# Patient Record
Sex: Female | Born: 1970 | Race: White | Hispanic: No | Smoking: Never smoker
Health system: Southern US, Community
[De-identification: ages and names within clinical notes are randomized; demographics above are authoritative.]

## PROBLEM LIST (undated history)

## (undated) DIAGNOSIS — R51 Headache: Secondary | ICD-10-CM

## (undated) DIAGNOSIS — Z1371 Encounter for nonprocreative screening for genetic disease carrier status: Secondary | ICD-10-CM

## (undated) DIAGNOSIS — F988 Other specified behavioral and emotional disorders with onset usually occurring in childhood and adolescence: Secondary | ICD-10-CM

## (undated) DIAGNOSIS — K5792 Diverticulitis of intestine, part unspecified, without perforation or abscess without bleeding: Secondary | ICD-10-CM

## (undated) DIAGNOSIS — Z803 Family history of malignant neoplasm of breast: Secondary | ICD-10-CM

## (undated) DIAGNOSIS — K219 Gastro-esophageal reflux disease without esophagitis: Secondary | ICD-10-CM

## (undated) DIAGNOSIS — F419 Anxiety disorder, unspecified: Secondary | ICD-10-CM

## (undated) DIAGNOSIS — G4730 Sleep apnea, unspecified: Secondary | ICD-10-CM

## (undated) DIAGNOSIS — K635 Polyp of colon: Secondary | ICD-10-CM

## (undated) DIAGNOSIS — F329 Major depressive disorder, single episode, unspecified: Secondary | ICD-10-CM

## (undated) DIAGNOSIS — F32A Depression, unspecified: Secondary | ICD-10-CM

## (undated) DIAGNOSIS — D649 Anemia, unspecified: Secondary | ICD-10-CM

## (undated) DIAGNOSIS — R519 Headache, unspecified: Secondary | ICD-10-CM

## (undated) DIAGNOSIS — G43909 Migraine, unspecified, not intractable, without status migrainosus: Secondary | ICD-10-CM

## (undated) DIAGNOSIS — K429 Umbilical hernia without obstruction or gangrene: Secondary | ICD-10-CM

## (undated) DIAGNOSIS — K5790 Diverticulosis of intestine, part unspecified, without perforation or abscess without bleeding: Secondary | ICD-10-CM

## (undated) DIAGNOSIS — Z9889 Other specified postprocedural states: Secondary | ICD-10-CM

## (undated) DIAGNOSIS — Z87898 Personal history of other specified conditions: Secondary | ICD-10-CM

## (undated) DIAGNOSIS — K589 Irritable bowel syndrome without diarrhea: Secondary | ICD-10-CM

## (undated) DIAGNOSIS — K56609 Unspecified intestinal obstruction, unspecified as to partial versus complete obstruction: Secondary | ICD-10-CM

## (undated) DIAGNOSIS — G8929 Other chronic pain: Secondary | ICD-10-CM

## (undated) DIAGNOSIS — M4316 Spondylolisthesis, lumbar region: Secondary | ICD-10-CM

## (undated) DIAGNOSIS — M5030 Other cervical disc degeneration, unspecified cervical region: Secondary | ICD-10-CM

## (undated) DIAGNOSIS — I10 Essential (primary) hypertension: Secondary | ICD-10-CM

## (undated) DIAGNOSIS — J189 Pneumonia, unspecified organism: Secondary | ICD-10-CM

## (undated) DIAGNOSIS — R112 Nausea with vomiting, unspecified: Secondary | ICD-10-CM

## (undated) DIAGNOSIS — M1990 Unspecified osteoarthritis, unspecified site: Secondary | ICD-10-CM

## (undated) HISTORY — DX: Family history of malignant neoplasm of breast: Z80.3

## (undated) HISTORY — DX: Headache: R51

## (undated) HISTORY — DX: Diverticulitis of intestine, part unspecified, without perforation or abscess without bleeding: K57.92

## (undated) HISTORY — PX: CARPAL TUNNEL RELEASE: SHX101

## (undated) HISTORY — DX: Encounter for nonprocreative screening for genetic disease carrier status: Z13.71

## (undated) HISTORY — PX: WRIST SURGERY: SHX841

## (undated) HISTORY — DX: Headache, unspecified: R51.9

## (undated) HISTORY — DX: Polyp of colon: K63.5

## (undated) HISTORY — PX: HAND SURGERY: SHX662

## (undated) HISTORY — PX: OTHER SURGICAL HISTORY: SHX169

## (undated) HISTORY — DX: Unspecified intestinal obstruction, unspecified as to partial versus complete obstruction: K56.609

## (undated) HISTORY — DX: Other chronic pain: G89.29

## (undated) HISTORY — PX: COLONOSCOPY: SHX174

---

## 2000-11-08 HISTORY — PX: ABDOMINAL HYSTERECTOMY: SHX81

## 2001-06-07 ENCOUNTER — Ambulatory Visit (HOSPITAL_BASED_OUTPATIENT_CLINIC_OR_DEPARTMENT_OTHER): Admission: RE | Admit: 2001-06-07 | Discharge: 2001-06-07 | Payer: Self-pay | Admitting: Orthopedic Surgery

## 2001-06-12 ENCOUNTER — Emergency Department (HOSPITAL_COMMUNITY): Admission: EM | Admit: 2001-06-12 | Discharge: 2001-06-12 | Payer: Self-pay | Admitting: Emergency Medicine

## 2005-02-08 ENCOUNTER — Emergency Department: Payer: Self-pay | Admitting: Emergency Medicine

## 2005-03-17 ENCOUNTER — Emergency Department: Payer: Self-pay | Admitting: Emergency Medicine

## 2005-05-17 ENCOUNTER — Emergency Department: Payer: Self-pay | Admitting: Emergency Medicine

## 2006-04-07 ENCOUNTER — Emergency Department: Payer: Self-pay | Admitting: Emergency Medicine

## 2006-06-03 ENCOUNTER — Emergency Department: Payer: Self-pay | Admitting: Emergency Medicine

## 2006-08-27 ENCOUNTER — Emergency Department: Payer: Self-pay | Admitting: Emergency Medicine

## 2009-02-13 HISTORY — PX: ESOPHAGOGASTRODUODENOSCOPY: SHX1529

## 2011-01-21 ENCOUNTER — Emergency Department: Payer: Self-pay | Admitting: Emergency Medicine

## 2011-09-16 ENCOUNTER — Emergency Department: Payer: Self-pay | Admitting: Emergency Medicine

## 2012-04-05 ENCOUNTER — Emergency Department (HOSPITAL_BASED_OUTPATIENT_CLINIC_OR_DEPARTMENT_OTHER): Admission: EM | Admit: 2012-04-05 | Discharge: 2012-04-05 | Discharge status: Against Medical Advice | Payer: Self-pay

## 2012-04-05 ENCOUNTER — Emergency Department (INDEPENDENT_AMBULATORY_CARE_PROVIDER_SITE_OTHER): Payer: Self-pay

## 2012-04-05 ENCOUNTER — Encounter (HOSPITAL_BASED_OUTPATIENT_CLINIC_OR_DEPARTMENT_OTHER): Payer: Self-pay

## 2012-04-05 ENCOUNTER — Emergency Department (HOSPITAL_BASED_OUTPATIENT_CLINIC_OR_DEPARTMENT_OTHER)
Admission: EM | Admit: 2012-04-05 | Discharge: 2012-04-05 | Disposition: A | Discharge status: Home / Self Care | Payer: Self-pay | Attending: Emergency Medicine | Admitting: Emergency Medicine

## 2012-04-05 DIAGNOSIS — M542 Cervicalgia: Secondary | ICD-10-CM | POA: Insufficient documentation

## 2012-04-05 DIAGNOSIS — J45909 Unspecified asthma, uncomplicated: Secondary | ICD-10-CM | POA: Insufficient documentation

## 2012-04-05 DIAGNOSIS — I10 Essential (primary) hypertension: Secondary | ICD-10-CM | POA: Insufficient documentation

## 2012-04-05 DIAGNOSIS — M5030 Other cervical disc degeneration, unspecified cervical region: Secondary | ICD-10-CM

## 2012-04-05 DIAGNOSIS — S161XXA Strain of muscle, fascia and tendon at neck level, initial encounter: Secondary | ICD-10-CM

## 2012-04-05 DIAGNOSIS — M436 Torticollis: Secondary | ICD-10-CM

## 2012-04-05 DIAGNOSIS — X58XXXA Exposure to other specified factors, initial encounter: Secondary | ICD-10-CM | POA: Insufficient documentation

## 2012-04-05 HISTORY — DX: Gastro-esophageal reflux disease without esophagitis: K21.9

## 2012-04-05 HISTORY — DX: Depression, unspecified: F32.A

## 2012-04-05 HISTORY — DX: Essential (primary) hypertension: I10

## 2012-04-05 HISTORY — DX: Anxiety disorder, unspecified: F41.9

## 2012-04-05 HISTORY — DX: Major depressive disorder, single episode, unspecified: F32.9

## 2012-04-05 HISTORY — DX: Diverticulosis of intestine, part unspecified, without perforation or abscess without bleeding: K57.90

## 2012-04-05 HISTORY — DX: Irritable bowel syndrome, unspecified: K58.9

## 2012-04-05 MED ORDER — HYDROCODONE-ACETAMINOPHEN 5-500 MG PO TABS: 1.0000 | ORAL_TABLET | Freq: Four times a day (QID) | ORAL | Status: AC | PRN

## 2012-04-05 MED ORDER — CYCLOBENZAPRINE HCL 5 MG PO TABS: 5.0000 mg | ORAL_TABLET | Freq: Two times a day (BID) | ORAL | Status: AC | PRN

## 2012-04-05 NOTE — Discharge Instructions (Signed)

## 2012-04-05 NOTE — ED Provider Notes (Signed)
History     CSN: [id number]  Arrival date & time 04/05/12  1440   First MD Initiated Contact with Patient 04/05/12 1508      Chief Complaint  Patient presents with  . Neck Pain    (Consider location/radiation/quality/duration/timing/severity/associated sxs/prior treatment) HPI Comments: Pt states that she had a neck injury several years ago and she had had a flare up recently  Patient is a 41 y.o. female presenting with neck pain. The history is provided by the patient. No language interpreter was used.  Neck Pain  This is a recurrent problem. The current episode started more than 1 week ago. The problem occurs constantly. The problem has not changed since onset.The pain is associated with nothing. There has been no fever. The pain is present in the generalized neck. The quality of the pain is described as aching. The pain is moderate. The symptoms are aggravated by bending and twisting. The pain is the same all the time. Pertinent negatives include no numbness, no tingling and no weakness.    Past Medical History  Diagnosis Date  . Hypertension   . Asthma   . Depression   . Anxiety   . GERD (gastroesophageal reflux disease)   . IBS (irritable bowel syndrome)   . Diverticulosis     Past Surgical History  Procedure Date  . Wrist surgery   . Abdominal hysterectomy   . Cesarean section     No family history on file.  History  Substance Use Topics  . Smoking status: Never Smoker   . Smokeless tobacco: Not on file  . Alcohol Use: No    OB History    Grav Para Term Preterm Abortions TAB SAB Ect Mult Living                  Review of Systems  Constitutional: Negative.   HENT: Positive for neck pain.   Eyes: Negative.   Respiratory: Negative.   Cardiovascular: Negative.   Neurological: Negative for tingling, weakness and numbness.    Allergies  Morphine and related  Home Medications   Current Outpatient Rx  Name Route Sig Dispense Refill  . ALBUTEROL  SULFATE IN Inhalation Inhale into the lungs.    . BUPROPION HCL ER (XL) 300 MG PO TB24 Oral Take 300 mg by mouth daily.    Marland Kitchen LOSARTAN POTASSIUM-HCTZ 50-12.5 MG PO TABS Oral Take 1 tablet by mouth daily.    . METHYLPHENIDATE HCL ER 54 MG PO TBCR Oral Take 54 mg by mouth every morning.    Marland Kitchen ONDANSETRON 4 MG PO TBDP Oral Take 4 mg by mouth every 8 (eight) hours as needed.    Marland Kitchen ONDANSETRON 8 MG PO TBDP Oral Take 8 mg by mouth every 8 (eight) hours as needed.      BP 134/85  Pulse 87  Temp(Src) 98.2 F (36.8 C) (Oral)  Resp 16  Ht 4\' 11"  (1.499 m)  Wt 165 lb (74.844 kg)  BMI 33.33 kg/m2  SpO2 100%  Physical Exam  Nursing note and vitals reviewed. Constitutional: She is oriented to person, place, and time. She appears well-developed and well-nourished.  HENT:  Head: Normocephalic and atraumatic.  Eyes: Conjunctivae and EOM are normal. Pupils are equal, round, and reactive to light.  Neck: Normal range of motion. Neck supple.  Cardiovascular: Normal rate and regular rhythm.   Pulmonary/Chest: Effort normal and breath sounds normal.  Musculoskeletal: Normal range of motion.       Cervical back: She exhibits  tenderness and bony tenderness. She exhibits normal range of motion.  Neurological: She is oriented to person, place, and time.  Skin: Skin is warm and dry.    ED Course  Procedures (including critical care time)  Labs Reviewed - No data to display Dg Cervical Spine Complete  04/05/2012  *RADIOLOGY REPORT*  Clinical Data: Posterior neck pain and stiffness, no acute injury  CERVICAL SPINE - COMPLETE 4+ VIEW  Comparison: None.  Findings: The cervical vertebrae are slightly straightened in alignment.  There is degenerative disc disease at C6-7 where there is loss of disc space and spurring with sclerosis.  Very slight loss of disc space is noted at C5-6.  There is minimal anterolisthesis of C4 on C5 of questionable significance. There is some foraminal narrowing the C6-7 level.  The  odontoid process is intact.  The lung apices are clear.  IMPRESSION: Slightly straightened alignment with degenerative disc disease particularly at C6-7 with some foraminal narrowing at this level.  Original Report Authenticated By: Juline Patch, M.D.     1. Cervical strain       MDM  Pt not having any neuro deficits;pt is okay to follow up for continued symptoms:will treat symptomatically        Teressa Lower, NP 04/05/12 1554

## 2012-04-05 NOTE — ED Notes (Signed)
C/o neckpain x "couple weeks"-worse since 12pm-hx of "neck sprain in 2008"-no recent injury

## 2012-04-05 NOTE — ED Provider Notes (Signed)
Medical screening examination/treatment/procedure(s) were performed by non-physician practitioner and as supervising physician I was immediately available for consultation/collaboration.   Casmer Yepiz, MD 04/05/12 1751 

## 2013-01-27 DIAGNOSIS — Z1371 Encounter for nonprocreative screening for genetic disease carrier status: Secondary | ICD-10-CM

## 2013-01-27 HISTORY — DX: Encounter for nonprocreative screening for genetic disease carrier status: Z13.71

## 2015-11-10 ENCOUNTER — Other Ambulatory Visit (HOSPITAL_COMMUNITY): Payer: Self-pay | Admitting: Neurological Surgery

## 2015-12-03 NOTE — Pre-Procedure Instructions (Signed)
Christina Potts  12/03/2015     Your procedure is scheduled on : Wednesday December 10, 2015 at 1:00 PM.  [facility] at 11:00 AM.  Call this number if you have problems the morning of surgery: [phone]    Remember:  Do not eat food or drink liquids after midnight.  Take these medicines the morning of surgery with A SIP OF WATER : Bupropion (Wellbutrin), Diazepam (Valium) if needed, Oxycodone if needed   Stop taking any vitamins, herbal medications, Ibuprofen, Advil, Motrin, Aleve, etc   Do not wear jewelry, make-up or nail polish.  Do not wear lotions, powders, or perfumes.    Do not shave 48 hours prior to surgery.    Do not bring valuables to the hospital.  [facility] is not responsible for any belongings or valuables.  Contacts, dentures or bridgework may not be worn into surgery.  Leave your suitcase in the car.  After surgery it may be brought to your room.  For patients admitted to the hospital, discharge time will be determined by your treatment team.  Patients discharged the day of surgery will not be allowed to drive home.   Name and phone number of your driver:    Special instructions:  Shower using CHG soap the night before and the morning of your surgery  Please read over the following fact sheets that you were given. Pain Booklet, Coughing and Deep Breathing, MRSA Information and Surgical Site Infection Prevention

## 2015-12-04 ENCOUNTER — Encounter (HOSPITAL_BASED_OUTPATIENT_CLINIC_OR_DEPARTMENT_OTHER): Payer: Self-pay

## 2015-12-04 ENCOUNTER — Encounter (HOSPITAL_COMMUNITY)
Admission: RE | Admit: 2015-12-04 | Discharge: 2015-12-04 | Disposition: A | Discharge status: Home / Self Care | Payer: Managed Care, Other (non HMO) | Source: Ambulatory Visit | Attending: Neurological Surgery | Admitting: Neurological Surgery

## 2015-12-04 ENCOUNTER — Ambulatory Visit (HOSPITAL_COMMUNITY)
Admission: RE | Admit: 2015-12-04 | Discharge: 2015-12-04 | Disposition: A | Discharge status: Home / Self Care | Payer: Managed Care, Other (non HMO) | Source: Ambulatory Visit | Attending: Neurological Surgery | Admitting: Neurological Surgery

## 2015-12-04 ENCOUNTER — Encounter (HOSPITAL_COMMUNITY): Payer: Self-pay

## 2015-12-04 DIAGNOSIS — Z01818 Encounter for other preprocedural examination: Secondary | ICD-10-CM | POA: Insufficient documentation

## 2015-12-04 DIAGNOSIS — M479 Spondylosis, unspecified: Secondary | ICD-10-CM | POA: Insufficient documentation

## 2015-12-04 DIAGNOSIS — R9431 Abnormal electrocardiogram [ECG] [EKG]: Secondary | ICD-10-CM | POA: Insufficient documentation

## 2015-12-04 DIAGNOSIS — Z01812 Encounter for preprocedural laboratory examination: Secondary | ICD-10-CM | POA: Diagnosis not present

## 2015-12-04 DIAGNOSIS — Z01810 Encounter for preprocedural cardiovascular examination: Secondary | ICD-10-CM | POA: Insufficient documentation

## 2015-12-04 DIAGNOSIS — M5030 Other cervical disc degeneration, unspecified cervical region: Secondary | ICD-10-CM | POA: Diagnosis not present

## 2015-12-04 HISTORY — DX: Other specified behavioral and emotional disorders with onset usually occurring in childhood and adolescence: F98.8

## 2015-12-04 HISTORY — DX: Unspecified osteoarthritis, unspecified site: M19.90

## 2015-12-04 HISTORY — DX: Nausea with vomiting, unspecified: R11.2

## 2015-12-04 HISTORY — DX: Personal history of other specified conditions: Z87.898

## 2015-12-04 HISTORY — DX: Other specified postprocedural states: Z98.890

## 2015-12-04 LAB — BASIC METABOLIC PANEL
Anion gap: 8 (ref 5–15)
BUN: 12 mg/dL (ref 6–20)
CALCIUM: 9.1 mg/dL (ref 8.9–10.3)
CO2: 29 mmol/L (ref 22–32)
CREATININE: 1.01 mg/dL — AB (ref 0.44–1.00)
Chloride: 102 mmol/L (ref 101–111)
GFR calc non Af Amer: >60 mL/min (ref >60)
Glucose, Bld: 94 mg/dL (ref 65–99)
Potassium: 3.8 mmol/L (ref 3.5–5.1)
SODIUM: 139 mmol/L (ref 135–145)

## 2015-12-04 LAB — CBC WITH DIFFERENTIAL/PLATELET
BASOS PCT: 0 %
Basophils Absolute: 0 10*3/uL (ref 0.0–0.1)
EOS ABS: 0.1 10*3/uL (ref 0.0–0.7)
Eosinophils Relative: 1 %
HCT: 44 % (ref 36.0–46.0)
HEMOGLOBIN: 14.3 g/dL (ref 12.0–15.0)
Lymphocytes Relative: 26 %
Lymphs Abs: 1.6 10*3/uL (ref 0.7–4.0)
MCH: 30.4 pg (ref 26.0–34.0)
MCHC: 32.5 g/dL (ref 30.0–36.0)
MCV: 93.6 fL (ref 78.0–100.0)
Monocytes Absolute: 0.4 10*3/uL (ref 0.1–1.0)
Monocytes Relative: 7 %
NEUTROS PCT: 66 %
Neutro Abs: 4.1 10*3/uL (ref 1.7–7.7)
Platelets: 254 10*3/uL (ref 150–400)
RBC: 4.7 MIL/uL (ref 3.87–5.11)
RDW: 12.5 % (ref 11.5–15.5)
WBC: 6.3 10*3/uL (ref 4.0–10.5)

## 2015-12-04 LAB — SURGICAL PCR SCREEN
MRSA, PCR: NEGATIVE
Staphylococcus aureus: NEGATIVE

## 2015-12-04 LAB — PROTIME-INR
INR: 1.15 (ref 0.00–1.49)
PROTHROMBIN TIME: 14.9 s (ref 11.6–15.2)

## 2015-12-04 NOTE — Progress Notes (Signed)
Patient arrived to scheduled 0900 PAT appointment at 0939  PCP is Joen LauraLaura Bliss  Patient denied having any acute cardiac or pulmonary issues, but did inform Nurse that she had a cold "the week after Thanksgiving." Patient stated that she went to the doctor and was given a Z-Pack and she "feels better now." Patient last Albuterol inhaler when she had a cold a few weeks ago.

## 2015-12-05 ENCOUNTER — Encounter (HOSPITAL_COMMUNITY): Payer: Self-pay | Admitting: Emergency Medicine

## 2015-12-05 NOTE — Progress Notes (Signed)
Anesthesia Chart Review:  Pt is 45 year old female scheduled for 3 level ACDF on 12/10/2015 with Dr. Yetta BarreJones.   PMH includes:  HTN, asthma, GERD, post-op N/V. Never smoker. BMI 28.5.   Medications include: albuterol, losartan-hctz, methylphenidate.   Preoperative labs reviewed.    Chest x-ray 12/04/15 reviewed. No active cardiopulmonary disease.   EKG 12/04/15: NSR. Cannot rule out Anterior infarct, age undetermined (abnormal R wave progression may be related to lead Placement) per Dr. Erin Hearingroitoru's interpretation.   If no changes, I anticipate pt can proceed with surgery as scheduled.   Rica Mastngela Chaysen Tillman, FNP-BC [facility]Anesthesiology Phone: [phone] 12/05/2015 1:38 PM

## 2015-12-09 MED ORDER — CEFAZOLIN SODIUM-DEXTROSE 2-3 GM-% IV SOLR: 2.0000 g | INTRAVENOUS | Status: DC

## 2015-12-09 MED ORDER — DEXAMETHASONE SODIUM PHOSPHATE 10 MG/ML IJ SOLN: 10.0000 mg | INTRAMUSCULAR | Status: DC

## 2015-12-10 ENCOUNTER — Inpatient Hospital Stay (HOSPITAL_COMMUNITY)
Admission: RE | Admit: 2015-12-10 | Payer: Managed Care, Other (non HMO) | Source: Ambulatory Visit | Admitting: Neurological Surgery

## 2015-12-10 ENCOUNTER — Encounter (HOSPITAL_COMMUNITY): Admission: RE | Payer: Self-pay | Source: Ambulatory Visit

## 2015-12-10 SURGERY — ANTERIOR CERVICAL DECOMPRESSION/DISCECTOMY FUSION 3 LEVELS
Anesthesia: General

## 2015-12-15 ENCOUNTER — Other Ambulatory Visit: Payer: Self-pay | Admitting: Neurological Surgery

## 2015-12-15 DIAGNOSIS — M542 Cervicalgia: Secondary | ICD-10-CM

## 2015-12-17 ENCOUNTER — Inpatient Hospital Stay: Admission: RE | Admit: 2015-12-17 | Payer: Self-pay | Source: Ambulatory Visit

## 2015-12-19 ENCOUNTER — Other Ambulatory Visit: Payer: Self-pay | Admitting: Neurological Surgery

## 2015-12-19 DIAGNOSIS — M542 Cervicalgia: Secondary | ICD-10-CM

## 2015-12-24 ENCOUNTER — Ambulatory Visit
Admission: RE | Admit: 2015-12-24 | Discharge: 2015-12-24 | Disposition: A | Discharge status: Home / Self Care | Payer: 59 | Source: Ambulatory Visit | Attending: Neurological Surgery | Admitting: Neurological Surgery

## 2015-12-24 DIAGNOSIS — M542 Cervicalgia: Secondary | ICD-10-CM

## 2015-12-24 MED ORDER — DIAZEPAM 5 MG PO TABS
10.0000 mg | ORAL_TABLET | Freq: Once | ORAL | Status: AC
  Administered 2015-12-24: 10 mg via ORAL

## 2015-12-24 MED ORDER — MEPERIDINE HCL 100 MG/ML IJ SOLN
75.0000 mg | Freq: Once | INTRAMUSCULAR | Status: AC
  Administered 2015-12-24: 75 mg via INTRAMUSCULAR

## 2015-12-24 MED ORDER — ONDANSETRON HCL 4 MG/2ML IJ SOLN
4.0000 mg | Freq: Once | INTRAMUSCULAR | Status: AC
  Administered 2015-12-24: 4 mg via INTRAMUSCULAR

## 2015-12-24 MED ORDER — IOHEXOL 300 MG/ML  SOLN
10.0000 mL | Freq: Once | INTRAMUSCULAR | Status: AC | PRN
Start: 2015-12-24 — End: 2015-12-24
  Administered 2015-12-24: 10 mL via INTRATHECAL

## 2015-12-24 NOTE — Discharge Instructions (Addendum)
Myelogram Discharge Instructions  1. Go home and rest quietly for the next 24 hours.  It is important to lie flat for the next 24 hours.  Get up only to go to the restroom.  You may lie in the bed or on a couch on your back, your stomach, your left side or your right side.  You may have one pillow under your head.  You may have pillows between your knees while you are on your side or under your knees while you are on your back.  2. DO NOT drive today.  Recline the seat as far back as it will go, while still wearing your seat belt, on the way home.  3. You may get up to go to the bathroom as needed.  You may sit up for 10 minutes to eat.  You may resume your normal diet and medications unless otherwise indicated.  Drink lots of extra fluids today and tomorrow.  4. The incidence of headache, nausea, or vomiting is about 5% (one in 20 patients).  If you develop a headache, lie flat and drink plenty of fluids until the headache goes away.  Caffeinated beverages may be helpful.  If you develop severe nausea and vomiting or a headache that does not go away with flat bed rest, call [phone].  5. You may resume normal activities after your 24 hours of bed rest is over; however, do not exert yourself strongly or do any heavy lifting tomorrow. If when you get up you have a headache when standing, go back to bed and force fluids for another 24 hours.  6. Call your physician for a follow-up appointment.  The results of your myelogram will be sent directly to your physician by the following day.  7. If you have any questions or if complications develop after you arrive home, please call [phone].  Discharge instructions have been explained to the patient.  The patient, or the person responsible for the patient, fully understands these instructions.      You may resume and Ritalin and Wellbutrin on Jan. 26, 2017, after 1:00 pm.

## 2015-12-24 NOTE — Progress Notes (Signed)
Patient states she has been off Ritalin and Wellbutrin for at least the past two days.

## 2016-01-26 ENCOUNTER — Encounter (HOSPITAL_COMMUNITY): Payer: Self-pay | Admitting: *Deleted

## 2016-01-26 ENCOUNTER — Inpatient Hospital Stay (HOSPITAL_COMMUNITY): Admission: RE | Admit: 2016-01-26 | Payer: Self-pay | Source: Ambulatory Visit

## 2016-01-28 ENCOUNTER — Inpatient Hospital Stay (HOSPITAL_COMMUNITY)
Admission: RE | Admit: 2016-01-28 | Payer: Managed Care, Other (non HMO) | Source: Ambulatory Visit | Admitting: Neurological Surgery

## 2016-01-28 SURGERY — ANTERIOR CERVICAL DECOMPRESSION/DISCECTOMY FUSION 3 LEVELS
Anesthesia: General

## 2017-04-21 ENCOUNTER — Emergency Department (HOSPITAL_BASED_OUTPATIENT_CLINIC_OR_DEPARTMENT_OTHER): Payer: 59

## 2017-04-21 ENCOUNTER — Encounter (HOSPITAL_BASED_OUTPATIENT_CLINIC_OR_DEPARTMENT_OTHER): Payer: Self-pay | Admitting: Emergency Medicine

## 2017-04-21 ENCOUNTER — Emergency Department (HOSPITAL_BASED_OUTPATIENT_CLINIC_OR_DEPARTMENT_OTHER)
Admission: EM | Admit: 2017-04-21 | Discharge: 2017-04-21 | Disposition: A | Discharge status: Home / Self Care | Payer: 59 | Attending: Emergency Medicine | Admitting: Emergency Medicine

## 2017-04-21 DIAGNOSIS — I10 Essential (primary) hypertension: Secondary | ICD-10-CM | POA: Diagnosis not present

## 2017-04-21 DIAGNOSIS — Z79899 Other long term (current) drug therapy: Secondary | ICD-10-CM | POA: Insufficient documentation

## 2017-04-21 DIAGNOSIS — M5414 Radiculopathy, thoracic region: Secondary | ICD-10-CM

## 2017-04-21 DIAGNOSIS — J45909 Unspecified asthma, uncomplicated: Secondary | ICD-10-CM | POA: Insufficient documentation

## 2017-04-21 DIAGNOSIS — R202 Paresthesia of skin: Secondary | ICD-10-CM | POA: Diagnosis present

## 2017-04-21 HISTORY — DX: Migraine, unspecified, not intractable, without status migrainosus: G43.909

## 2017-04-21 HISTORY — DX: Other cervical disc degeneration, unspecified cervical region: M50.30

## 2017-04-21 LAB — CBC WITH DIFFERENTIAL/PLATELET
Basophils Absolute: 0 10*3/uL (ref 0.0–0.1)
Basophils Relative: 1 %
EOS ABS: 0.1 10*3/uL (ref 0.0–0.7)
EOS PCT: 1 %
HCT: 46.2 % — ABNORMAL HIGH (ref 36.0–46.0)
HEMOGLOBIN: 15.7 g/dL — AB (ref 12.0–15.0)
LYMPHS ABS: 1.8 10*3/uL (ref 0.7–4.0)
Lymphocytes Relative: 21 %
MCH: 31 pg (ref 26.0–34.0)
MCHC: 34 g/dL (ref 30.0–36.0)
MCV: 91.1 fL (ref 78.0–100.0)
MONO ABS: 0.4 10*3/uL (ref 0.1–1.0)
MONOS PCT: 5 %
NEUTROS PCT: 72 %
Neutro Abs: 6.2 10*3/uL (ref 1.7–7.7)
Platelets: 303 10*3/uL (ref 150–400)
RBC: 5.07 MIL/uL (ref 3.87–5.11)
RDW: 12.5 % (ref 11.5–15.5)
WBC: 8.6 10*3/uL (ref 4.0–10.5)

## 2017-04-21 LAB — BASIC METABOLIC PANEL
ANION GAP: 9 (ref 5–15)
BUN: 13 mg/dL (ref 6–20)
CALCIUM: 9 mg/dL (ref 8.9–10.3)
CO2: 28 mmol/L (ref 22–32)
Chloride: 98 mmol/L — ABNORMAL LOW (ref 101–111)
Creatinine, Ser: 0.9 mg/dL (ref 0.44–1.00)
GFR calc Af Amer: >60 mL/min (ref >60)
GFR calc non Af Amer: >60 mL/min (ref >60)
Glucose, Bld: 107 mg/dL — ABNORMAL HIGH (ref 65–99)
Potassium: 3.2 mmol/L — ABNORMAL LOW (ref 3.5–5.1)
Sodium: 135 mmol/L (ref 135–145)

## 2017-04-21 LAB — D-DIMER, QUANTITATIVE (NOT AT ARMC): D DIMER QUANT: 0.27 ug{FEU}/mL (ref 0.00–0.50)

## 2017-04-21 LAB — TROPONIN I: Troponin I: <0.03 ng/mL (ref <0.03)

## 2017-04-21 MED ORDER — ONDANSETRON HCL 4 MG/2ML IJ SOLN
4.0000 mg | Freq: Once | INTRAMUSCULAR | Status: AC
Start: 2017-04-21 — End: 2017-04-21
  Administered 2017-04-21: 4 mg via INTRAVENOUS
  Filled 2017-04-21: qty 2

## 2017-04-21 NOTE — ED Triage Notes (Signed)
Pt is moving all extremities. Pt reports hx of migraine HA.

## 2017-04-21 NOTE — ED Notes (Signed)
Pt returned from xray

## 2017-04-21 NOTE — ED Notes (Signed)
Pt drove self to ED from work.

## 2017-04-21 NOTE — ED Provider Notes (Signed)
[facility] Provider Note: Lowella Dell, MD, FACEP  CSN: [id number] MRN: [id number] ARRIVAL: 04/21/17 at 0201 ROOM: MH02/MH02   CHIEF COMPLAINT  Numbness   HISTORY OF PRESENT ILLNESS  Christina Potts is a 46 y.o. female who complains of pain in her left upper back that began yesterday. His pain is sharp and worse with deep breathing. She describes the pain is deep not superficial. It is not changed with palpation. She localizes the pain just inferior to the left scapula. About 1 AM this morning at work she began to "feel funny". She developed numbness of the left cheek, pain in the left side of her neck, pain and paresthesias down her left arm in approximately the T1 dermatome. She rates her pain as a 5 out of 10. She describes it as feeling like her blood is too thick to move through her veins. Her pain and paresthesias are not changed with movement of the neck or of the left shoulder. She states she is nauseated and feels anxious. She is not currently short of breath but does not want to take a deep breath due to the pain in her back. She has not vomited. She has no focal weakness.    Past Medical History:  Diagnosis Date  . ADD (attention deficit disorder)   . Anxiety   . Arthritis   . Asthma   . Asthma   . DDD (degenerative disc disease), cervical   . Depression   . Diverticulosis   . GERD (gastroesophageal reflux disease)   . History of snoring   . Hypertension   . IBS (irritable bowel syndrome)   . Migraines   . PONV (postoperative nausea and vomiting)     Past Surgical History:  Procedure Laterality Date  . ABDOMINAL HYSTERECTOMY    . CARPAL TUNNEL RELEASE Left   . CESAREAN SECTION    . CESAREAN SECTION     X 2  . COLONOSCOPY WITH ESOPHAGOGASTRODUODENOSCOPY (EGD)    . HAND SURGERY Left   . PARTIAL HYSTERECTOMY    . WRIST SURGERY Left    Cartlige bremoved    No family history on file.  Social History  Substance Use Topics  . Smoking status:  Never Smoker  . Smokeless tobacco: Never Used  . Alcohol use Yes     Comment: rare    Prior to Admission medications   Medication Sig Start Date End Date Taking? Authorizing Provider  albuterol (PROVENTIL HFA;VENTOLIN HFA) 108 (90 Base) MCG/ACT inhaler Inhale 2 puffs into the lungs every 4 (four) hours as needed for wheezing or shortness of breath.    [provider]  buPROPion (WELLBUTRIN XL) 300 MG 24 hr tablet Take 300 mg by mouth daily.    [provider]  diazepam (VALIUM) 5 MG tablet Take 5 mg by mouth 3 (three) times daily as needed for muscle spasms.  11/14/15   [provider]  losartan-hydrochlorothiazide (HYZAAR) 50-12.5 MG per tablet Take 1 tablet by mouth daily.    [provider]  methylphenidate (RITALIN) 10 MG tablet Take 20 mg by mouth 3 (three) times daily with meals.    [provider]  ondansetron (ZOFRAN-ODT) 4 MG disintegrating tablet Take 4 mg by mouth every 8 (eight) hours as needed.    [provider]  oxyCODONE-acetaminophen (PERCOCET/ROXICET) 5-325 MG tablet Take 1 tablet by mouth every 6 (six) hours as needed for moderate pain.  10/22/15   [provider]    Allergies Dilaudid [  hydromorphone hcl]; Strawberry (diagnostic); and Morphine and related   REVIEW OF SYSTEMS  Negative except as noted here or in the History of Present Illness.   PHYSICAL EXAMINATION  Initial Vital Signs Blood pressure (!) 143/97, pulse (!) 102, temperature 97.9 F (36.6 C), temperature source Oral, resp. rate 20, height 5' (1.524 m), weight 81.6 kg (180 lb), SpO2 100 %.  Examination General: Well-developed, well-nourished female in no acute distress; appearance consistent with age of record HENT: normocephalic; atraumatic Eyes: pupils equal, round and reactive to light; extraocular muscles intact Neck: supple; no change in patient's pain with movement of the neck Heart: regular rate and rhythm Lungs: clear to  auscultation bilaterally Abdomen: soft; nondistended; nontender; no masses or hepatosplenomegaly; bowel sounds present Back: No tenderness at site of reported pain Extremities: No deformity; full range of motion; pulses normal Neurologic: Awake, alert and oriented; motor function intact in all extremities and symmetric; subjectively altered sensation of left cheek and left upper extremity in about the T1 dermatome; no facial droop; tongue midline; normal coordination and speech; negative Romberg; normal finger to nose Skin: Warm and dry Psychiatric: Anxious with congruent affect   RESULTS  Summary of this visit's results, reviewed by myself:   EKG Interpretation  Date/Time:  Thursday Apr 21 2017 02:12:13 EDT Ventricular Rate:  93 PR Interval:    QRS Duration: 86 QT Interval:  362 QTC Calculation: 451 R Axis:   66 Text Interpretation:  Normal sinus rhythm Normal ECG No significant change was found Confirmed by Elexa Kivi, Jonny Ruiz ([id number]) on 04/21/2017 2:14:42 AM      Laboratory Studies: Results for orders placed or performed during the hospital encounter of 04/21/17 (from the past 24 hour(s))  CBC with Differential/Platelet     Status: Abnormal   Collection Time: 04/21/17  2:20 AM  Result Value Ref Range   WBC 8.6 4.0 - 10.5 K/uL   RBC 5.07 3.87 - 5.11 MIL/uL   Hemoglobin 15.7 (H) 12.0 - 15.0 g/dL   HCT 62.6 (H) 94.8 - 54.6 %   MCV 91.1 78.0 - 100.0 fL   MCH 31.0 26.0 - 34.0 pg   MCHC 34.0 30.0 - 36.0 g/dL   RDW 27.0 35.0 - 09.3 %   Platelets 303 150 - 400 K/uL   Neutrophils Relative % 72 %   Neutro Abs 6.2 1.7 - 7.7 K/uL   Lymphocytes Relative 21 %   Lymphs Abs 1.8 0.7 - 4.0 K/uL   Monocytes Relative 5 %   Monocytes Absolute 0.4 0.1 - 1.0 K/uL   Eosinophils Relative 1 %   Eosinophils Absolute 0.1 0.0 - 0.7 K/uL   Basophils Relative 1 %   Basophils Absolute 0.0 0.0 - 0.1 K/uL  D-dimer, quantitative (not at [facility])     Status: None   Collection Time: 04/21/17  2:20 AM  Result  Value Ref Range   D-Dimer, Quant 0.27 0.00 - 0.50 ug/mL-FEU  Troponin I     Status: None   Collection Time: 04/21/17  2:20 AM  Result Value Ref Range   Troponin I <0.03 <0.03 ng/mL  Basic metabolic panel     Status: Abnormal   Collection Time: 04/21/17  2:20 AM  Result Value Ref Range   Sodium 135 135 - 145 mmol/L   Potassium 3.2 (L) 3.5 - 5.1 mmol/L   Chloride 98 (L) 101 - 111 mmol/L   CO2 28 22 - 32 mmol/L   Glucose, Bld 107 (H) 65 - 99 mg/dL   BUN 13  6 - 20 mg/dL   Creatinine, Ser 5.620.90 0.44 - 1.00 mg/dL   Calcium 9.0 8.9 - 13.010.3 mg/dL   GFR calc non Af Amer >60 >60 mL/min   GFR calc Af Amer >60 >60 mL/min   Anion gap 9 5 - 15   Imaging Studies: Dg Chest 2 View  Result Date: 04/21/2017 CLINICAL DATA:  Left face and arm numbness. Left neck pain for 1 hour. Chronic back pain. Some shortness of breath. Nonsmoker. EXAM: CHEST  2 VIEW COMPARISON:  12/04/2015 FINDINGS: The heart size and mediastinal contours are within normal limits. Both lungs are clear. The visualized skeletal structures are unremarkable. IMPRESSION: No active cardiopulmonary disease. Electronically Signed   By: Burman NievesWilliam  Stevens M.D.   On: 04/21/2017 03:10    ED COURSE  Nursing notes and initial vitals signs, including pulse oximetry, reviewed.  Vitals:   04/21/17 0208 04/21/17 0329  BP: (!) 143/97 (!) 149/87  Pulse: (!) 102 78  Resp: 20 14  Temp: 97.9 F (36.6 C)   TempSrc: Oral   SpO2: 100% 100%  Weight: 81.6 kg (180 lb)   Height: 5' (1.524 m)    4:01 AM The patient has known cervical disc disease. A T1 radiculopathy would explain the patient's left upper back and left upper extremity symptoms. It may also explain why symptoms are not exacerbated with movement of the neck is T1 is a thoracic vertebra. This would not explain the paresthesias of the left cheek. Her EKG, chest x-ray and lab studies are all reassuring. Her symptoms have improved since arrival. She was advised follow-up with her primary care  later today. To return for worsening or changing symptoms.  PROCEDURES    ED DIAGNOSES     ICD-9-CM ICD-10-CM   1. Thoracic radiculopathy 724.4 M54.14        Jaston Havens, Jonny RuizJohn, MD 04/21/17 [phone]

## 2017-04-21 NOTE — ED Triage Notes (Signed)
Pt reports numbness in left side of face, pain in neck and back, numbness in left arm. Onset of symptoms 30-40 min PTA.

## 2017-04-21 NOTE — ED Notes (Signed)
ED Provider at bedside. 

## 2017-05-11 ENCOUNTER — Other Ambulatory Visit: Payer: Self-pay | Admitting: Neurological Surgery

## 2017-05-11 DIAGNOSIS — M542 Cervicalgia: Secondary | ICD-10-CM

## 2017-05-31 ENCOUNTER — Ambulatory Visit: Admission: RE | Admit: 2017-05-31 | Payer: 59 | Source: Ambulatory Visit

## 2017-07-06 ENCOUNTER — Emergency Department
Admission: EM | Admit: 2017-07-06 | Discharge: 2017-07-06 | Disposition: A | Discharge status: Home / Self Care | Payer: 59 | Attending: Emergency Medicine | Admitting: Emergency Medicine

## 2017-07-06 ENCOUNTER — Other Ambulatory Visit: Payer: Self-pay | Admitting: Emergency Medicine

## 2017-07-06 ENCOUNTER — Ambulatory Visit
Admission: RE | Admit: 2017-07-06 | Discharge: 2017-07-06 | Disposition: A | Discharge status: Home / Self Care | Payer: 59 | Source: Ambulatory Visit | Attending: Emergency Medicine | Admitting: Emergency Medicine

## 2017-07-06 DIAGNOSIS — S139XXA Sprain of joints and ligaments of unspecified parts of neck, initial encounter: Secondary | ICD-10-CM | POA: Diagnosis not present

## 2017-07-06 DIAGNOSIS — M47892 Other spondylosis, cervical region: Secondary | ICD-10-CM

## 2017-07-06 DIAGNOSIS — M5136 Other intervertebral disc degeneration, lumbar region: Secondary | ICD-10-CM | POA: Insufficient documentation

## 2017-07-06 DIAGNOSIS — M1288 Other specific arthropathies, not elsewhere classified, other specified site: Secondary | ICD-10-CM | POA: Insufficient documentation

## 2017-07-06 DIAGNOSIS — M4316 Spondylolisthesis, lumbar region: Secondary | ICD-10-CM | POA: Insufficient documentation

## 2017-07-06 DIAGNOSIS — M5030 Other cervical disc degeneration, unspecified cervical region: Secondary | ICD-10-CM

## 2017-07-06 DIAGNOSIS — S93402A Sprain of unspecified ligament of left ankle, initial encounter: Secondary | ICD-10-CM | POA: Insufficient documentation

## 2017-07-06 DIAGNOSIS — W108XXA Fall (on) (from) other stairs and steps, initial encounter: Secondary | ICD-10-CM | POA: Insufficient documentation

## 2017-07-06 DIAGNOSIS — Y999 Unspecified external cause status: Secondary | ICD-10-CM | POA: Insufficient documentation

## 2017-07-06 DIAGNOSIS — I2510 Atherosclerotic heart disease of native coronary artery without angina pectoris: Secondary | ICD-10-CM | POA: Insufficient documentation

## 2017-07-06 DIAGNOSIS — Y939 Activity, unspecified: Secondary | ICD-10-CM | POA: Diagnosis not present

## 2017-07-06 DIAGNOSIS — M25572 Pain in left ankle and joints of left foot: Secondary | ICD-10-CM

## 2017-07-06 DIAGNOSIS — M545 Low back pain: Secondary | ICD-10-CM

## 2017-07-06 DIAGNOSIS — M4807 Spinal stenosis, lumbosacral region: Secondary | ICD-10-CM | POA: Insufficient documentation

## 2017-07-06 DIAGNOSIS — S339XXA Sprain of unspecified parts of lumbar spine and pelvis, initial encounter: Secondary | ICD-10-CM | POA: Insufficient documentation

## 2017-07-06 DIAGNOSIS — M79601 Pain in right arm: Secondary | ICD-10-CM

## 2017-07-06 DIAGNOSIS — S335XXA Sprain of ligaments of lumbar spine, initial encounter: Secondary | ICD-10-CM

## 2017-07-06 DIAGNOSIS — Y92009 Unspecified place in unspecified non-institutional (private) residence as the place of occurrence of the external cause: Secondary | ICD-10-CM | POA: Diagnosis not present

## 2017-07-06 DIAGNOSIS — S3992XA Unspecified injury of lower back, initial encounter: Secondary | ICD-10-CM | POA: Diagnosis present

## 2017-07-06 MED ORDER — OXYCODONE-ACETAMINOPHEN 5-325 MG PO TABS
ORAL_TABLET | ORAL | Status: AC
  Filled 2017-07-06: qty 1

## 2017-07-06 NOTE — ED Notes (Signed)
See downtime paper chart

## 2017-07-06 NOTE — ED Notes (Signed)
See downtime charting. 

## 2017-07-08 ENCOUNTER — Other Ambulatory Visit: Payer: Self-pay | Admitting: Neurological Surgery

## 2017-07-08 DIAGNOSIS — M542 Cervicalgia: Secondary | ICD-10-CM

## 2017-07-15 ENCOUNTER — Other Ambulatory Visit: Payer: Self-pay

## 2017-07-15 ENCOUNTER — Ambulatory Visit
Admission: RE | Admit: 2017-07-15 | Discharge: 2017-07-15 | Disposition: A | Discharge status: Home / Self Care | Payer: 59 | Source: Ambulatory Visit | Attending: Neurological Surgery | Admitting: Neurological Surgery

## 2017-07-15 DIAGNOSIS — M542 Cervicalgia: Secondary | ICD-10-CM

## 2017-07-16 NOTE — ED Provider Notes (Addendum)
For scanned Tsheet documentation from 07/06/17, computer downtime.  Medical screening examination/treatment/procedure(s) were performed by non-physician practitioner and as supervising physician I was immediately available for consultation/collaboration.     Governor Rooks, MD 07/16/17 2376    Governor Rooks, MD 07/16/17 [phone]

## 2017-07-21 ENCOUNTER — Other Ambulatory Visit: Payer: Self-pay | Admitting: Student

## 2017-07-21 DIAGNOSIS — M5416 Radiculopathy, lumbar region: Secondary | ICD-10-CM

## 2017-07-22 ENCOUNTER — Ambulatory Visit
Admission: RE | Admit: 2017-07-22 | Discharge: 2017-07-22 | Disposition: A | Discharge status: Home / Self Care | Payer: 59 | Source: Ambulatory Visit | Attending: Student | Admitting: Student

## 2017-07-22 DIAGNOSIS — M5416 Radiculopathy, lumbar region: Secondary | ICD-10-CM

## 2017-07-27 ENCOUNTER — Other Ambulatory Visit: Payer: Self-pay | Admitting: Neurological Surgery

## 2017-08-29 HISTORY — PX: BACK SURGERY: SHX140

## 2017-09-14 NOTE — Pre-Procedure Instructions (Signed)
ANAYELI AREL  09/14/2017      [id number] - [place], [place] - [address] [place] [address] Phone: [phone] Fax: [phone]  [id number] - [place], [place] - [address] AT [facility] [address] [place] [address] [place] [address] Phone: [phone] Fax: [phone]    Your procedure is scheduled on Wednesday, September 21, 2017  Report to [facility] Admitting Entrance "A" at 5:30 A.M.  Call this number if you have problems the morning of surgery:  [phone]   Remember:  Do not eat food or drink liquids after midnight.  Take these medicines the morning of surgery with A SIP OF WATER: BuPROPion (WELLBUTRIN XL). If needed Ondansetron (ZOFRAN-ODT) for nausea, Diazepam (VALIUM) for muscle spasms, and Albuterol inhaler for cough or wheezing (bring with you the day of surgery).  As of today, stop taking all Aspirins, Vitamins, Fish oils, and Herbal medications. Also stop all NSAIDS i.e. Advil, Ibuprofen, Motrin, Aleve, Anaprox, Naproxen, BC and Goody Powders.   Do not wear jewelry, make-up or nail polish.  Do not wear lotions, powders, or perfumes, or deodorant.  Do not shave 48 hours prior to surgery.   Do not bring valuables to the hospital.  [facility] is not responsible for any belongings or valuables.  Contacts, dentures or bridgework may not be worn into surgery.  Leave your suitcase in the car.  After surgery it may be brought to your room.  For patients admitted to the hospital, discharge time will be determined by your treatment team.  Patients discharged the day of surgery will not be allowed to drive home.   Special instructions:   [place]- Preparing For Surgery  Before surgery, you can play an important role. Because skin is not sterile, your skin needs to be as free of germs as possible. You can reduce the number of germs on your skin by washing with CHG (chlorahexidine gluconate) Soap before  surgery.  CHG is an antiseptic cleaner which kills germs and bonds with the skin to continue killing germs even after washing.  Please do not use if you have an allergy to CHG or antibacterial soaps. If your skin becomes reddened/irritated stop using the CHG.  Do not shave (including legs and underarms) for at least 48 hours prior to first CHG shower. It is OK to shave your face.  Please follow these instructions carefully.   1. Shower the NIGHT BEFORE SURGERY and the MORNING OF SURGERY with CHG.   2. If you chose to wash your hair, wash your hair first as usual with your normal shampoo.  3. After you shampoo, rinse your hair and body thoroughly to remove the shampoo.  4. Use CHG as you would any other liquid soap. You can apply CHG directly to the skin and wash gently with a scrungie or a clean washcloth.   5. Apply the CHG Soap to your body ONLY FROM THE NECK DOWN.  Do not use on open wounds or open sores. Avoid contact with your eyes, ears, mouth and genitals (private parts). Wash Face and genitals (private parts)  with your normal soap.  6. Wash thoroughly, paying special attention to the area where your surgery will be performed.  7. Thoroughly rinse your body with warm water from the neck down.  8. DO NOT shower/wash with your normal soap after using and rinsing off the CHG Soap.  9. Pat yourself dry with a CLEAN TOWEL.  10. Wear CLEAN PAJAMAS to bed the night before surgery, wear comfortable clothes the morning of surgery  11. Place CLEAN SHEETS on your bed the night of your first shower and DO NOT SLEEP WITH PETS.  Day of Surgery: Do not apply any deodorants/lotions. Please wear clean clothes to the hospital/surgery center.    Please read over the following fact sheets that you were given. Pain Booklet, Coughing and Deep Breathing, MRSA Information and Surgical Site Infection Prevention

## 2017-09-15 ENCOUNTER — Encounter (HOSPITAL_COMMUNITY): Payer: Self-pay

## 2017-09-15 ENCOUNTER — Encounter (HOSPITAL_COMMUNITY)
Admission: RE | Admit: 2017-09-15 | Discharge: 2017-09-15 | Disposition: A | Discharge status: Home / Self Care | Payer: 59 | Source: Ambulatory Visit | Attending: Neurological Surgery | Admitting: Neurological Surgery

## 2017-09-15 DIAGNOSIS — Z01812 Encounter for preprocedural laboratory examination: Secondary | ICD-10-CM | POA: Diagnosis present

## 2017-09-15 DIAGNOSIS — Z0183 Encounter for blood typing: Secondary | ICD-10-CM | POA: Insufficient documentation

## 2017-09-15 DIAGNOSIS — M4316 Spondylolisthesis, lumbar region: Secondary | ICD-10-CM | POA: Insufficient documentation

## 2017-09-15 HISTORY — DX: Pneumonia, unspecified organism: J18.9

## 2017-09-15 HISTORY — DX: Anemia, unspecified: D64.9

## 2017-09-15 LAB — CBC WITH DIFFERENTIAL/PLATELET
BASOS ABS: 0 10*3/uL (ref 0.0–0.1)
BASOS PCT: 0 %
Eosinophils Absolute: 0.1 10*3/uL (ref 0.0–0.7)
Eosinophils Relative: 1 %
HEMATOCRIT: 44.7 % (ref 36.0–46.0)
HEMOGLOBIN: 14.8 g/dL (ref 12.0–15.0)
LYMPHS PCT: 27 %
Lymphs Abs: 2.4 10*3/uL (ref 0.7–4.0)
MCH: 30.4 pg (ref 26.0–34.0)
MCHC: 33.1 g/dL (ref 30.0–36.0)
MCV: 91.8 fL (ref 78.0–100.0)
MONOS PCT: 6 %
Monocytes Absolute: 0.5 10*3/uL (ref 0.1–1.0)
NEUTROS ABS: 5.6 10*3/uL (ref 1.7–7.7)
NEUTROS PCT: 66 %
Platelets: 287 10*3/uL (ref 150–400)
RBC: 4.87 MIL/uL (ref 3.87–5.11)
RDW: 12.9 % (ref 11.5–15.5)
WBC: 8.6 10*3/uL (ref 4.0–10.5)

## 2017-09-15 LAB — BASIC METABOLIC PANEL
ANION GAP: 8 (ref 5–15)
BUN: 12 mg/dL (ref 6–20)
CALCIUM: 8.7 mg/dL — AB (ref 8.9–10.3)
CO2: 28 mmol/L (ref 22–32)
Chloride: 100 mmol/L — ABNORMAL LOW (ref 101–111)
Creatinine, Ser: 1.05 mg/dL — ABNORMAL HIGH (ref 0.44–1.00)
GFR calc non Af Amer: >60 mL/min (ref >60)
GLUCOSE: 85 mg/dL (ref 65–99)
POTASSIUM: 3.6 mmol/L (ref 3.5–5.1)
Sodium: 136 mmol/L (ref 135–145)

## 2017-09-15 LAB — PROTIME-INR
INR: 1.13
Prothrombin Time: 14.4 seconds (ref 11.4–15.2)

## 2017-09-15 LAB — TYPE AND SCREEN
ABO/RH(D): O POS
ANTIBODY SCREEN: NEGATIVE

## 2017-09-15 LAB — SURGICAL PCR SCREEN
MRSA, PCR: NEGATIVE
Staphylococcus aureus: NEGATIVE

## 2017-09-15 NOTE — Progress Notes (Signed)
PCP is Dr. Wylie Hail Bliss  LOV 08/2017 She states that, greater than 10 -15 yrs ago, she thinks she had heart cath for cp.  Negative results.  Denies murmur, sob, cp.  Does have migraines. Back in May 2018, she did go to urgent care d/t hurting in neck, jaw, arm.  Conclusion, it was coming from a pinched nerve in her neck.

## 2017-09-16 LAB — ABO/RH: ABO/RH(D): O POS

## 2017-09-21 ENCOUNTER — Inpatient Hospital Stay (HOSPITAL_COMMUNITY): Payer: 59 | Admitting: Certified Registered Nurse Anesthetist

## 2017-09-21 ENCOUNTER — Inpatient Hospital Stay (HOSPITAL_COMMUNITY)
Admission: RE | Admit: 2017-09-21 | Discharge: 2017-09-23 | DRG: 455 | Disposition: A | Discharge status: Home / Self Care | Payer: 59 | Source: Ambulatory Visit | Attending: Neurological Surgery | Admitting: Neurological Surgery

## 2017-09-21 ENCOUNTER — Inpatient Hospital Stay (HOSPITAL_COMMUNITY): Payer: 59 | Admitting: Emergency Medicine

## 2017-09-21 ENCOUNTER — Inpatient Hospital Stay (HOSPITAL_COMMUNITY): Payer: 59

## 2017-09-21 ENCOUNTER — Encounter (HOSPITAL_COMMUNITY)
Admission: RE | Disposition: A | Discharge status: Home / Self Care | Payer: Self-pay | Source: Ambulatory Visit | Attending: Neurological Surgery

## 2017-09-21 ENCOUNTER — Encounter (HOSPITAL_COMMUNITY): Payer: Self-pay

## 2017-09-21 DIAGNOSIS — F988 Other specified behavioral and emotional disorders with onset usually occurring in childhood and adolescence: Secondary | ICD-10-CM | POA: Diagnosis present

## 2017-09-21 DIAGNOSIS — Z6838 Body mass index (BMI) 38.0-38.9, adult: Secondary | ICD-10-CM

## 2017-09-21 DIAGNOSIS — Z79899 Other long term (current) drug therapy: Secondary | ICD-10-CM

## 2017-09-21 DIAGNOSIS — Z419 Encounter for procedure for purposes other than remedying health state, unspecified: Secondary | ICD-10-CM

## 2017-09-21 DIAGNOSIS — M4316 Spondylolisthesis, lumbar region: Principal | ICD-10-CM | POA: Diagnosis present

## 2017-09-21 DIAGNOSIS — J45909 Unspecified asthma, uncomplicated: Secondary | ICD-10-CM | POA: Diagnosis present

## 2017-09-21 DIAGNOSIS — I10 Essential (primary) hypertension: Secondary | ICD-10-CM | POA: Diagnosis present

## 2017-09-21 DIAGNOSIS — Z981 Arthrodesis status: Secondary | ICD-10-CM

## 2017-09-21 DIAGNOSIS — K219 Gastro-esophageal reflux disease without esophagitis: Secondary | ICD-10-CM | POA: Diagnosis present

## 2017-09-21 DIAGNOSIS — F419 Anxiety disorder, unspecified: Secondary | ICD-10-CM | POA: Diagnosis present

## 2017-09-21 DIAGNOSIS — F329 Major depressive disorder, single episode, unspecified: Secondary | ICD-10-CM | POA: Diagnosis present

## 2017-09-21 HISTORY — DX: Spondylolisthesis, lumbar region: M43.16

## 2017-09-21 SURGERY — POSTERIOR LUMBAR FUSION 1 LEVEL
Anesthesia: General | Site: Spine Lumbar

## 2017-09-21 MED ORDER — ONDANSETRON HCL 4 MG/2ML IJ SOLN
INTRAMUSCULAR | Status: AC
  Filled 2017-09-21: qty 2

## 2017-09-21 MED ORDER — CHLORHEXIDINE GLUCONATE CLOTH 2 % EX PADS: 6.0000 | MEDICATED_PAD | Freq: Once | CUTANEOUS | Status: DC

## 2017-09-21 MED ORDER — SUGAMMADEX SODIUM 200 MG/2ML IV SOLN
INTRAVENOUS | Status: AC
  Filled 2017-09-21: qty 2

## 2017-09-21 MED ORDER — DIPHENHYDRAMINE HCL 50 MG/ML IJ SOLN
INTRAMUSCULAR | Status: DC | PRN
  Administered 2017-09-21: 12.5 mg via INTRAVENOUS

## 2017-09-21 MED ORDER — MIDAZOLAM HCL 2 MG/2ML IJ SOLN
INTRAMUSCULAR | Status: AC
  Filled 2017-09-21: qty 2

## 2017-09-21 MED ORDER — BUPROPION HCL ER (XL) 300 MG PO TB24
300.0000 mg | ORAL_TABLET | Freq: Every day | ORAL | Status: DC
  Administered 2017-09-21 – 2017-09-23 (×3): 300 mg via ORAL
  Filled 2017-09-21 (×3): qty 1

## 2017-09-21 MED ORDER — LOSARTAN POTASSIUM 50 MG PO TABS
50.0000 mg | ORAL_TABLET | Freq: Every day | ORAL | Status: DC
  Administered 2017-09-21 – 2017-09-23 (×3): 50 mg via ORAL
  Filled 2017-09-21 (×3): qty 1

## 2017-09-21 MED ORDER — ONDANSETRON 4 MG PO TBDP
4.0000 mg | ORAL_TABLET | Freq: Three times a day (TID) | ORAL | Status: DC | PRN
Start: 2017-09-21 — End: 2017-09-21

## 2017-09-21 MED ORDER — ROCURONIUM BROMIDE 10 MG/ML (PF) SYRINGE
PREFILLED_SYRINGE | INTRAVENOUS | Status: AC
  Filled 2017-09-21: qty 5

## 2017-09-21 MED ORDER — ROCURONIUM BROMIDE 100 MG/10ML IV SOLN
INTRAVENOUS | Status: DC | PRN
  Administered 2017-09-21: 60 mg via INTRAVENOUS

## 2017-09-21 MED ORDER — ACETAMINOPHEN 160 MG/5ML PO SOLN: 325.0000 mg | ORAL | Status: DC | PRN

## 2017-09-21 MED ORDER — LACTATED RINGERS IV SOLN
INTRAVENOUS | Status: DC | PRN
  Administered 2017-09-21 (×2): via INTRAVENOUS

## 2017-09-21 MED ORDER — MENTHOL 3 MG MT LOZG
1.0000 | LOZENGE | OROMUCOSAL | Status: DC | PRN
  Administered 2017-09-21: 3 mg via ORAL
  Filled 2017-09-21: qty 9

## 2017-09-21 MED ORDER — KETOROLAC TROMETHAMINE 30 MG/ML IJ SOLN
INTRAMUSCULAR | Status: AC
  Filled 2017-09-21: qty 1

## 2017-09-21 MED ORDER — ACETAMINOPHEN 650 MG RE SUPP: 650.0000 mg | RECTAL | Status: DC | PRN

## 2017-09-21 MED ORDER — CEFAZOLIN SODIUM-DEXTROSE 2-4 GM/100ML-% IV SOLN
2.0000 g | Freq: Three times a day (TID) | INTRAVENOUS | Status: AC
  Administered 2017-09-21 (×2): 2 g via INTRAVENOUS
  Filled 2017-09-21 (×2): qty 100

## 2017-09-21 MED ORDER — FENTANYL CITRATE (PF) 100 MCG/2ML IJ SOLN: 12.5000 ug | INTRAMUSCULAR | Status: DC | PRN

## 2017-09-21 MED ORDER — PROPOFOL 10 MG/ML IV BOLUS
INTRAVENOUS | Status: DC | PRN
  Administered 2017-09-21: 110 mg via INTRAVENOUS
  Administered 2017-09-21: 20 mg via INTRAVENOUS

## 2017-09-21 MED ORDER — OXYCODONE HCL 5 MG PO TABS: 5.0000 mg | ORAL_TABLET | Freq: Once | ORAL | Status: DC | PRN

## 2017-09-21 MED ORDER — ARTIFICIAL TEARS OPHTHALMIC OINT
TOPICAL_OINTMENT | OPHTHALMIC | Status: AC
  Filled 2017-09-21: qty 3.5

## 2017-09-21 MED ORDER — ONDANSETRON HCL 4 MG/2ML IJ SOLN
4.0000 mg | Freq: Four times a day (QID) | INTRAMUSCULAR | Status: DC | PRN
  Administered 2017-09-21: 4 mg via INTRAVENOUS
  Filled 2017-09-21: qty 2

## 2017-09-21 MED ORDER — METHOCARBAMOL 1000 MG/10ML IJ SOLN
500.0000 mg | Freq: Four times a day (QID) | INTRAMUSCULAR | Status: DC | PRN
  Filled 2017-09-21: qty 5

## 2017-09-21 MED ORDER — FENTANYL CITRATE (PF) 100 MCG/2ML IJ SOLN
INTRAMUSCULAR | Status: DC | PRN
  Administered 2017-09-21: 100 ug via INTRAVENOUS
  Administered 2017-09-21: 50 ug via INTRAVENOUS

## 2017-09-21 MED ORDER — OXYCODONE HCL 5 MG/5ML PO SOLN: 5.0000 mg | Freq: Once | ORAL | Status: DC | PRN

## 2017-09-21 MED ORDER — POTASSIUM CHLORIDE IN NACL 20-0.9 MEQ/L-% IV SOLN: INTRAVENOUS | Status: DC

## 2017-09-21 MED ORDER — DIPHENHYDRAMINE HCL 50 MG/ML IJ SOLN
INTRAMUSCULAR | Status: AC
  Filled 2017-09-21: qty 1

## 2017-09-21 MED ORDER — SCOPOLAMINE 1 MG/3DAYS TD PT72
MEDICATED_PATCH | TRANSDERMAL | Status: DC | PRN
  Administered 2017-09-21: 1 via TRANSDERMAL

## 2017-09-21 MED ORDER — THROMBIN (RECOMBINANT) 20000 UNITS EX SOLR
CUTANEOUS | Status: AC
  Filled 2017-09-21: qty 20000

## 2017-09-21 MED ORDER — FENTANYL CITRATE (PF) 100 MCG/2ML IJ SOLN
25.0000 ug | INTRAMUSCULAR | Status: DC | PRN
  Administered 2017-09-21 (×3): 50 ug via INTRAVENOUS

## 2017-09-21 MED ORDER — SENNA 8.6 MG PO TABS
1.0000 | ORAL_TABLET | Freq: Two times a day (BID) | ORAL | Status: DC
  Administered 2017-09-21 – 2017-09-23 (×4): 8.6 mg via ORAL
  Filled 2017-09-21 (×4): qty 1

## 2017-09-21 MED ORDER — METHOCARBAMOL 1000 MG/10ML IJ SOLN
500.0000 mg | INTRAVENOUS | Status: AC
  Administered 2017-09-21: 500 mg via INTRAVENOUS
  Filled 2017-09-21: qty 5

## 2017-09-21 MED ORDER — FENTANYL CITRATE (PF) 100 MCG/2ML IJ SOLN
INTRAMUSCULAR | Status: AC
  Filled 2017-09-21: qty 2

## 2017-09-21 MED ORDER — SUGAMMADEX SODIUM 200 MG/2ML IV SOLN
INTRAVENOUS | Status: DC | PRN
  Administered 2017-09-21: 175 mg via INTRAVENOUS

## 2017-09-21 MED ORDER — CEFAZOLIN SODIUM-DEXTROSE 2-4 GM/100ML-% IV SOLN
2.0000 g | INTRAVENOUS | Status: AC
  Administered 2017-09-21: 2 g via INTRAVENOUS
  Filled 2017-09-21: qty 100

## 2017-09-21 MED ORDER — CELECOXIB 200 MG PO CAPS
200.0000 mg | ORAL_CAPSULE | Freq: Two times a day (BID) | ORAL | Status: DC
  Administered 2017-09-21 – 2017-09-23 (×5): 200 mg via ORAL
  Filled 2017-09-21 (×5): qty 1

## 2017-09-21 MED ORDER — OXYCODONE HCL 5 MG PO TABS: 5.0000 mg | ORAL_TABLET | ORAL | Status: DC | PRN

## 2017-09-21 MED ORDER — LOSARTAN POTASSIUM-HCTZ 50-12.5 MG PO TABS: 1.0000 | ORAL_TABLET | Freq: Every day | ORAL | Status: DC

## 2017-09-21 MED ORDER — METHYLPHENIDATE HCL 5 MG PO TABS: 10.0000 mg | ORAL_TABLET | Freq: Three times a day (TID) | ORAL | Status: DC | PRN

## 2017-09-21 MED ORDER — HYDROCHLOROTHIAZIDE 12.5 MG PO CAPS
12.5000 mg | ORAL_CAPSULE | Freq: Every day | ORAL | Status: DC
  Administered 2017-09-21 – 2017-09-23 (×3): 12.5 mg via ORAL
  Filled 2017-09-21 (×3): qty 1

## 2017-09-21 MED ORDER — MIDAZOLAM HCL 5 MG/5ML IJ SOLN
INTRAMUSCULAR | Status: DC | PRN
  Administered 2017-09-21: 2 mg via INTRAVENOUS

## 2017-09-21 MED ORDER — BUPIVACAINE HCL (PF) 0.25 % IJ SOLN
INTRAMUSCULAR | Status: AC
  Filled 2017-09-21: qty 30

## 2017-09-21 MED ORDER — METHOCARBAMOL 500 MG PO TABS
500.0000 mg | ORAL_TABLET | Freq: Four times a day (QID) | ORAL | Status: DC | PRN
  Administered 2017-09-21 – 2017-09-23 (×8): 500 mg via ORAL
  Filled 2017-09-21 (×10): qty 1

## 2017-09-21 MED ORDER — ACETAMINOPHEN 325 MG PO TABS
650.0000 mg | ORAL_TABLET | ORAL | Status: DC | PRN
  Administered 2017-09-22 – 2017-09-23 (×2): 650 mg via ORAL
  Filled 2017-09-21 (×2): qty 2

## 2017-09-21 MED ORDER — VANCOMYCIN HCL 1000 MG IV SOLR
INTRAVENOUS | Status: AC
  Filled 2017-09-21: qty 1000

## 2017-09-21 MED ORDER — SODIUM CHLORIDE 0.9 % IV SOLN: 250.0000 mL | INTRAVENOUS | Status: DC

## 2017-09-21 MED ORDER — BUPIVACAINE HCL (PF) 0.25 % IJ SOLN
INTRAMUSCULAR | Status: DC | PRN
  Administered 2017-09-21: 3 mL

## 2017-09-21 MED ORDER — ONDANSETRON HCL 4 MG/2ML IJ SOLN
INTRAMUSCULAR | Status: DC | PRN
  Administered 2017-09-21 (×2): 4 mg via INTRAVENOUS

## 2017-09-21 MED ORDER — ONDANSETRON HCL 4 MG PO TABS
4.0000 mg | ORAL_TABLET | Freq: Four times a day (QID) | ORAL | Status: DC | PRN
  Administered 2017-09-21: 4 mg via ORAL
  Filled 2017-09-21: qty 1

## 2017-09-21 MED ORDER — ACETAMINOPHEN 10 MG/ML IV SOLN
INTRAVENOUS | Status: DC | PRN
  Administered 2017-09-21: 1000 mg via INTRAVENOUS

## 2017-09-21 MED ORDER — DEXAMETHASONE SODIUM PHOSPHATE 10 MG/ML IJ SOLN
10.0000 mg | INTRAMUSCULAR | Status: AC
  Administered 2017-09-21: 10 mg via INTRAVENOUS
  Filled 2017-09-21: qty 1

## 2017-09-21 MED ORDER — THROMBIN (RECOMBINANT) 20000 UNITS EX SOLR
CUTANEOUS | Status: DC | PRN
  Administered 2017-09-21: 20 mL via TOPICAL

## 2017-09-21 MED ORDER — ACETAMINOPHEN 325 MG PO TABS: 325.0000 mg | ORAL_TABLET | ORAL | Status: DC | PRN

## 2017-09-21 MED ORDER — PHENOL 1.4 % MT LIQD: 1.0000 | OROMUCOSAL | Status: DC | PRN

## 2017-09-21 MED ORDER — SODIUM CHLORIDE 0.9% FLUSH
3.0000 mL | Freq: Two times a day (BID) | INTRAVENOUS | Status: DC
  Administered 2017-09-21 – 2017-09-22 (×2): 3 mL via INTRAVENOUS

## 2017-09-21 MED ORDER — THROMBIN (RECOMBINANT) 5000 UNITS EX SOLR
CUTANEOUS | Status: AC
  Filled 2017-09-21: qty 5000

## 2017-09-21 MED ORDER — THROMBIN (RECOMBINANT) 5000 UNITS EX SOLR
OROMUCOSAL | Status: DC | PRN
  Administered 2017-09-21: 5 mL via TOPICAL

## 2017-09-21 MED ORDER — VANCOMYCIN HCL 1000 MG IV SOLR
INTRAVENOUS | Status: DC | PRN
  Administered 2017-09-21: 1000 mg

## 2017-09-21 MED ORDER — SODIUM CHLORIDE 0.9 % IR SOLN
Status: DC | PRN
  Administered 2017-09-21: 500 mL

## 2017-09-21 MED ORDER — FENTANYL CITRATE (PF) 250 MCG/5ML IJ SOLN
INTRAMUSCULAR | Status: AC
  Filled 2017-09-21: qty 5

## 2017-09-21 MED ORDER — SODIUM CHLORIDE 0.9% FLUSH: 3.0000 mL | INTRAVENOUS | Status: DC | PRN

## 2017-09-21 MED ORDER — LIDOCAINE HCL (CARDIAC) 20 MG/ML IV SOLN
INTRAVENOUS | Status: DC | PRN
  Administered 2017-09-21: 60 mg via INTRAVENOUS

## 2017-09-21 MED ORDER — PROPOFOL 10 MG/ML IV BOLUS
INTRAVENOUS | Status: AC
  Filled 2017-09-21: qty 40

## 2017-09-21 MED ORDER — OXYCODONE HCL 5 MG PO TABS
5.0000 mg | ORAL_TABLET | ORAL | Status: DC | PRN
  Administered 2017-09-21 – 2017-09-23 (×12): 5 mg via ORAL
  Filled 2017-09-21 (×13): qty 1

## 2017-09-21 MED ORDER — KETOROLAC TROMETHAMINE 30 MG/ML IJ SOLN
30.0000 mg | Freq: Once | INTRAMUSCULAR | Status: AC
  Administered 2017-09-21: 30 mg via INTRAVENOUS

## 2017-09-21 MED ORDER — 0.9 % SODIUM CHLORIDE (POUR BTL) OPTIME
TOPICAL | Status: DC | PRN
  Administered 2017-09-21: 1000 mL

## 2017-09-21 MED ORDER — ARTIFICIAL TEARS OPHTHALMIC OINT
TOPICAL_OINTMENT | OPHTHALMIC | Status: DC | PRN
  Administered 2017-09-21: 1 via OPHTHALMIC

## 2017-09-21 MED ORDER — ALBUTEROL SULFATE (2.5 MG/3ML) 0.083% IN NEBU: 2.5000 mg | INHALATION_SOLUTION | RESPIRATORY_TRACT | Status: DC | PRN

## 2017-09-21 MED ORDER — LIDOCAINE 2% (20 MG/ML) 5 ML SYRINGE
INTRAMUSCULAR | Status: AC
  Filled 2017-09-21: qty 5

## 2017-09-21 SURGICAL SUPPLY — 63 items
ATEC PORO TI PS 10D 9W 25X8X10 (Bone Implant) ×4 IMPLANT
BAG DECANTER FOR FLEXI CONT (MISCELLANEOUS) ×2 IMPLANT
BASKET BONE COLLECTION (BASKET) ×2 IMPLANT
BENZOIN TINCTURE PRP APPL 2/3 (GAUZE/BANDAGES/DRESSINGS) ×2 IMPLANT
BLADE CLIPPER SURG (BLADE) IMPLANT
BUR MATCHSTICK NEURO 3.0 LAGG (BURR) ×2 IMPLANT
CANISTER SUCT 3000ML PPV (MISCELLANEOUS) ×2 IMPLANT
CARTRIDGE OIL MAESTRO DRILL (MISCELLANEOUS) ×1 IMPLANT
CLSR STERI-STRIP ANTIMIC 1/2X4 (GAUZE/BANDAGES/DRESSINGS) ×2 IMPLANT
CONT SPEC 4OZ CLIKSEAL STRL BL (MISCELLANEOUS) ×2 IMPLANT
COVER BACK TABLE 60X90IN (DRAPES) ×2 IMPLANT
DERMABOND ADVANCED (GAUZE/BANDAGES/DRESSINGS) ×1
DERMABOND ADVANCED .7 DNX12 (GAUZE/BANDAGES/DRESSINGS) ×1 IMPLANT
DIFFUSER DRILL AIR PNEUMATIC (MISCELLANEOUS) ×2 IMPLANT
DRAPE C-ARM 42X72 X-RAY (DRAPES) ×2 IMPLANT
DRAPE C-ARMOR (DRAPES) ×2 IMPLANT
DRAPE LAPAROTOMY 100X72X124 (DRAPES) ×2 IMPLANT
DRAPE POUCH INSTRU U-SHP 10X18 (DRAPES) ×2 IMPLANT
DRAPE SURG 17X23 STRL (DRAPES) ×2 IMPLANT
DRSG OPSITE POSTOP 4X6 (GAUZE/BANDAGES/DRESSINGS) ×2 IMPLANT
DURAPREP 26ML APPLICATOR (WOUND CARE) ×2 IMPLANT
ELECT BLADE 4.0 EZ CLEAN MEGAD (MISCELLANEOUS) ×2
ELECT REM PT RETURN 9FT ADLT (ELECTROSURGICAL) ×2
ELECTRODE BLDE 4.0 EZ CLN MEGD (MISCELLANEOUS) ×1 IMPLANT
ELECTRODE REM PT RTRN 9FT ADLT (ELECTROSURGICAL) ×1 IMPLANT
EVACUATOR 1/8 PVC DRAIN (DRAIN) IMPLANT
GAUZE SPONGE 4X4 16PLY XRAY LF (GAUZE/BANDAGES/DRESSINGS) IMPLANT
GLOVE BIO SURGEON STRL SZ7 (GLOVE) ×4 IMPLANT
GLOVE BIO SURGEON STRL SZ8 (GLOVE) ×4 IMPLANT
GLOVE BIOGEL PI IND STRL 7.0 (GLOVE) ×3 IMPLANT
GLOVE BIOGEL PI IND STRL 7.5 (GLOVE) ×2 IMPLANT
GLOVE BIOGEL PI INDICATOR 7.0 (GLOVE) ×3
GLOVE BIOGEL PI INDICATOR 7.5 (GLOVE) ×2
GLOVE SURG SS PI 7.5 STRL IVOR (GLOVE) ×4 IMPLANT
GOWN STRL REUS W/ TWL LRG LVL3 (GOWN DISPOSABLE) ×3 IMPLANT
GOWN STRL REUS W/ TWL XL LVL3 (GOWN DISPOSABLE) ×3 IMPLANT
GOWN STRL REUS W/TWL 2XL LVL3 (GOWN DISPOSABLE) IMPLANT
GOWN STRL REUS W/TWL LRG LVL3 (GOWN DISPOSABLE) ×3
GOWN STRL REUS W/TWL XL LVL3 (GOWN DISPOSABLE) ×3
HEMOSTAT POWDER KIT SURGIFOAM (HEMOSTASIS) ×2 IMPLANT
KIT BASIN OR (CUSTOM PROCEDURE TRAY) ×2 IMPLANT
KIT ROOM TURNOVER OR (KITS) ×2 IMPLANT
NEEDLE HYPO 25X1 1.5 SAFETY (NEEDLE) ×2 IMPLANT
NS IRRIG 1000ML POUR BTL (IV SOLUTION) ×2 IMPLANT
OIL CARTRIDGE MAESTRO DRILL (MISCELLANEOUS) ×2
PACK LAMINECTOMY NEURO (CUSTOM PROCEDURE TRAY) ×2 IMPLANT
PAD ARMBOARD 7.5X6 YLW CONV (MISCELLANEOUS) ×6 IMPLANT
PUTTY DBM ALLOSYNC PURE 10CC (Putty) ×2 IMPLANT
ROD PC 5.5X40 TI ARSENAL (Rod) ×4 IMPLANT
SCREW CANC CBX 5.0X35 (Screw) ×8 IMPLANT
SCREW SET SPINAL ARSENAL 47127 (Screw) ×8 IMPLANT
SPACER PORUS ATEC 10D9W25X8X10 (Bone Implant) ×2 IMPLANT
SPONGE LAP 4X18 X RAY DECT (DISPOSABLE) IMPLANT
SPONGE SURGIFOAM ABS GEL 100 (HEMOSTASIS) ×2 IMPLANT
STRIP CLOSURE SKIN 1/2X4 (GAUZE/BANDAGES/DRESSINGS) ×4 IMPLANT
SUT VIC AB 0 CT1 18XCR BRD8 (SUTURE) ×1 IMPLANT
SUT VIC AB 0 CT1 8-18 (SUTURE) ×1
SUT VIC AB 2-0 CP2 18 (SUTURE) ×4 IMPLANT
SUT VIC AB 3-0 SH 8-18 (SUTURE) ×4 IMPLANT
TOWEL GREEN STERILE (TOWEL DISPOSABLE) ×2 IMPLANT
TOWEL GREEN STERILE FF (TOWEL DISPOSABLE) ×2 IMPLANT
TRAY FOLEY W/METER SILVER 16FR (SET/KITS/TRAYS/PACK) ×2 IMPLANT
WATER STERILE IRR 1000ML POUR (IV SOLUTION) ×2 IMPLANT

## 2017-09-21 NOTE — Anesthesia Preprocedure Evaluation (Signed)
Anesthesia Evaluation  Patient identified by MRN, date of birth, ID band Patient awake    Reviewed: Allergy & Precautions, NPO status , Patient's Chart, lab work & pertinent test results  History of Anesthesia Complications (+) PONV and history of anesthetic complications  Airway Mallampati: II  TM Distance: >3 FB Neck ROM: Full    Dental  (+) Teeth Intact   Pulmonary neg shortness of breath, asthma , neg sleep apnea, neg COPD, neg recent URI,    breath sounds clear to auscultation       Cardiovascular hypertension, Pt. on medications  Rhythm:Regular     Neuro/Psych  Headaches, PSYCHIATRIC DISORDERS Anxiety Depression    GI/Hepatic Neg liver ROS, GERD  ,  Endo/Other  Morbid obesity  Renal/GU negative Renal ROS     Musculoskeletal  (+) Arthritis ,   Abdominal   Peds  Hematology negative hematology ROS (+)   Anesthesia Other Findings   Reproductive/Obstetrics                             Anesthesia Physical Anesthesia Plan  ASA: II  Anesthesia Plan: General   Post-op Pain Management:    Induction: Intravenous  PONV Risk Score and Plan: 4 or greater and Ondansetron, Dexamethasone, Midazolam, Scopolamine patch - Pre-op and Propofol infusion  Airway Management Planned: Oral ETT  Additional Equipment: None  Intra-op Plan:   Post-operative Plan: Extubation in OR  Informed Consent: I have reviewed the patients History and Physical, chart, labs and discussed the procedure including the risks, benefits and alternatives for the proposed anesthesia with the patient or authorized representative who has indicated his/her understanding and acceptance.   Dental advisory given  Plan Discussed with: CRNA and Surgeon  Anesthesia Plan Comments:         Anesthesia Quick Evaluation

## 2017-09-21 NOTE — Transfer of Care (Signed)
Immediate Anesthesia Transfer of Care Note  Patient: Christina Potts  Procedure(s) Performed: Posterior Lumbar Interbody Fusion - Lumbar four-Lumbar five (N/A Spine Lumbar)  Patient Location: PACU  Anesthesia Type:General  Level of Consciousness: awake, alert  and oriented  Airway & Oxygen Therapy: Patient Spontanous Breathing and Patient connected to nasal cannula oxygen  Post-op Assessment: Report given to RN and Post -op Vital signs reviewed and stable  Post vital signs: Reviewed and stable  Last Vitals:  Vitals:   09/21/17 0552  BP: (!) 145/84  Pulse: 76  Resp: 18  Temp: 36.9 C  SpO2: 98%    Last Pain:  Vitals:   09/21/17 0558  TempSrc:   PainSc: 5       Patients Stated Pain Goal: 3 (09/21/17 0558)  Complications: No apparent anesthesia complications

## 2017-09-21 NOTE — Op Note (Signed)
09/21/2017  10:47 AM  PATIENT:  Christina Potts  46 y.o. female  PRE-OPERATIVE DIAGNOSIS:  Spondylolisthesis L4-5 with back and leg pain  POST-OPERATIVE DIAGNOSIS:  same  PROCEDURE:   1. Decompressive lumbar laminectomy L4-5 requiring more work than would be required for a simple exposure of the disk for PLIF in order to adequately decompress the neural elements and address the spinal stenosis 2. Posterior lumbar interbody fusion L4-5 using porous titanium interbody cages packed with morcellized allograft and autograft 3. Posterior fixation L4-5 using Alphatec cortical pedicle screws.  4. Intertransverse arthrodesis L4-5 using morcellized autograft and allograft.  SURGEON:  Marikay Alar, MD  ASSISTANTS: Dr. Newell Coral  ANESTHESIA:  General  EBL: 150 ml  Total I/O In: 1000 [I.V.:1000] Out: 325 [Urine:175; Blood:150]  BLOOD ADMINISTERED:none  DRAINS: none   INDICATION FOR PROCEDURE: This patient presented with back and leg pain. Imaging revealed anterolisthesis L4-5 over 5. The patient tried a reasonable attempt at conservative medical measures without relief. I recommended decompression and instrumented fusion to address the stenosis as well as the segmental  instability.  Patient understood the risks, benefits, and alternatives and potential outcomes and wished to proceed.  PROCEDURE DETAILS:  The patient was brought to the operating room. After induction of generalized endotracheal anesthesia the patient was rolled into the prone position on chest rolls and all pressure points were padded. The patient's lumbar region was cleaned and then prepped with DuraPrep and draped in the usual sterile fashion. Anesthesia was injected and then a dorsal midline incision was made and carried down to the lumbosacral fascia. The fascia was opened and the paraspinous musculature was taken down in a subperiosteal fashion to expose L45. A self-retaining retractor was placed. Intraoperative fluoroscopy  confirmed my level, and I started with placement of the L4 cortical pedicle screws. The pedicle screw entry zones were identified utilizing surface landmarks and  AP and lateral fluoroscopy. I scored the cortex with the high-speed drill and then used the hand drill to drill an upward and outward direction into the pedicle. I then tapped line to line. I then placed a 5.0 x 35 mm cortical pedicle screw into the pedicles of L4 bilaterally. I then turned my attention to the decompression and complete lumbar laminectomies, hemi- facetectomies, and foraminotomies were performed at L4-5. The patient had significant spinal stenosis and this required more work than would be required for a simple exposure of the disc for posterior lumbar interbody fusion which would only require a limited laminotomy. Much more generous decompression and generous foraminotomy was undertaken in order to adequately decompress the neural elements and address the patient's leg pain. The yellow ligament was removed to expose the underlying dura and nerve roots, and generous foraminotomies were performed to adequately decompress the neural elements. Both the exiting and traversing nerve roots were decompressed on both sides until a coronary dilator passed easily along the nerve roots. Once the decompression was complete, I turned my attention to the posterior lower lumbar interbody fusion. The epidural venous vasculature was coagulated and cut sharply. Disc space was incised and the initial discectomy was performed with pituitary rongeurs. The disc space was distracted with sequential distractors to a height of 10 mm. We then used a series of scrapers and shavers to prepare the endplates for fusion. The midline was prepared with Epstein curettes. Once the complete discectomy was finished, we packed an appropriate sized porous titanium interbody cage with local autograft and morcellized allograft, gently retracted the nerve root, and tapped  the cage  into position at L4-5.  The midline between the cages was packed with morselized autograft and allograft. We then turned our attention to the placement of the lower pedicle screws. The pedicle screw entry zones were identified utilizing surface landmarks and fluoroscopy. I drilled into each pedicle utilizing the hand drill, and tapped each pedicle with the appropriate tap. We palpated with a ball probe to assure no break in the cortex. We then placed 6.5 x 35 mm cortical pedicle screws into the pedicles bilaterally at L5. We then decorticated the transverse processes and laid a mixture of morcellized autograft and allograft out over these to perform intertransverse arthrodesis at L4-5. We then placed lordotic rods into the multiaxial screw heads of the pedicle screws and locked these in position with the locking caps and anti-torque device. We then checked our construct with AP and lateral fluoroscopy. Irrigated with copious amounts of bacitracin-containing saline solution. Inspected the nerve roots once again to assure adequate decompression, lined to the dura with Gelfoam, placed powdered vancomycin into the wound, and closed the muscle and the fascia with 0 Vicryl. Closed the subcutaneous tissues with 2-0 Vicryl and subcuticular tissues with 3-0 Vicryl. The skin was closed with benzoin and Steri-Strips. Dressing was then applied, the patient was awakened from general anesthesia and transported to the recovery room in stable condition. At the end of the procedure all sponge, needle and instrument counts were correct.   PLAN OF CARE: admit to inpatient  PATIENT DISPOSITION:  PACU - hemodynamically stable.   Delay start of Pharmacological VTE agent (>24hrs) due to surgical blood loss or risk of bleeding:  yes

## 2017-09-21 NOTE — Progress Notes (Signed)
Orthopedic Tech Progress Note Patient Details:  Domenick GongGarlena G Quesinberry 1971-03-05 [id number] Patient has brace. Patient ID: Domenick GongGarlena G [place], female   DOB: 1971-03-05, 46 y.o.   MRN: [id number]   Jennye MoccasinHughes, Piccola Arico Craig 09/21/2017, 2:13 PM

## 2017-09-21 NOTE — H&P (Signed)
Subjective: Patient is a 46 y.o. female admitted for plif. Onset of symptoms was several years ago, gradually worsening since that time.  The pain is rated intense, and is located at the across the lower back and radiates to legs. The pain is described as aching and occurs all day. The symptoms have been progressive. Symptoms are exacerbated by exercise. MRI or CT showed spondylolisthesis L4-5   Past Medical History:  Diagnosis Date  . ADD (attention deficit disorder)   . Anemia    as child  . Anxiety   . Arthritis   . Asthma   . Asthma   . DDD (degenerative disc disease), cervical   . Depression   . Diverticulosis   . GERD (gastroesophageal reflux disease)   . History of snoring   . Hypertension   . IBS (irritable bowel syndrome)   . Migraines    had one 2 weeks ago  . Pneumonia    2014  . PONV (postoperative nausea and vomiting)   . Spondylolisthesis of lumbar region     Past Surgical History:  Procedure Laterality Date  . ABDOMINAL HYSTERECTOMY    . CARPAL TUNNEL RELEASE Left   . CESAREAN SECTION    . CESAREAN SECTION     X 2  . COLONOSCOPY WITH ESOPHAGOGASTRODUODENOSCOPY (EGD)    . HAND SURGERY Left   . PARTIAL HYSTERECTOMY    . WRIST SURGERY Left    Cartlige bremoved    Prior to Admission medications   Medication Sig Start Date End Date Taking? Authorizing Provider  albuterol (PROVENTIL HFA;VENTOLIN HFA) 108 (90 Base) MCG/ACT inhaler Inhale 2 puffs into the lungs every 4 (four) hours as needed for wheezing or shortness of breath.   Yes [provider]  buPROPion (WELLBUTRIN XL) 300 MG 24 hr tablet Take 300 mg by mouth daily.   Yes [provider]  diazepam (VALIUM) 5 MG tablet Take 5 mg by mouth 3 (three) times daily as needed for muscle spasms.  11/14/15  Yes [provider]  losartan-hydrochlorothiazide (HYZAAR) 50-12.5 MG per tablet Take 1 tablet by mouth daily.   Yes [provider]  meloxicam (MOBIC) 15 MG tablet Take 15 mg by  mouth daily as needed for pain.   Yes [provider]  methylphenidate (RITALIN) 10 MG tablet Take 10 mg by mouth 3 (three) times daily as needed (for attention).    Yes [provider]  ondansetron (ZOFRAN-ODT) 4 MG disintegrating tablet Take 4 mg by mouth every 8 (eight) hours as needed for nausea or vomiting.    Yes [provider]  SUMAtriptan (IMITREX) 100 MG tablet Take 100 mg by mouth every 2 (two) hours as needed for migraine or headache.  07/21/17  Yes [provider]   Allergies  Allergen Reactions  . Dilaudid [Hydromorphone Hcl] Anaphylaxis and Shortness Of Breath  . Strawberry (Diagnostic) Anaphylaxis, Hives and Swelling  . Morphine And Related Rash and Other (See Comments)    "Burns me from the inside out"    Social History  Substance Use Topics  . Smoking status: Never Smoker  . Smokeless tobacco: Never Used  . Alcohol use Yes     Comment: rare    History reviewed. No pertinent family history.   Review of Systems  Positive ROS: neg  All other systems have been reviewed and were otherwise negative with the exception of those mentioned in the HPI and as above.  Objective: Vital signs in last 24 hours: Temp:  [98.4  F (36.9 C)] 98.4 F (36.9 C) (10/24 0552) Pulse Rate:  [76] 76 (10/24 0552) Resp:  [18] 18 (10/24 0552) BP: (145)/(84) 145/84 (10/24 0552) SpO2:  [98 %] 98 % (10/24 0552) Weight:  [87.1 kg (192 lb)] 87.1 kg (192 lb) (10/24 0558)  General Appearance: Alert, cooperative, no distress, appears stated age Head: Normocephalic, without obvious abnormality, atraumatic Eyes: PERRL, conjunctiva/corneas clear, EOM's intact    Neck: Supple, symmetrical, trachea midline Back: Symmetric, no curvature, ROM normal, no CVA tenderness Lungs:  respirations unlabored Heart: Regular rate and rhythm Abdomen: Soft, non-tender Extremities: Extremities normal, atraumatic, no cyanosis or edema Pulses: 2+ and symmetric all  extremities Skin: Skin color, texture, turgor normal, no rashes or lesions  NEUROLOGIC:   Mental status: Alert and oriented x4,  no aphasia, good attention span, fund of knowledge, and memory Motor Exam - grossly normal Sensory Exam - grossly normal Reflexes: trace Coordination - grossly normal Gait - grossly normal Balance - grossly normal Cranial Nerves: I: smell Not tested  II: visual acuity  OS: nl    OD: nl  II: visual fields Full to confrontation  II: pupils Equal, round, reactive to light  III,VII: ptosis None  III,IV,VI: extraocular muscles  Full ROM  V: mastication Normal  V: facial light touch sensation  Normal  V,VII: corneal reflex  Present  VII: facial muscle function - upper  Normal  VII: facial muscle function - lower Normal  VIII: hearing Not tested  IX: soft palate elevation  Normal  IX,X: gag reflex Present  XI: trapezius strength  5/5  XI: sternocleidomastoid strength 5/5  XI: neck flexion strength  5/5  XII: tongue strength  Normal    Data Review Lab Results  Component Value Date   WBC 8.6 09/15/2017   HGB 14.8 09/15/2017   HCT 44.7 09/15/2017   MCV 91.8 09/15/2017   PLT 287 09/15/2017   Lab Results  Component Value Date   NA 136 09/15/2017   K 3.6 09/15/2017   CL 100 (L) 09/15/2017   CO2 28 09/15/2017   BUN 12 09/15/2017   CREATININE 1.05 (H) 09/15/2017   GLUCOSE 85 09/15/2017   Lab Results  Component Value Date   INR 1.13 09/15/2017    Assessment/Plan: Patient admitted for PLIF L4-5. Patient has failed a reasonable attempt at conservative therapy.  I explained the condition and procedure to the patient and answered any questions.  Patient wishes to proceed with procedure as planned. Understands risks/ benefits and typical outcomes of procedure.   Linzie Criss S 09/21/2017 7:26 AM

## 2017-09-21 NOTE — Evaluation (Signed)
Physical Therapy Evaluation Patient Details Name: Christina Potts MRN: [id number] DOB: February 16, 1971 Today's Date: 09/21/2017   History of Present Illness  Pt is a 46 y/o female s/p L4-5 PLIF. PMH includes depression, anxiety, HTN, and migraine.   Clinical Impression  Patient is s/p above surgery resulting in the deficits listed below (see PT Problem List). PTA, pt was independent with functional mobility, however, reports she was limited by pain. Upon eval, pt presenting with L ankle pain (from previous fall), R knee pain (from previous fall), back pain, and unsteadiness during gait. Required HHA and use of rail in hallway for balance. Educated about using RW at home to increase stability. Reports parents will be able to assist as needed upon d/c home and will need DME below. Patient will benefit from skilled PT to increase their independence and safety with mobility (while adhering to their precautions) to allow discharge to the venue listed below. Will continue to follow acutely.      Follow Up Recommendations No PT follow up;Supervision for mobility/OOB    Equipment Recommendations  Rolling walker with 5" wheels;3in1 (PT)    Recommendations for Other Services       Precautions / Restrictions Precautions Precautions: Fall;Back Precaution Booklet Issued: Yes (comment) Precaution Comments: History of fall down the steps of her porch. Reviewed back precautions with pt.  Required Braces or Orthoses: Spinal Brace Spinal Brace: Lumbar corset;Applied in standing position Restrictions Weight Bearing Restrictions: No      Mobility  Bed Mobility Overal bed mobility: Needs Assistance Bed Mobility: Sit to Sidelying;Rolling Rolling: Supervision       Sit to sidelying: Supervision General bed mobility comments: Supervision to ensure log roll technique   Transfers Overall transfer level: Needs assistance Equipment used: 1 person hand held assist Transfers: Sit to/from Stand Sit to  Stand: Min guard         General transfer comment: Min guard for safety   Ambulation/Gait Ambulation/Gait assistance: Min assist;Min guard Ambulation Distance (Feet): 150 Feet Assistive device: 1 person hand held assist (use of rails in hall ) Gait Pattern/deviations: Step-through pattern;Decreased stride length Gait velocity: Decreased  Gait velocity interpretation: Below normal speed for age/gender General Gait Details: Slow, unsteady gait. Pt reporting increased ankle pain with ambulation and reporting unsteadiness. Required min to min guard assist with mobility. Educated about use of RW to increase stability.   Stairs            Wheelchair Mobility    Modified Rankin (Stroke Patients Only)       Balance Overall balance assessment: Needs assistance Sitting-balance support: No upper extremity supported;Feet supported Sitting balance-Leahy Scale: Good     Standing balance support: Bilateral upper extremity supported;During functional activity;Single extremity supported Standing balance-Leahy Scale: Poor Standing balance comment: Reliant on UE support for balance                              Pertinent Vitals/Pain Pain Assessment: Faces Faces Pain Scale: Hurts even more Pain Location: back  Pain Descriptors / Indicators: Aching;Operative site guarding Pain Intervention(s): Limited activity within patient's tolerance;Monitored during session;Repositioned    Home Living Family/patient expects to be discharged to:: Private residence Living Arrangements: Parent;Children Available Help at Discharge: Family;Available 24 hours/day Type of Home: House Home Access: Stairs to enter Entrance Stairs-Rails: Lawyer of Steps: 6 Home Layout: One level Home Equipment: None      Prior Function Level of Independence: Independent  Comments: But reports increased pain      Hand Dominance        Extremity/Trunk  Assessment   Upper Extremity Assessment Upper Extremity Assessment: Defer to OT evaluation    Lower Extremity Assessment Lower Extremity Assessment: RLE deficits/detail;LLE deficits/detail RLE Deficits / Details: RLE knee pain at baseline secondary to past fall LLE Deficits / Details: L ankle pain secondary to sprained ankle from fall.     Cervical / Trunk Assessment Cervical / Trunk Assessment: Other exceptions Cervical / Trunk Exceptions: s/p PLIF   Communication   Communication: No difficulties  Cognition Arousal/Alertness: Awake/alert Behavior During Therapy: WFL for tasks assessed/performed Overall Cognitive Status: Within Functional Limits for tasks assessed                                        General Comments General comments (skin integrity, edema, etc.): Educated about generalized walking program to perform at home.     Exercises     Assessment/Plan    PT Assessment Patient needs continued PT services  PT Problem List Decreased strength;Decreased balance;Decreased mobility;Decreased knowledge of use of DME;Decreased knowledge of precautions;Pain       PT Treatment Interventions DME instruction;Gait training;Stair training;Functional mobility training;Therapeutic activities;Therapeutic exercise;Neuromuscular re-education;Balance training;Patient/family education    PT Goals (Current goals can be found in the Care Plan section)  Acute Rehab PT Goals Patient Stated Goal: to go home  PT Goal Formulation: With patient Time For Goal Achievement: 09/28/17 Potential to Achieve Goals: Good    Frequency Min 5X/week   Barriers to discharge        Co-evaluation               AM-PAC PT "6 Clicks" Daily Activity  Outcome Measure Difficulty turning over in bed (including adjusting bedclothes, sheets and blankets)?: None Difficulty moving from lying on back to sitting on the side of the bed? : None Difficulty sitting down on and standing up  from a chair with arms (e.g., wheelchair, bedside commode, etc,.)?: Unable Help needed moving to and from a bed to chair (including a wheelchair)?: A Little Help needed walking in hospital room?: A Little Help needed climbing 3-5 steps with a railing? : A Little 6 Click Score: 18    End of Session Equipment Utilized During Treatment: Gait belt;Back brace Activity Tolerance: Patient tolerated treatment well Patient left: in bed;with call bell/phone within reach Nurse Communication: Mobility status PT Visit Diagnosis: Other abnormalities of gait and mobility (R26.89);Pain Pain - part of body:  (back )    Time: [id number] PT Time Calculation (min) (ACUTE ONLY): 27 min   Charges:   PT Evaluation $PT Eval Low Complexity: 1 Low PT Treatments $Gait Training: 8-22 mins   PT G Codes:        Gladys DammeBrittany Jayon Matton, PT, DPT  Acute Rehabilitation Services  Pager: [phone]   Lehman PromBrittany S Media Pizzini 09/21/2017, 5:10 PM

## 2017-09-21 NOTE — Anesthesia Procedure Notes (Signed)
Procedure Name: Intubation Date/Time: 09/21/2017 7:41 AM Performed by: Candis Shine Pre-anesthesia Checklist: Patient identified, Emergency Drugs available, Suction available and Patient being monitored Patient Re-evaluated:Patient Re-evaluated prior to induction Oxygen Delivery Method: Circle System Utilized Preoxygenation: Pre-oxygenation with 100% oxygen Induction Type: IV induction Ventilation: Mask ventilation without difficulty Laryngoscope Size: Mac and 3 Grade View: Grade II Tube type: Oral Tube size: 7.0 mm Number of attempts: 1 Airway Equipment and Method: Stylet Placement Confirmation: ETT inserted through vocal cords under direct vision,  positive ETCO2 and breath sounds checked- equal and bilateral Secured at: 22 cm Tube secured with: Tape Dental Injury: Teeth and Oropharynx as per pre-operative assessment

## 2017-09-22 MED ORDER — DIPHENHYDRAMINE HCL 25 MG PO CAPS
25.0000 mg | ORAL_CAPSULE | Freq: Four times a day (QID) | ORAL | Status: DC | PRN
  Administered 2017-09-22 – 2017-09-23 (×2): 25 mg via ORAL
  Filled 2017-09-22 (×2): qty 1

## 2017-09-22 NOTE — Progress Notes (Signed)
Physical Therapy Treatment Patient Details Name: Christina Potts MRN: [id number] DOB: 1971/04/07 Today's Date: 09/22/2017    History of Present Illness Pt is a 46 y/o female s/p L4-5 PLIF. PMH includes depression, anxiety, HTN, and migraine.     PT Comments    Pt progressing towards physical therapy goals. Was able to perform transfers and ambulation with gross supervision to min guard assist for safety. Pt requests RW for community ambulation as she reports increased feeling of weakness and unsteadiness with longer distance due to prior ankle injury. Feel this is reasonable to decrease risk of falls. Will continue to follow and progress as able per POC.   Follow Up Recommendations  No PT follow up;Supervision for mobility/OOB     Equipment Recommendations  Rolling walker with 5" wheels (youth size);3in1 (PT)    Recommendations for Other Services       Precautions / Restrictions Precautions Precautions: Fall;Back Precaution Booklet Issued: Yes (comment) Precaution Comments: History of fall down the steps of her porch. Reviewed back precautions with pt.  Required Braces or Orthoses: Spinal Brace Spinal Brace: Lumbar corset;Applied in standing position Restrictions Weight Bearing Restrictions: No    Mobility  Bed Mobility Overal bed mobility: Needs Assistance Bed Mobility: Rolling;Sidelying to Sit Rolling: Modified independent (Device/Increase time) Sidelying to sit: Supervision       General bed mobility comments: Pt demonstrated proper log roll technique with HOB flat and rails lowered to simulate home environment.   Transfers Overall transfer level: Needs assistance Equipment used: None Transfers: Sit to/from Stand Sit to Stand: Supervision         General transfer comment: Supervision for safety. Pt demonstrated proper hand placement on seated surface for safety.  Ambulation/Gait Ambulation/Gait assistance: Supervision Ambulation Distance (Feet): 250  Feet Assistive device: None Gait Pattern/deviations: Step-through pattern;Decreased stride length Gait velocity: Decreased  Gait velocity interpretation: Below normal speed for age/gender General Gait Details: Grossly slow with occasional instability noted. Pt reports feeling weak in her ankle and with some increased pain with ambulation.    Stairs Stairs: Yes   Stair Management: One rail Right;Step to pattern;Forwards Number of Stairs: 6 General stair comments: VC's for sequencing and general safety. Pt was able to complete without assistance.   Wheelchair Mobility    Modified Rankin (Stroke Patients Only)       Balance Overall balance assessment: Needs assistance Sitting-balance support: No upper extremity supported;Feet supported Sitting balance-Leahy Scale: Good     Standing balance support: No upper extremity supported;During functional activity;Single extremity supported Standing balance-Leahy Scale: Fair Standing balance comment: Pt with some furniture walking while in room. No LOB or unsteadiness observed.                             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 and has been on limitations ever since her fall       Pertinent Vitals/Pain Pain Assessment: Faces Faces Pain Scale: Hurts a little bit Pain Location: back  Pain Descriptors / Indicators: Aching;Operative site guarding Pain  Intervention(s): Monitored during session;Repositioned    Home Living Family/patient expects to be discharged to:: Private residence Living Arrangements: Parent;Children Available Help at Discharge: Family;Available 24 hours/day Type of Home:  House Home Access: Stairs to enter Entrance Stairs-Rails: Left;Right Home Layout: One level Home Equipment: None      Prior Function Level of Independence: Independent      Comments: But reports increased pain. Pt works full time    PT Goals (current goals can now be found in the care plan section) Acute Rehab PT Goals Patient Stated Goal: to go home  PT Goal Formulation: With patient Time For Goal Achievement: 09/28/17 Potential to Achieve Goals: Good Progress towards PT goals: Progressing toward goals    Frequency    Min 5X/week      PT Plan Current plan remains appropriate    Co-evaluation              AM-PAC PT "6 Clicks" Daily Activity  Outcome Measure  Difficulty turning over in bed (including adjusting bedclothes, sheets and blankets)?: None Difficulty moving from lying on back to sitting on the side of the bed? : None Difficulty sitting down on and standing up from a chair with arms (e.g., wheelchair, bedside commode, etc,.)?: Unable Help needed moving to and from a bed to chair (including a wheelchair)?: A Little Help needed walking in hospital room?: A Little Help needed climbing 3-5 steps with a railing? : A Little 6 Click Score: 18    End of Session Equipment Utilized During Treatment: Gait belt;Back brace Activity Tolerance: Patient tolerated treatment well Patient left: in bed;with call bell/phone within reach Nurse Communication: Mobility status PT Visit Diagnosis: Other abnormalities of gait and mobility (R26.89);Pain Pain - part of body:  (back )     Time: 0454-0981 PT Time Calculation (min) (ACUTE ONLY): 26 min  Charges:  $Gait Training: 23-37 mins                    G Codes:       Conni Slipper, PT, DPT Acute Rehabilitation Services Pager: [phone]    Marylynn Pearson 09/22/2017, 9:49 AM

## 2017-09-22 NOTE — Therapy (Signed)
Occupational Therapy Evaluation Patient Details Name: Christina Potts MRN: [id number] DOB: [birth year] Today's Date: 09/22/2017    History of Present Illness Pt is a 46 y/o female s/p L4-5 PLIF. PMH includes depression, anxiety, HTN, and migraine.    Clinical Impression   Pt reports being independent in ADLs with some limitations due to pain PTA. Currently, pt requires min assist for LB ADLs and supervision/set up for functional mobility and all other ADL tasks. Pt reports family is available to provide assistance as needed upon d/c home. Pt would benefit from acute OT services to increase independence with LB ADLs. OT will follow acutely to address established goals.     Follow Up Recommendations  No OT follow up;Supervision - Intermittent    Equipment Recommendations  3 in 1 bedside commode    Recommendations for Other Services       Precautions / Restrictions Precautions Precautions: Fall;Back Precaution Booklet Issued: Yes (comment) Precaution Comments: History of fall down the steps of her porch. Reviewed back precautions with pt.  Required Braces or Orthoses: Spinal Brace Spinal Brace: Lumbar corset;Applied in standing position Restrictions Weight Bearing Restrictions: No      Mobility Bed Mobility               General bed mobility comments: Pt sitting in chair upon  arrival  Transfers Overall transfer level: Needs assistance Equipment used: None Transfers: Sit to/from Stand Sit to Stand: Supervision         General transfer comment: Supervision for safety     Balance Overall balance assessment: Needs assistance Sitting-balance support: No upper extremity supported;Feet supported Sitting balance-Leahy Scale: Good     Standing balance support: No upper extremity supported;During functional activity;Single extremity supported Standing balance-Leahy Scale: Fair Standing balance comment: Pt with some furniture walking while in room. No LOB or  unsteadiness observed.                            ADL either performed or assessed with clinical judgement   ADL Overall ADL's : Needs assistance/impaired Eating/Feeding: Independent   Grooming: Supervision/safety;Set up;Standing   Upper Body Bathing: Supervision/ safety;Set up;Sitting   Lower Body Bathing: Minimal assistance;Sit to/from stand   Upper Body Dressing : Supervision/safety;Set up;Sitting   Lower Body Dressing: Minimal assistance;Sit to/from stand   Toilet Transfer: Supervision/safety;Set up;Ambulation Toilet Transfer Details (indicate cue type and reason): Simulated sit to stand from chair         Functional mobility during ADLs: Supervision/safety General ADL Comments: Pt educated on compensatory techniques for ADLs with back precautions. Pt would benefit from AE to increase independence with ADLs.      Vision         Perception     Praxis      Pertinent Vitals/Pain Pain Assessment: Faces Faces Pain Scale: Hurts even more (during mobility ) Pain Location: back  Pain Descriptors / Indicators: Aching;Operative site guarding Pain Intervention(s): Monitored during session     Hand Dominance Right   Extremity/Trunk Assessment Upper Extremity Assessment Upper Extremity Assessment: Overall WFL for tasks assessed   Lower Extremity Assessment Lower Extremity Assessment: Defer to PT evaluation   Cervical / Trunk Assessment Cervical / Trunk Assessment: Other exceptions Cervical / Trunk Exceptions: s/p PLIF    Communication Communication Communication: No difficulties   Cognition Arousal/Alertness: Awake/alert Behavior During Therapy: WFL for tasks assessed/performed Overall Cognitive Status: Within Functional Limits for tasks assessed  General Comments  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 and has been on limitations ever since her fall     Exercises     Shoulder  Instructions      Home Living Family/patient expects to be discharged to:: Private residence Living Arrangements: Parent;Children Available Help at Discharge: Family;Available 24 hours/day Type of Home: House Home Access: Stairs to enter [employer] of Steps: 6 Entrance Stairs-Rails: Left;Right Home Layout: One level     Bathroom Shower/Tub: Chief Strategy Officer: Standard Bathroom Accessibility: Yes How Accessible: Accessible via walker Home Equipment: None          Prior Functioning/Environment Level of Independence: Independent        Comments: But reports increased pain. Pt works full time         OT Problem List: Decreased knowledge of use of DME or AE;Decreased knowledge of precautions      OT Treatment/Interventions: Self-care/ADL training;DME and/or AE instruction;Therapeutic activities;Patient/family education;Balance training    OT Goals(Current goals can be found in the care plan section) Acute Rehab OT Goals Patient Stated Goal: to go home  OT Goal Formulation: With patient Time For Goal Achievement: 09/22/17 Potential to Achieve Goals: Good ADL Goals Pt Will Perform Lower Body Bathing: with modified independence;sit to/from stand (AE as needed) Pt Will Perform Lower Body Dressing: with modified independence;sit to/from stand (AE as needed) Pt Will Perform Tub/Shower Transfer: Tub transfer;with modified independence;ambulating;3 in 1  OT Frequency: Min 2X/week   Barriers to D/C:            Co-evaluation              AM-PAC PT "6 Clicks" Daily Activity     Outcome Measure Help from another person eating meals?: None Help from another person taking care of personal grooming?: None Help from another person toileting, which includes using toliet, bedpan, or urinal?: None Help from another person bathing (including washing, rinsing, drying)?: A Little Help from another person to put on and taking off regular upper body  clothing?: None Help from another person to put on and taking off regular lower body clothing?: A Little 6 Click Score: 22   End of Session Equipment Utilized During Treatment: Gait belt Nurse Communication: Mobility status  Activity Tolerance: Patient tolerated treatment well Patient left: in chair;with call bell/phone within reach  OT Visit Diagnosis: Other abnormalities of gait and mobility (R26.89)                Time: 1610-9604 OT Time Calculation (min): 16 min Charges:  OT General Charges $OT Visit: 1 Visit OT Evaluation $OT Eval Low Complexity: 1 Low G-Codes:     Cammy Copa, OTS [phone]   Cammy Copa 09/22/2017, 9:37 AM

## 2017-09-22 NOTE — Progress Notes (Signed)
Patient ID: Domenick GongGarlena G Alfrey, female   DOB: 15-Feb-1971, 46 y.o.   MRN: [id number] Subjective: Patient reports appropriate back soreness, no leg pain or NTW  Objective: Vital signs in last 24 hours: Temp:  [97.6 F (36.4 C)-98.5 F (36.9 C)] 98.1 F (36.7 C) (10/25 0400) Pulse Rate:  [60-88] 71 (10/25 0400) Resp:  [7-20] 20 (10/25 0400) BP: (89-138)/(65-89) 119/65 (10/25 0400) SpO2:  [94 %-100 %] 99 % (10/25 0400)  Intake/Output from previous day: 10/24 0701 - 10/25 0700 In: 2683 [P.O.:480; I.V.:1203; IV Piggyback:100] Out: 425 [Urine:275; Blood:150] Intake/Output this shift: No intake/output data recorded.  Neurologic: Grossly normal  Lab Results: Lab Results  Component Value Date   WBC 8.6 09/15/2017   HGB 14.8 09/15/2017   HCT 44.7 09/15/2017   MCV 91.8 09/15/2017   PLT 287 09/15/2017   Lab Results  Component Value Date   INR 1.13 09/15/2017   BMET Lab Results  Component Value Date   NA 136 09/15/2017   K 3.6 09/15/2017   CL 100 (L) 09/15/2017   CO2 28 09/15/2017   GLUCOSE 85 09/15/2017   BUN 12 09/15/2017   CREATININE 1.05 (H) 09/15/2017   CALCIUM 8.7 (L) 09/15/2017    Studies/Results: Dg Lumbar Spine 2-3 Views  Result Date: 09/21/2017 CLINICAL DATA:  L4-5 PLIF EXAM: DG C-ARM 61-120 MIN; LUMBAR SPINE - 2-3 VIEW COMPARISON:  None. FINDINGS: AP and lateral intra procedural fluoroscopy shows L4-5 discectomy with cage (the cage is seen on the lateral view but not the AP view) Bilateral rod and pedicle screw fixation. No unexpected finding. IMPRESSION: Fluoroscopy for L4-5 PLIF. Electronically Signed   By: Marnee SpringJonathon  Watts M.D.   On: 09/21/2017 10:32   Dg C-arm 1-60 Min  Result Date: 09/21/2017 CLINICAL DATA:  L4-5 PLIF EXAM: DG C-ARM 61-120 MIN; LUMBAR SPINE - 2-3 VIEW COMPARISON:  None. FINDINGS: AP and lateral intra procedural fluoroscopy shows L4-5 discectomy with cage (the cage is seen on the lateral view but not the AP view) Bilateral rod and pedicle screw  fixation. No unexpected finding. IMPRESSION: Fluoroscopy for L4-5 PLIF. Electronically Signed   By: Marnee SpringJonathon  Watts M.D.   On: 09/21/2017 10:32    Assessment/Plan: Doing fairly well, stay one more day for pain control and mobilization   LOS: 1 day    Symone Cornman S 09/22/2017, 8:00 AM

## 2017-09-22 NOTE — Anesthesia Postprocedure Evaluation (Signed)
Anesthesia Post Note  Patient: Domenick GongGarlena G Murillo  Procedure(s) Performed: Posterior Lumbar Interbody Fusion - Lumbar four-Lumbar five (N/A Spine Lumbar)     Patient location during evaluation: PACU Anesthesia Type: General Level of consciousness: awake and alert Pain management: pain level controlled Vital Signs Assessment: post-procedure vital signs reviewed and stable Respiratory status: spontaneous breathing, nonlabored ventilation, respiratory function stable and patient connected to nasal cannula oxygen Cardiovascular status: blood pressure returned to baseline and stable Postop Assessment: no apparent nausea or vomiting Anesthetic complications: no    Last Vitals:  Vitals:   09/22/17 0000 09/22/17 0400  BP: 111/65 119/65  Pulse: 76 71  Resp: 18 20  Temp: 36.9 C 36.7 C  SpO2: 98% 99%    Last Pain:  Vitals:   09/22/17 0615  TempSrc:   PainSc: 6                  Caroline Longie

## 2017-09-23 MED ORDER — DEXAMETHASONE 4 MG PO TABS
8.0000 mg | ORAL_TABLET | Freq: Once | ORAL | Status: AC
  Administered 2017-09-23: 8 mg via ORAL
  Filled 2017-09-23: qty 2

## 2017-09-23 MED ORDER — OXYCODONE HCL 5 MG PO TABS: 5.0000 mg | ORAL_TABLET | Freq: Four times a day (QID) | ORAL | 0 refills | Status: DC | PRN

## 2017-09-23 NOTE — Progress Notes (Signed)
Physical Therapy Treatment and d/c  Patient Details Name: Christina GongGarlena G Treu MRN: [id number] DOB: 1970/12/31 Today's Date: 09/23/2017    History of Present Illness Pt is a 46 y/o female s/p L4-5 PLIF. PMH includes depression, anxiety, HTN, and migraine.     PT Comments    Pt progressing well with mobility. She is currently functioning at a modified independence level, with supervision provided for safety on the stairs. Pt anticipates d/c home today. Reviewed walking program, car transfer, brace application/wearing schedule, precautions, and general safety with mobility progression. Will sign off at this time. If needs change, please reconsult.   Follow Up Recommendations  No PT follow up;Supervision for mobility/OOB     Equipment Recommendations  Rolling walker with 5" wheels;3in1 (PT)    Recommendations for Other Services       Precautions / Restrictions Precautions Precautions: Fall;Back Precaution Booklet Issued: Yes (comment) Precaution Comments: History of fall down the steps of her porch. Reviewed back precautions with pt.  Required Braces or Orthoses: Spinal Brace Spinal Brace: Lumbar corset;Applied in sitting position Restrictions Weight Bearing Restrictions: No    Mobility  Bed Mobility               General bed mobility comments: Pt was received walking in the hall  Transfers Overall transfer level: Modified independent Equipment used: None Transfers: Sit to/from Stand           General transfer comment: No assist required. Proper hand placement on seated surface for safety.   Ambulation/Gait Ambulation/Gait assistance: Modified independent (Device/Increase time) Ambulation Distance (Feet): 500 Feet Assistive device: None Gait Pattern/deviations: Step-through pattern;Decreased stride length Gait velocity: Decreased  Gait velocity interpretation: Below normal speed for age/gender General Gait Details: VC's for improved posture, and increased  step/stride length. Pt was able to make corrective changes but continues to appear guarded at times.    Stairs Stairs: Yes   Stair Management: One rail Right;Step to pattern;Forwards Number of Stairs: 10 General stair comments: VC's for sequencing and general safety. Pt was able to complete without assistance.   Wheelchair Mobility    Modified Rankin (Stroke Patients Only)       Balance Overall balance assessment: Needs assistance Sitting-balance support: No upper extremity supported;Feet supported Sitting balance-Leahy Scale: Good     Standing balance support: No upper extremity supported;During functional activity;Single extremity supported Standing balance-Leahy Scale: Fair                              Cognition Arousal/Alertness: Awake/alert Behavior During Therapy: WFL for tasks assessed/performed Overall Cognitive Status: Within Functional Limits for tasks assessed                                        Exercises      General Comments        Pertinent Vitals/Pain Pain Assessment: Faces Faces Pain Scale: Hurts a little bit Pain Location: back  Pain Descriptors / Indicators: Aching;Operative site guarding Pain Intervention(s): Monitored during session    Home Living                      Prior Function            PT Goals (current goals can now be found in the care plan section) Acute Rehab PT Goals Patient Stated Goal: to go home  PT Goal Formulation: With patient Time For Goal Achievement: 09/28/17 Potential to Achieve Goals: Good Progress towards PT goals: Progressing toward goals    Frequency    Min 5X/week      PT Plan Current plan remains appropriate    Co-evaluation              AM-PAC PT "6 Clicks" Daily Activity  Outcome Measure  Difficulty turning over in bed (including adjusting bedclothes, sheets and blankets)?: None Difficulty moving from lying on back to sitting on the side of the  bed? : None Difficulty sitting down on and standing up from a chair with arms (e.g., wheelchair, bedside commode, etc,.)?: None Help needed moving to and from a bed to chair (including a wheelchair)?: None Help needed walking in hospital room?: None Help needed climbing 3-5 steps with a railing? : None 6 Click Score: 24    End of Session Equipment Utilized During Treatment: Back brace Activity Tolerance: Patient tolerated treatment well Patient left: in chair;with call bell/phone within reach Nurse Communication: Mobility status PT Visit Diagnosis: Other abnormalities of gait and mobility (R26.89);Pain Pain - part of body:  (back )     Time: 2956-2130 PT Time Calculation (min) (ACUTE ONLY): 11 min  Charges:  $Gait Training: 8-22 mins                    G Codes:       Conni Slipper, PT, DPT Acute Rehabilitation Services Pager: [phone]    Marylynn Pearson 09/23/2017, 9:48 AM

## 2017-09-23 NOTE — Therapy (Signed)
Occupational Therapy Treatment Patient Details Name: Christina GongGarlena G Potts MRN: [id number] DOB: 05/24/1971 Today's Date: 09/23/2017    History of present illness Pt is a 46 y/o female s/p L4-5 PLIF. PMH includes depression, anxiety, HTN, and migraine.    OT comments  Focus of today's session on increased independence with ADLs and functional mobility. Pt educated on use of AE for ADLs and was able to return demonstration with supervision. Pt is safe to d/c home with intermittent supervision. OT will continue to follow acutely to address established goals.     Follow Up Recommendations  No OT follow up;Supervision - Intermittent    Equipment Recommendations  3 in 1 bedside commode    Recommendations for Other Services      Precautions / Restrictions Precautions Precautions: Fall;Back Precaution Booklet Issued: Yes (comment) Precaution Comments: History of fall down the steps of her porch. Reviewed back precautions with pt.  Required Braces or Orthoses: Spinal Brace Spinal Brace: Lumbar corset;Applied in sitting position Restrictions Weight Bearing Restrictions: No       Mobility Bed Mobility Overal bed mobility: Needs Assistance Bed Mobility: Sit to Sidelying;Rolling Rolling: Modified independent (Device/Increase time)       Sit to sidelying: Supervision General bed mobility comments: Verbal cues for technique. Pt reports some difficulty with log roll   Transfers Overall transfer level: Modified independent Equipment used: None Transfers: Sit to/from Stand           General transfer comment: No assist required. Proper hand placement on seated surface for safety.     Balance Overall balance assessment: Needs assistance Sitting-balance support: No upper extremity supported;Feet supported Sitting balance-Leahy Scale: Good     Standing balance support: No upper extremity supported;During functional activity;Single extremity supported Standing balance-Leahy Scale:  Good                             ADL either performed or assessed with clinical judgement   ADL Overall ADL's : Needs assistance/impaired         Upper Body Bathing: Supervision/ safety;Set up;Sitting   Lower Body Bathing: Supervison/ safety;Set up;Sit to/from stand Lower Body Bathing Details (indicate cue type and reason): Pt educated on use of long handled sponge for LB bathing  Upper Body Dressing : Supervision/safety;Set up;Sitting   Lower Body Dressing: Supervision/safety;Set up;Sit to/from stand Lower Body Dressing Details (indicate cue type and reason): Pt able to don socks using a sock aide. Pt educated on use of reacher for LB clothing      Toileting- Clothing Manipulation and Hygiene: Supervision/safety;Set up;Sit to/from stand       Functional mobility during ADLs: Modified independent General ADL Comments: Pt educated on AE to increase independence with ADLs.      Vision       Perception     Praxis      Cognition Arousal/Alertness: Awake/alert Behavior During Therapy: WFL for tasks assessed/performed Overall Cognitive Status: Within Functional Limits for tasks assessed                                          Exercises     Shoulder Instructions       General Comments Pt up in room without back brace on upon arrival. Pt educated on brace wear schedule     Pertinent Vitals/ Pain  Pain Assessment: Faces Faces Pain Scale: Hurts a little bit Pain Location: back  Pain Descriptors / Indicators: Aching;Operative site guarding Pain Intervention(s): Monitored during session  Home Living                                          Prior Functioning/Environment              Frequency  Min 2X/week        Progress Toward Goals  OT Goals(current goals can now be found in the care plan section)  Progress towards OT goals: Progressing toward goals  Acute Rehab OT Goals Patient Stated Goal: to  go home  OT Goal Formulation: With patient Time For Goal Achievement: 09/22/17 Potential to Achieve Goals: Good ADL Goals Pt Will Perform Lower Body Bathing: with modified independence;sit to/from stand Pt Will Perform Lower Body Dressing: with modified independence;sit to/from stand Pt Will Perform Tub/Shower Transfer: Tub transfer;with modified independence;ambulating;3 in 1  Plan Discharge plan remains appropriate    Co-evaluation                 AM-PAC PT "6 Clicks" Daily Activity     Outcome Measure   Help from another person eating meals?: None Help from another person taking care of personal grooming?: None Help from another person toileting, which includes using toliet, bedpan, or urinal?: None Help from another person bathing (including washing, rinsing, drying)?: A Little Help from another person to put on and taking off regular upper body clothing?: None Help from another person to put on and taking off regular lower body clothing?: A Little 6 Click Score: 22    End of Session Equipment Utilized During Treatment: Gait belt;Back brace  OT Visit Diagnosis: Other abnormalities of gait and mobility (R26.89)   Activity Tolerance Patient tolerated treatment well   Patient Left with call bell/phone within reach;in bed   Nurse Communication Mobility status        Time: 1610-9604 OT Time Calculation (min): 18 min  Charges: OT General Charges $OT Visit: 1 Visit OT Treatments $Self Care/Home Management : 8-22 mins  Cammy Copa, [place] #[phone]   Cammy Copa 09/23/2017, 12:00 PM

## 2017-09-23 NOTE — Progress Notes (Signed)
Patient is discharged from room 3C02 at this time. Alert and in stable condition. IV site d/c'd and instructions read to patient with understanding verbalized. Left unit via wheelchair with mom and all belongings at side.

## 2017-09-23 NOTE — Discharge Summary (Signed)
Physician Discharge Summary  Patient ID: Christina Potts MRN: [id number] DOB/AGE: [birth year] 46 y.o.  Admit date: 09/21/2017 Discharge date: 09/23/2017  Admission Diagnoses: spondylolisthesis    Discharge Diagnoses: same   Discharged Condition: good  Hospital Course: The patient was admitted on 09/21/2017 and taken to the operating room where the patient underwent PLIF L4-5. The patient tolerated the procedure well and was taken to the recovery room and then to the floor in stable condition. The hospital course was routine. There were no complications. The wound remained clean dry and intact. Pt had appropriate back soreness. No complaints of leg pain or new N/T/W. The patient remained afebrile with stable vital signs, and tolerated a regular diet. The patient continued to increase activities, and pain was well controlled with oral pain medications.   Consults: None  Significant Diagnostic Studies:  Results for orders placed or performed during the hospital encounter of 09/15/17  Surgical pcr screen  Result Value Ref Range   MRSA, PCR NEGATIVE NEGATIVE   Staphylococcus aureus NEGATIVE NEGATIVE  Basic metabolic panel  Result Value Ref Range   Sodium 136 135 - 145 mmol/L   Potassium 3.6 3.5 - 5.1 mmol/L   Chloride 100 (L) 101 - 111 mmol/L   CO2 28 22 - 32 mmol/L   Glucose, Bld 85 65 - 99 mg/dL   BUN 12 6 - 20 mg/dL   Creatinine, Ser 6.211.05 (H) 0.44 - 1.00 mg/dL   Calcium 8.7 (L) 8.9 - 10.3 mg/dL   GFR calc non Af Amer >60 >60 mL/min   GFR calc Af Amer >60 >60 mL/min   Anion gap 8 5 - 15  CBC WITH DIFFERENTIAL  Result Value Ref Range   WBC 8.6 4.0 - 10.5 K/uL   RBC 4.87 3.87 - 5.11 MIL/uL   Hemoglobin 14.8 12.0 - 15.0 g/dL   HCT 30.844.7 65.736.0 - 84.646.0 %   MCV 91.8 78.0 - 100.0 fL   MCH 30.4 26.0 - 34.0 pg   MCHC 33.1 30.0 - 36.0 g/dL   RDW 96.212.9 95.211.5 - 84.115.5 %   Platelets 287 150 - 400 K/uL   Neutrophils Relative % 66 %   Neutro Abs 5.6 1.7 - 7.7 K/uL   Lymphocytes Relative 27  %   Lymphs Abs 2.4 0.7 - 4.0 K/uL   Monocytes Relative 6 %   Monocytes Absolute 0.5 0.1 - 1.0 K/uL   Eosinophils Relative 1 %   Eosinophils Absolute 0.1 0.0 - 0.7 K/uL   Basophils Relative 0 %   Basophils Absolute 0.0 0.0 - 0.1 K/uL  Protime-INR  Result Value Ref Range   Prothrombin Time 14.4 11.4 - 15.2 seconds   INR 1.13   Type and screen [facility]  Result Value Ref Range   ABO/RH(D) O POS    Antibody Screen NEG    Sample Expiration 09/29/2017    Extend sample reason NO TRANSFUSIONS OR PREGNANCY IN THE PAST 3 MONTHS   ABO/Rh  Result Value Ref Range   ABO/RH(D) O POS     Dg Lumbar Spine 2-3 Views  Result Date: 09/21/2017 CLINICAL DATA:  L4-5 PLIF EXAM: DG C-ARM 61-120 MIN; LUMBAR SPINE - 2-3 VIEW COMPARISON:  None. FINDINGS: AP and lateral intra procedural fluoroscopy shows L4-5 discectomy with cage (the cage is seen on the lateral view but not the AP view) Bilateral rod and pedicle screw fixation. No unexpected finding. IMPRESSION: Fluoroscopy for L4-5 PLIF. Electronically Signed   By: Kathrynn DuckingJonathon  Watts M.D.  On: 09/21/2017 10:32   Dg C-arm 1-60 Min  Result Date: 09/21/2017 CLINICAL DATA:  L4-5 PLIF EXAM: DG C-ARM 61-120 MIN; LUMBAR SPINE - 2-3 VIEW COMPARISON:  None. FINDINGS: AP and lateral intra procedural fluoroscopy shows L4-5 discectomy with cage (the cage is seen on the lateral view but not the AP view) Bilateral rod and pedicle screw fixation. No unexpected finding. IMPRESSION: Fluoroscopy for L4-5 PLIF. Electronically Signed   By: Marnee Spring M.D.   On: 09/21/2017 10:32    Antibiotics:  Anti-infectives    Start     Dose/Rate Route Frequency Ordered Stop   09/21/17 1530  ceFAZolin (ANCEF) IVPB 2g/100 mL premix     2 g 200 mL/hr over 30 Minutes Intravenous Every 8 hours 09/21/17 1245 09/21/17 2356   09/21/17 1032  vancomycin (VANCOCIN) powder  Status:  Discontinued       As needed 09/21/17 1033 09/21/17 1043   09/21/17 0823  bacitracin 50,000  Units in sodium chloride irrigation 0.9 % 500 mL irrigation  Status:  Discontinued       As needed 09/21/17 0823 09/21/17 1043   09/21/17 0548  ceFAZolin (ANCEF) IVPB 2g/100 mL premix     2 g 200 mL/hr over 30 Minutes Intravenous On call to O.R. 09/21/17 0548 09/21/17 0745      Discharge Exam: Blood pressure 120/71, pulse 65, temperature 98.4 F (36.9 C), resp. rate 16, height 4\' 11"  (1.499 m), weight 87.1 kg (192 lb), SpO2 98 %. Neurologic: Grossly normal Dressing dry  Discharge Medications:   Allergies as of 09/23/2017      Reactions   Dilaudid [hydromorphone Hcl] Anaphylaxis, Shortness Of Breath   Patient reports 09/21/17 she tolerates oxycodone fine   Strawberry (diagnostic) Anaphylaxis, Hives, Swelling   Morphine And Related Rash, Other (See Comments)   "Burns me from the inside out"      Medication List    TAKE these medications   albuterol 108 (90 Base) MCG/ACT inhaler Commonly known as:  PROVENTIL HFA;VENTOLIN HFA Inhale 2 puffs into the lungs every 4 (four) hours as needed for wheezing or shortness of breath.   buPROPion 300 MG 24 hr tablet Commonly known as:  WELLBUTRIN XL Take 300 mg by mouth daily.   diazepam 5 MG tablet Commonly known as:  VALIUM Take 5 mg by mouth 3 (three) times daily as needed for muscle spasms.   losartan-hydrochlorothiazide 50-12.5 MG tablet Commonly known as:  HYZAAR Take 1 tablet by mouth daily.   meloxicam 15 MG tablet Commonly known as:  MOBIC Take 15 mg by mouth daily as needed for pain.   methylphenidate 10 MG tablet Commonly known as:  RITALIN Take 10 mg by mouth 3 (three) times daily as needed (for attention).   ondansetron 4 MG disintegrating tablet Commonly known as:  ZOFRAN-ODT Take 4 mg by mouth every 8 (eight) hours as needed for nausea or vomiting.   oxyCODONE 5 MG immediate release tablet Commonly known as:  Oxy IR/ROXICODONE Take 1 tablet (5 mg total) by mouth every 6 (six) hours as needed for moderate pain  (score 4 to 6).   SUMAtriptan 100 MG tablet Commonly known as:  IMITREX Take 100 mg by mouth every 2 (two) hours as needed for migraine or headache.            Durable Medical Equipment        Start     Ordered   09/22/17 [phone]  For home use only DME Walker youth  Once  Question:  Patient needs a walker to treat with the following condition  Answer:  S/P lumbar spinal fusion   09/22/17 0826   09/21/17 1246  DME 3 n 1  Once     09/21/17 1245      Disposition: home   Final Dx:  PLIF  Discharge Instructions    Call MD for:  difficulty breathing, headache or visual disturbances    Complete by:  As directed    Call MD for:  persistant nausea and vomiting    Complete by:  As directed    Call MD for:  redness, tenderness, or signs of infection (pain, swelling, redness, odor or green/yellow discharge around incision site)    Complete by:  As directed    Call MD for:  severe uncontrolled pain    Complete by:  As directed    Call MD for:  temperature >100.4    Complete by:  As directed    Diet - low sodium heart healthy    Complete by:  As directed    Increase activity slowly    Complete by:  As directed    Remove dressing in 48 hours    Complete by:  As directed          Signed: Tyanna Hach S 09/23/2017, 9:00 AM

## 2017-09-27 ENCOUNTER — Encounter (HOSPITAL_COMMUNITY): Payer: Self-pay | Admitting: Neurological Surgery

## 2017-11-29 HISTORY — PX: LUMBAR FUSION: SHX111

## 2018-04-10 ENCOUNTER — Other Ambulatory Visit: Payer: Self-pay | Admitting: Neurological Surgery

## 2018-05-16 NOTE — Pre-Procedure Instructions (Signed)
Asianae Minkler Duesing  05/16/2018      [id number] - [place], [place] - [address] [place] [address] Phone: [phone] Fax: [phone]  [id number] - [place], [place] - [address] AT [facility] [address] [place] [address] [place] [address] Phone: [phone] Fax: [phone]    Your procedure is scheduled on Thursday 05/25/2018.  Report to [facility] Admitting at 0530 A.M.  Call this number if you have problems the morning of surgery:  [phone]   Remember:  Do not eat or drink after midnight the night before your surgery.     Take these medicines the morning of surgery with A SIP OF WATER: abuterol inhaler - if needed (Bring with you to the hospital) Bupropion (Wellbutrin XL) Meloxicam (Mobic) - if needed Methocarbamol (Robaxin) - if needed Ondansetron (Zofran-odt) - if needed Oxycodone-acetaminophen (Percocet) - if needed Sumatriptan (Imitrex) - if needed  7 days prior to surgery STOP taking any Aspirin(unless otherwise instructed by your surgeon), Aleve, Naproxen, Ibuprofen, Motrin, Advil, Goody's, BC's, all herbal medications, fish oil, and all vitamins     Do not wear jewelry, make-up or nail polish.  Do not wear lotions, powders, or perfumes, or deodorant.  Do not shave 48 hours prior to surgery.    Do not bring valuables to the hospital.  [facility] is not responsible for any belongings or valuables.  Hearing aids, eyeglasses, contacts, dentures or bridgework may not be worn into surgery.  Leave your suitcase in the car.  After surgery it may be brought to your room.  For patients admitted to the hospital, discharge time will be determined by your treatment team.  Patients discharged the day of surgery will not be allowed to drive home.   Name and phone number of your driver:    Special instructions:   [place]- Preparing For Surgery  Before surgery, you can play an important role. Because  skin is not sterile, your skin needs to be as free of germs as possible. You can reduce the number of germs on your skin by washing with CHG (chlorahexidine gluconate) Soap before surgery.  CHG is an antiseptic cleaner which kills germs and bonds with the skin to continue killing germs even after washing.    Oral Hygiene is also important to reduce your risk of infection.  Remember - BRUSH YOUR TEETH THE MORNING OF SURGERY WITH YOUR REGULAR TOOTHPASTE  Please do not use if you have an allergy to CHG or antibacterial soaps. If your skin becomes reddened/irritated stop using the CHG.  Do not shave (including legs and underarms) for at least 48 hours prior to first CHG shower. It is OK to shave your face.  Please follow these instructions carefully.   1. Shower the NIGHT BEFORE SURGERY and the MORNING OF SURGERY with CHG.   2. If you chose to wash your hair, wash your hair first as usual with your normal shampoo.  3. After you shampoo, rinse your hair and body thoroughly to remove the shampoo.  4. Use CHG as you would any other liquid soap. You can apply CHG directly to the skin and wash gently with a scrungie or a clean washcloth.   5. Apply the CHG Soap to your body ONLY FROM THE NECK DOWN.  Do not use on open wounds or open sores. Avoid contact with your eyes, ears, mouth and genitals (private parts).  Wash Face and genitals (private parts)  with your normal soap.  6. Wash thoroughly, paying special attention to the area where your surgery will be performed.  7. Thoroughly rinse your body with warm water from the neck down.  8. DO NOT shower/wash with your normal soap after using and rinsing off the CHG Soap.  9. Pat yourself dry with a CLEAN TOWEL.  10. Wear CLEAN PAJAMAS to bed the night before surgery, wear comfortable clothes the morning of surgery  11. Place CLEAN SHEETS on your bed the night of your first shower and DO NOT SLEEP WITH PETS.    Day of Surgery: Shower as stated  above. Do not apply any deodorants/lotions.  Please wear clean clothes to the hospital/surgery center.   Remember to brush your teeth WITH YOUR REGULAR TOOTHPASTE.    Please read over the following fact sheets that you were given.

## 2018-05-17 ENCOUNTER — Encounter (HOSPITAL_COMMUNITY)
Admission: RE | Admit: 2018-05-17 | Discharge: 2018-05-17 | Disposition: A | Discharge status: Home / Self Care | Payer: 59 | Source: Ambulatory Visit | Attending: Neurological Surgery | Admitting: Neurological Surgery

## 2018-05-17 ENCOUNTER — Ambulatory Visit (HOSPITAL_COMMUNITY)
Admission: RE | Admit: 2018-05-17 | Discharge: 2018-05-17 | Disposition: A | Discharge status: Home / Self Care | Payer: 59 | Source: Ambulatory Visit | Attending: Neurological Surgery | Admitting: Neurological Surgery

## 2018-05-17 ENCOUNTER — Encounter (HOSPITAL_COMMUNITY): Payer: Self-pay

## 2018-05-17 ENCOUNTER — Other Ambulatory Visit (HOSPITAL_COMMUNITY): Payer: Self-pay

## 2018-05-17 DIAGNOSIS — M542 Cervicalgia: Secondary | ICD-10-CM | POA: Insufficient documentation

## 2018-05-17 LAB — PROTIME-INR
INR: 1.01
Prothrombin Time: 13.2 seconds (ref 11.4–15.2)

## 2018-05-17 LAB — BASIC METABOLIC PANEL
Anion gap: 11 (ref 5–15)
BUN: 17 mg/dL (ref 6–20)
CALCIUM: 9 mg/dL (ref 8.9–10.3)
CHLORIDE: 97 mmol/L — AB (ref 101–111)
CO2: 30 mmol/L (ref 22–32)
CREATININE: 0.85 mg/dL (ref 0.44–1.00)
Glucose, Bld: 85 mg/dL (ref 65–99)
Potassium: 3.3 mmol/L — ABNORMAL LOW (ref 3.5–5.1)
Sodium: 138 mmol/L (ref 135–145)

## 2018-05-17 LAB — CBC WITH DIFFERENTIAL/PLATELET
Abs Immature Granulocytes: 0 10*3/uL (ref 0.0–0.1)
BASOS ABS: 0 10*3/uL (ref 0.0–0.1)
BASOS PCT: 0 %
Eosinophils Absolute: 0.1 10*3/uL (ref 0.0–0.7)
Eosinophils Relative: 1 %
HCT: 46.9 % — ABNORMAL HIGH (ref 36.0–46.0)
Hemoglobin: 15.1 g/dL — ABNORMAL HIGH (ref 12.0–15.0)
IMMATURE GRANULOCYTES: 0 %
Lymphocytes Relative: 20 %
Lymphs Abs: 1.9 10*3/uL (ref 0.7–4.0)
MCH: 30 pg (ref 26.0–34.0)
MCHC: 32.2 g/dL (ref 30.0–36.0)
MCV: 93.2 fL (ref 78.0–100.0)
Monocytes Absolute: 0.7 10*3/uL (ref 0.1–1.0)
Monocytes Relative: 7 %
NEUTROS PCT: 72 %
Neutro Abs: 6.5 10*3/uL (ref 1.7–7.7)
PLATELETS: 321 10*3/uL (ref 150–400)
RBC: 5.03 MIL/uL (ref 3.87–5.11)
RDW: 12.5 % (ref 11.5–15.5)
WBC: 9.2 10*3/uL (ref 4.0–10.5)

## 2018-05-17 LAB — TYPE AND SCREEN
ABO/RH(D): O POS
Antibody Screen: NEGATIVE

## 2018-05-17 LAB — SURGICAL PCR SCREEN
MRSA, PCR: NEGATIVE
STAPHYLOCOCCUS AUREUS: NEGATIVE

## 2018-05-17 NOTE — Progress Notes (Signed)
PCP - Dr. Quillian QuinceBliss Cardiologist - patient denies  Chest x-ray - 05/17/2018 EKG - 05/17/2018 Stress Test - patient unsure ECHO - patient unsure Cardiac Cath - patient unsure  Sleep Study - patient denies  Blood Thinner Instructions: n/a Aspirin Instructions: n/a  Anesthesia review: n/a  Patient denies shortness of breath, fever, cough and chest pain at PAT appointment   Patient verbalized understanding of instructions that were given to them at the PAT appointment. Patient was also instructed that they will need to review over the PAT instructions again at home before surgery.

## 2018-05-24 NOTE — Anesthesia Preprocedure Evaluation (Addendum)
Anesthesia Evaluation  Patient identified by MRN, date of birth, ID band Patient awake    Reviewed: Allergy & Precautions, NPO status , Patient's Chart, lab work & pertinent test results  History of Anesthesia Complications (+) PONV and history of anesthetic complications  Airway Mallampati: II  TM Distance: >3 FB Neck ROM: Limited    Dental  (+) Teeth Intact, Dental Advisory Given   Pulmonary asthma ,    breath sounds clear to auscultation       Cardiovascular hypertension, Pt. on medications  Rhythm:Regular Rate:Normal     Neuro/Psych  Headaches, PSYCHIATRIC DISORDERS Anxiety Depression    GI/Hepatic Neg liver ROS, GERD  Controlled,IBS   Endo/Other  Morbid obesity  Renal/GU negative Renal ROS     Musculoskeletal  (+) Arthritis ,   Abdominal (+) + obese,   Peds  (+) ATTENTION DEFICIT DISORDER WITHOUT HYPERACTIVITY Hematology negative hematology ROS (+)   Anesthesia Other Findings   Reproductive/Obstetrics                            Anesthesia Physical  Anesthesia Plan  ASA: III  Anesthesia Plan: General   Post-op Pain Management:    Induction: Intravenous  PONV Risk Score and Plan: 4 or greater and Ondansetron, Dexamethasone, Midazolam, Scopolamine patch - Pre-op, Propofol infusion and Treatment may vary due to age or medical condition  Airway Management Planned: Oral ETT and Video Laryngoscope Planned  Additional Equipment: None  Intra-op Plan:   Post-operative Plan: Extubation in OR  Informed Consent: I have reviewed the patients History and Physical, chart, labs and discussed the procedure including the risks, benefits and alternatives for the proposed anesthesia with the patient or authorized representative who has indicated his/her understanding and acceptance.   Dental advisory given  Plan Discussed with: CRNA and Anesthesiologist  Anesthesia Plan Comments:          Anesthesia Quick Evaluation

## 2018-05-25 ENCOUNTER — Other Ambulatory Visit: Payer: Self-pay

## 2018-05-25 ENCOUNTER — Inpatient Hospital Stay (HOSPITAL_COMMUNITY): Payer: 59 | Admitting: Anesthesiology

## 2018-05-25 ENCOUNTER — Inpatient Hospital Stay (HOSPITAL_COMMUNITY)
Admission: RE | Admit: 2018-05-25 | Discharge: 2018-05-26 | DRG: 473 | Disposition: A | Discharge status: Home / Self Care | Payer: 59 | Source: Ambulatory Visit | Attending: Neurological Surgery | Admitting: Neurological Surgery

## 2018-05-25 ENCOUNTER — Encounter (HOSPITAL_COMMUNITY): Payer: Self-pay | Admitting: Anesthesiology

## 2018-05-25 ENCOUNTER — Inpatient Hospital Stay (HOSPITAL_COMMUNITY): Payer: 59

## 2018-05-25 ENCOUNTER — Encounter (HOSPITAL_COMMUNITY)
Admission: RE | Disposition: A | Discharge status: Home / Self Care | Payer: Self-pay | Source: Ambulatory Visit | Attending: Neurological Surgery

## 2018-05-25 DIAGNOSIS — M4322 Fusion of spine, cervical region: Secondary | ICD-10-CM | POA: Diagnosis present

## 2018-05-25 DIAGNOSIS — R402414 Glasgow coma scale score 13-15, 24 hours or more after hospital admission: Secondary | ICD-10-CM | POA: Diagnosis present

## 2018-05-25 DIAGNOSIS — J45909 Unspecified asthma, uncomplicated: Secondary | ICD-10-CM | POA: Diagnosis present

## 2018-05-25 DIAGNOSIS — M1990 Unspecified osteoarthritis, unspecified site: Secondary | ICD-10-CM | POA: Diagnosis present

## 2018-05-25 DIAGNOSIS — G43909 Migraine, unspecified, not intractable, without status migrainosus: Secondary | ICD-10-CM | POA: Diagnosis present

## 2018-05-25 DIAGNOSIS — M4802 Spinal stenosis, cervical region: Secondary | ICD-10-CM | POA: Diagnosis present

## 2018-05-25 DIAGNOSIS — R200 Anesthesia of skin: Secondary | ICD-10-CM | POA: Diagnosis present

## 2018-05-25 DIAGNOSIS — F329 Major depressive disorder, single episode, unspecified: Secondary | ICD-10-CM | POA: Diagnosis present

## 2018-05-25 DIAGNOSIS — M47812 Spondylosis without myelopathy or radiculopathy, cervical region: Secondary | ICD-10-CM | POA: Diagnosis present

## 2018-05-25 DIAGNOSIS — M50323 Other cervical disc degeneration at C6-C7 level: Secondary | ICD-10-CM | POA: Diagnosis present

## 2018-05-25 DIAGNOSIS — F988 Other specified behavioral and emotional disorders with onset usually occurring in childhood and adolescence: Secondary | ICD-10-CM | POA: Diagnosis present

## 2018-05-25 DIAGNOSIS — I10 Essential (primary) hypertension: Secondary | ICD-10-CM | POA: Diagnosis present

## 2018-05-25 DIAGNOSIS — Z9189 Other specified personal risk factors, not elsewhere classified: Secondary | ICD-10-CM | POA: Diagnosis not present

## 2018-05-25 DIAGNOSIS — F419 Anxiety disorder, unspecified: Secondary | ICD-10-CM | POA: Diagnosis present

## 2018-05-25 DIAGNOSIS — Z6839 Body mass index (BMI) 39.0-39.9, adult: Secondary | ICD-10-CM

## 2018-05-25 DIAGNOSIS — Z79899 Other long term (current) drug therapy: Secondary | ICD-10-CM | POA: Diagnosis not present

## 2018-05-25 DIAGNOSIS — Z885 Allergy status to narcotic agent status: Secondary | ICD-10-CM

## 2018-05-25 DIAGNOSIS — M50322 Other cervical disc degeneration at C5-C6 level: Secondary | ICD-10-CM | POA: Diagnosis present

## 2018-05-25 DIAGNOSIS — Z419 Encounter for procedure for purposes other than remedying health state, unspecified: Secondary | ICD-10-CM

## 2018-05-25 DIAGNOSIS — Z888 Allergy status to other drugs, medicaments and biological substances status: Secondary | ICD-10-CM

## 2018-05-25 DIAGNOSIS — Z91018 Allergy to other foods: Secondary | ICD-10-CM | POA: Diagnosis not present

## 2018-05-25 HISTORY — PX: ANTERIOR CERVICAL DECOMP/DISCECTOMY FUSION: SHX1161

## 2018-05-25 SURGERY — ANTERIOR CERVICAL DECOMPRESSION/DISCECTOMY FUSION 3 LEVELS
Anesthesia: General | Site: Spine Cervical

## 2018-05-25 MED ORDER — ROCURONIUM BROMIDE 50 MG/5ML IV SOLN
INTRAVENOUS | Status: AC
  Filled 2018-05-25: qty 1

## 2018-05-25 MED ORDER — POTASSIUM CHLORIDE IN NACL 20-0.9 MEQ/L-% IV SOLN
INTRAVENOUS | Status: DC
  Administered 2018-05-25: 14:00:00 via INTRAVENOUS

## 2018-05-25 MED ORDER — PROMETHAZINE HCL 25 MG/ML IJ SOLN
INTRAMUSCULAR | Status: AC
  Administered 2018-05-25: 6.25 mg via INTRAVENOUS
  Filled 2018-05-25: qty 1

## 2018-05-25 MED ORDER — BUPROPION HCL ER (XL) 300 MG PO TB24
300.0000 mg | ORAL_TABLET | Freq: Every day | ORAL | Status: DC
  Administered 2018-05-25 – 2018-05-26 (×2): 300 mg via ORAL
  Filled 2018-05-25 (×2): qty 1

## 2018-05-25 MED ORDER — ALBUTEROL SULFATE (2.5 MG/3ML) 0.083% IN NEBU
2.5000 mg | INHALATION_SOLUTION | RESPIRATORY_TRACT | Status: DC | PRN
Start: 2018-05-25 — End: 2018-05-26

## 2018-05-25 MED ORDER — SENNA 8.6 MG PO TABS
1.0000 | ORAL_TABLET | Freq: Two times a day (BID) | ORAL | Status: DC
  Administered 2018-05-25 – 2018-05-26 (×3): 8.6 mg via ORAL
  Filled 2018-05-25 (×3): qty 1

## 2018-05-25 MED ORDER — SUGAMMADEX SODIUM 200 MG/2ML IV SOLN
INTRAVENOUS | Status: DC | PRN
  Administered 2018-05-25: 100 mg via INTRAVENOUS
  Administered 2018-05-25: 75 mg via INTRAVENOUS

## 2018-05-25 MED ORDER — ONDANSETRON HCL 4 MG/2ML IJ SOLN
INTRAMUSCULAR | Status: DC | PRN
  Administered 2018-05-25: 4 mg via INTRAVENOUS

## 2018-05-25 MED ORDER — OXYCODONE HCL 5 MG PO TABS: 5.0000 mg | ORAL_TABLET | Freq: Once | ORAL | Status: DC | PRN

## 2018-05-25 MED ORDER — ONDANSETRON HCL 4 MG/2ML IJ SOLN
INTRAMUSCULAR | Status: AC
  Filled 2018-05-25: qty 2

## 2018-05-25 MED ORDER — LIDOCAINE HCL (CARDIAC) PF 100 MG/5ML IV SOSY
PREFILLED_SYRINGE | INTRAVENOUS | Status: DC | PRN
  Administered 2018-05-25: 20 mg via INTRAVENOUS
  Administered 2018-05-25: 80 mg via INTRAVENOUS

## 2018-05-25 MED ORDER — PHENYLEPHRINE 40 MCG/ML (10ML) SYRINGE FOR IV PUSH (FOR BLOOD PRESSURE SUPPORT)
PREFILLED_SYRINGE | INTRAVENOUS | Status: DC | PRN
  Administered 2018-05-25 (×3): 40 ug via INTRAVENOUS

## 2018-05-25 MED ORDER — MIDAZOLAM HCL 5 MG/5ML IJ SOLN
INTRAMUSCULAR | Status: DC | PRN
  Administered 2018-05-25: 2 mg via INTRAVENOUS

## 2018-05-25 MED ORDER — LIDOCAINE 2% (20 MG/ML) 5 ML SYRINGE
INTRAMUSCULAR | Status: AC
  Filled 2018-05-25: qty 5

## 2018-05-25 MED ORDER — CHLORHEXIDINE GLUCONATE CLOTH 2 % EX PADS: 6.0000 | MEDICATED_PAD | Freq: Once | CUTANEOUS | Status: DC

## 2018-05-25 MED ORDER — METHOCARBAMOL 500 MG PO TABS
500.0000 mg | ORAL_TABLET | Freq: Four times a day (QID) | ORAL | Status: DC | PRN
  Administered 2018-05-25 – 2018-05-26 (×4): 500 mg via ORAL
  Filled 2018-05-25 (×4): qty 1

## 2018-05-25 MED ORDER — SUGAMMADEX SODIUM 200 MG/2ML IV SOLN
INTRAVENOUS | Status: AC
  Filled 2018-05-25: qty 2

## 2018-05-25 MED ORDER — MOMETASONE FUROATE 0.1 % EX CREA
TOPICAL_CREAM | Freq: Every day | CUTANEOUS | Status: DC | PRN
  Administered 2018-05-25: 23:00:00 via TOPICAL
  Filled 2018-05-25 (×2): qty 15

## 2018-05-25 MED ORDER — PHENYLEPHRINE 40 MCG/ML (10ML) SYRINGE FOR IV PUSH (FOR BLOOD PRESSURE SUPPORT)
PREFILLED_SYRINGE | INTRAVENOUS | Status: AC
  Filled 2018-05-25: qty 10

## 2018-05-25 MED ORDER — METHYLPHENIDATE HCL 5 MG PO TABS
20.0000 mg | ORAL_TABLET | Freq: Two times a day (BID) | ORAL | Status: DC
  Administered 2018-05-25: 20 mg via ORAL

## 2018-05-25 MED ORDER — ACETAMINOPHEN 325 MG PO TABS
650.0000 mg | ORAL_TABLET | ORAL | Status: DC | PRN
Start: 2018-05-25 — End: 2018-05-26
  Administered 2018-05-26 (×2): 650 mg via ORAL
  Filled 2018-05-25 (×2): qty 2

## 2018-05-25 MED ORDER — SODIUM CHLORIDE 0.9% FLUSH: 3.0000 mL | Freq: Two times a day (BID) | INTRAVENOUS | Status: DC

## 2018-05-25 MED ORDER — SCOPOLAMINE 1 MG/3DAYS TD PT72
MEDICATED_PATCH | TRANSDERMAL | Status: AC
  Filled 2018-05-25: qty 1

## 2018-05-25 MED ORDER — CEFAZOLIN SODIUM-DEXTROSE 2-4 GM/100ML-% IV SOLN
INTRAVENOUS | Status: AC
  Filled 2018-05-25: qty 100

## 2018-05-25 MED ORDER — THROMBIN 5000 UNITS EX SOLR
CUTANEOUS | Status: AC
  Filled 2018-05-25: qty 5000

## 2018-05-25 MED ORDER — FENTANYL CITRATE (PF) 250 MCG/5ML IJ SOLN
INTRAMUSCULAR | Status: AC
  Filled 2018-05-25: qty 5

## 2018-05-25 MED ORDER — PROPOFOL 10 MG/ML IV BOLUS
INTRAVENOUS | Status: DC | PRN
  Administered 2018-05-25: 200 mg via INTRAVENOUS

## 2018-05-25 MED ORDER — THROMBIN 20000 UNITS EX SOLR
CUTANEOUS | Status: AC
  Filled 2018-05-25: qty 20000

## 2018-05-25 MED ORDER — SCOPOLAMINE 1 MG/3DAYS TD PT72
MEDICATED_PATCH | TRANSDERMAL | Status: DC | PRN
  Administered 2018-05-25: 1 via TRANSDERMAL

## 2018-05-25 MED ORDER — DEXAMETHASONE SODIUM PHOSPHATE 10 MG/ML IJ SOLN
10.0000 mg | INTRAMUSCULAR | Status: AC
  Administered 2018-05-25: 10 mg via INTRAVENOUS

## 2018-05-25 MED ORDER — LOSARTAN POTASSIUM 50 MG PO TABS
50.0000 mg | ORAL_TABLET | Freq: Every day | ORAL | Status: DC
  Administered 2018-05-25: 50 mg via ORAL
  Filled 2018-05-25: qty 1

## 2018-05-25 MED ORDER — DEXAMETHASONE SODIUM PHOSPHATE 10 MG/ML IJ SOLN
INTRAMUSCULAR | Status: AC
  Filled 2018-05-25: qty 1

## 2018-05-25 MED ORDER — LACTATED RINGERS IV SOLN
INTRAVENOUS | Status: DC | PRN
  Administered 2018-05-25 (×2): via INTRAVENOUS

## 2018-05-25 MED ORDER — CEFAZOLIN SODIUM-DEXTROSE 2-4 GM/100ML-% IV SOLN
2.0000 g | INTRAVENOUS | Status: AC
  Administered 2018-05-25: 2 g via INTRAVENOUS

## 2018-05-25 MED ORDER — LOSARTAN POTASSIUM-HCTZ 50-12.5 MG PO TABS: 1.0000 | ORAL_TABLET | Freq: Every day | ORAL | Status: DC

## 2018-05-25 MED ORDER — ONDANSETRON HCL 4 MG/2ML IJ SOLN
4.0000 mg | Freq: Once | INTRAMUSCULAR | Status: AC | PRN
  Administered 2018-05-25: 4 mg via INTRAVENOUS

## 2018-05-25 MED ORDER — FENTANYL CITRATE (PF) 100 MCG/2ML IJ SOLN
INTRAMUSCULAR | Status: DC | PRN
  Administered 2018-05-25: 50 ug via INTRAVENOUS
  Administered 2018-05-25: 100 ug via INTRAVENOUS

## 2018-05-25 MED ORDER — METHOCARBAMOL 1000 MG/10ML IJ SOLN
500.0000 mg | Freq: Four times a day (QID) | INTRAVENOUS | Status: DC | PRN
  Filled 2018-05-25: qty 5

## 2018-05-25 MED ORDER — CEFAZOLIN SODIUM-DEXTROSE 2-4 GM/100ML-% IV SOLN
2.0000 g | Freq: Three times a day (TID) | INTRAVENOUS | Status: AC
  Administered 2018-05-25 (×2): 2 g via INTRAVENOUS
  Filled 2018-05-25 (×2): qty 100

## 2018-05-25 MED ORDER — MIDAZOLAM HCL 2 MG/2ML IJ SOLN
INTRAMUSCULAR | Status: AC
  Filled 2018-05-25: qty 2

## 2018-05-25 MED ORDER — PROMETHAZINE HCL 25 MG/ML IJ SOLN
6.2500 mg | Freq: Once | INTRAMUSCULAR | Status: AC
  Administered 2018-05-25: 6.25 mg via INTRAVENOUS

## 2018-05-25 MED ORDER — ONDANSETRON HCL 4 MG PO TABS: 4.0000 mg | ORAL_TABLET | Freq: Four times a day (QID) | ORAL | Status: DC | PRN

## 2018-05-25 MED ORDER — SODIUM CHLORIDE 0.9% FLUSH: 3.0000 mL | INTRAVENOUS | Status: DC | PRN

## 2018-05-25 MED ORDER — ONDANSETRON HCL 4 MG/2ML IJ SOLN: 4.0000 mg | Freq: Four times a day (QID) | INTRAMUSCULAR | Status: DC | PRN

## 2018-05-25 MED ORDER — PHENOL 1.4 % MT LIQD: 1.0000 | OROMUCOSAL | Status: DC | PRN

## 2018-05-25 MED ORDER — ROCURONIUM BROMIDE 100 MG/10ML IV SOLN
INTRAVENOUS | Status: DC | PRN
  Administered 2018-05-25: 20 mg via INTRAVENOUS
  Administered 2018-05-25: 50 mg via INTRAVENOUS

## 2018-05-25 MED ORDER — DEXAMETHASONE 4 MG PO TABS
4.0000 mg | ORAL_TABLET | Freq: Four times a day (QID) | ORAL | Status: DC
  Administered 2018-05-25 – 2018-05-26 (×4): 4 mg via ORAL
  Filled 2018-05-25 (×4): qty 1

## 2018-05-25 MED ORDER — ACETAMINOPHEN 650 MG RE SUPP: 650.0000 mg | RECTAL | Status: DC | PRN

## 2018-05-25 MED ORDER — 0.9 % SODIUM CHLORIDE (POUR BTL) OPTIME
TOPICAL | Status: DC | PRN
  Administered 2018-05-25: 1000 mL

## 2018-05-25 MED ORDER — OXYCODONE HCL 5 MG PO TABS
5.0000 mg | ORAL_TABLET | ORAL | Status: DC | PRN
  Administered 2018-05-25: 5 mg via ORAL
  Filled 2018-05-25: qty 1

## 2018-05-25 MED ORDER — PHENYLEPHRINE HCL 10 MG/ML IJ SOLN
INTRAVENOUS | Status: DC | PRN
  Administered 2018-05-25: 20 ug/min via INTRAVENOUS

## 2018-05-25 MED ORDER — HYDROCHLOROTHIAZIDE 12.5 MG PO CAPS
12.5000 mg | ORAL_CAPSULE | Freq: Every day | ORAL | Status: DC
  Administered 2018-05-25: 12.5 mg via ORAL
  Filled 2018-05-25: qty 1

## 2018-05-25 MED ORDER — DEXAMETHASONE SODIUM PHOSPHATE 4 MG/ML IJ SOLN
4.0000 mg | Freq: Four times a day (QID) | INTRAMUSCULAR | Status: DC
  Administered 2018-05-25: 4 mg via INTRAVENOUS
  Filled 2018-05-25: qty 1

## 2018-05-25 MED ORDER — PROPOFOL 10 MG/ML IV BOLUS
INTRAVENOUS | Status: AC
  Filled 2018-05-25: qty 20

## 2018-05-25 MED ORDER — OXYCODONE HCL 5 MG/5ML PO SOLN: 5.0000 mg | Freq: Once | ORAL | Status: DC | PRN

## 2018-05-25 MED ORDER — THROMBIN 5000 UNITS EX SOLR
OROMUCOSAL | Status: DC | PRN
  Administered 2018-05-25: 5 mL via TOPICAL

## 2018-05-25 MED ORDER — BUPIVACAINE HCL (PF) 0.25 % IJ SOLN
INTRAMUSCULAR | Status: AC
  Filled 2018-05-25: qty 30

## 2018-05-25 MED ORDER — FENTANYL CITRATE (PF) 100 MCG/2ML IJ SOLN
INTRAMUSCULAR | Status: AC
  Administered 2018-05-25: 50 ug via INTRAVENOUS
  Filled 2018-05-25: qty 2

## 2018-05-25 MED ORDER — BUPIVACAINE HCL (PF) 0.25 % IJ SOLN
INTRAMUSCULAR | Status: DC | PRN
  Administered 2018-05-25: 4 mL

## 2018-05-25 MED ORDER — MENTHOL 3 MG MT LOZG
1.0000 | LOZENGE | OROMUCOSAL | Status: DC | PRN
  Administered 2018-05-25: 3 mg via ORAL
  Filled 2018-05-25 (×2): qty 9

## 2018-05-25 MED ORDER — FENTANYL CITRATE (PF) 100 MCG/2ML IJ SOLN
25.0000 ug | INTRAMUSCULAR | Status: DC | PRN
  Administered 2018-05-25 (×2): 50 ug via INTRAVENOUS

## 2018-05-25 MED ORDER — MORPHINE SULFATE (PF) 2 MG/ML IV SOLN: 2.0000 mg | INTRAVENOUS | Status: DC | PRN

## 2018-05-25 MED ORDER — OXYCODONE HCL 5 MG PO TABS
5.0000 mg | ORAL_TABLET | ORAL | Status: DC | PRN
  Administered 2018-05-25 – 2018-05-26 (×6): 10 mg via ORAL
  Filled 2018-05-25 (×6): qty 2

## 2018-05-25 MED ORDER — SODIUM CHLORIDE 0.9 % IV SOLN
INTRAVENOUS | Status: DC | PRN
  Administered 2018-05-25: 500 mL

## 2018-05-25 MED ORDER — ACETAMINOPHEN 10 MG/ML IV SOLN
1000.0000 mg | INTRAVENOUS | Status: AC
  Administered 2018-05-25: 1000 mg via INTRAVENOUS
  Filled 2018-05-25: qty 100

## 2018-05-25 SURGICAL SUPPLY — 59 items
BAG DECANTER FOR FLEXI CONT (MISCELLANEOUS) ×2 IMPLANT
BASKET BONE COLLECTION (BASKET) ×2 IMPLANT
BENZOIN TINCTURE PRP APPL 2/3 (GAUZE/BANDAGES/DRESSINGS) ×2 IMPLANT
BIT DRILL 13 (BIT) ×2 IMPLANT
BUR MATCHSTICK NEURO 3.0 LAGG (BURR) ×2 IMPLANT
CAGE PEEK 5X14X11 (Cage) ×1 IMPLANT
CAGE PEEK 6X14X11 (Cage) ×4 IMPLANT
CAGE SPNL 11X14X5XRADOPQ (Cage) ×1 IMPLANT
CANISTER SUCT 3000ML PPV (MISCELLANEOUS) ×2 IMPLANT
CARTRIDGE OIL MAESTRO DRILL (MISCELLANEOUS) ×1 IMPLANT
DIFFUSER DRILL AIR PNEUMATIC (MISCELLANEOUS) ×2 IMPLANT
DRAPE C-ARM 42X72 X-RAY (DRAPES) ×4 IMPLANT
DRAPE LAPAROTOMY 100X72 PEDS (DRAPES) ×2 IMPLANT
DRAPE MICROSCOPE LEICA (MISCELLANEOUS) ×2 IMPLANT
DRSG OPSITE POSTOP 3X4 (GAUZE/BANDAGES/DRESSINGS) ×2 IMPLANT
DURAPREP 6ML APPLICATOR 50/CS (WOUND CARE) ×2 IMPLANT
ELECT COATED BLADE 2.86 ST (ELECTRODE) ×2 IMPLANT
ELECT REM PT RETURN 9FT ADLT (ELECTROSURGICAL) ×2
ELECTRODE REM PT RTRN 9FT ADLT (ELECTROSURGICAL) ×1 IMPLANT
GAUZE SPONGE 4X4 16PLY XRAY LF (GAUZE/BANDAGES/DRESSINGS) IMPLANT
GLOVE BIO SURGEON STRL SZ7 (GLOVE) ×2 IMPLANT
GLOVE BIO SURGEON STRL SZ8 (GLOVE) ×4 IMPLANT
GLOVE BIOGEL PI IND STRL 7.0 (GLOVE) ×1 IMPLANT
GLOVE BIOGEL PI IND STRL 7.5 (GLOVE) ×1 IMPLANT
GLOVE BIOGEL PI IND STRL 8.5 (GLOVE) ×1 IMPLANT
GLOVE BIOGEL PI INDICATOR 7.0 (GLOVE) ×1
GLOVE BIOGEL PI INDICATOR 7.5 (GLOVE) ×1
GLOVE BIOGEL PI INDICATOR 8.5 (GLOVE) ×1
GLOVE SURG SS PI 7.0 STRL IVOR (GLOVE) ×4 IMPLANT
GOWN STRL REUS W/ TWL LRG LVL3 (GOWN DISPOSABLE) ×3 IMPLANT
GOWN STRL REUS W/ TWL XL LVL3 (GOWN DISPOSABLE) ×2 IMPLANT
GOWN STRL REUS W/TWL 2XL LVL3 (GOWN DISPOSABLE) IMPLANT
GOWN STRL REUS W/TWL LRG LVL3 (GOWN DISPOSABLE) ×3
GOWN STRL REUS W/TWL XL LVL3 (GOWN DISPOSABLE) ×2
HEMOSTAT POWDER KIT SURGIFOAM (HEMOSTASIS) ×2 IMPLANT
KIT BASIN OR (CUSTOM PROCEDURE TRAY) ×2 IMPLANT
KIT TURNOVER KIT B (KITS) ×2 IMPLANT
NEEDLE HYPO 25X1 1.5 SAFETY (NEEDLE) ×2 IMPLANT
NEEDLE SPNL 20GX3.5 QUINCKE YW (NEEDLE) ×2 IMPLANT
NS IRRIG 1000ML POUR BTL (IV SOLUTION) ×2 IMPLANT
OIL CARTRIDGE MAESTRO DRILL (MISCELLANEOUS) ×2
PACK LAMINECTOMY NEURO (CUSTOM PROCEDURE TRAY) ×2 IMPLANT
PAD ARMBOARD 7.5X6 YLW CONV (MISCELLANEOUS) ×8 IMPLANT
PIN DISTRACTION 14MM (PIN) ×4 IMPLANT
PLATE ANT CERVICAL 52.5 (Plate) ×2 IMPLANT
RUBBERBAND STERILE (MISCELLANEOUS) ×4 IMPLANT
SCREW 4.0X13 (Screw) ×2 IMPLANT
SCREW BN 13X4XSLF DRL FXANG (Screw) ×2 IMPLANT
SCREW FIXED SD 4X11 (Screw) ×8 IMPLANT
SCREW ST FIX 4 ATL 3120213 (Screw) ×4 IMPLANT
SPONGE INTESTINAL PEANUT (DISPOSABLE) ×2 IMPLANT
SPONGE SURGIFOAM ABS GEL 100 (HEMOSTASIS) IMPLANT
STRIP CLOSURE SKIN 1/2X4 (GAUZE/BANDAGES/DRESSINGS) ×2 IMPLANT
SUT VIC AB 3-0 SH 8-18 (SUTURE) ×4 IMPLANT
SUT VIC AB 4-0 PS2 18 (SUTURE) ×2 IMPLANT
SUT VICRYL 4-0 PS2 18IN ABS (SUTURE) IMPLANT
TOWEL GREEN STERILE (TOWEL DISPOSABLE) ×2 IMPLANT
TOWEL GREEN STERILE FF (TOWEL DISPOSABLE) ×2 IMPLANT
WATER STERILE IRR 1000ML POUR (IV SOLUTION) ×2 IMPLANT

## 2018-05-25 NOTE — Op Note (Signed)
05/25/2018  10:16 AM  PATIENT:  Christina Potts  47 y.o. female  PRE-OPERATIVE DIAGNOSIS:  Cervical spondylosis with grade 1 subluxation C4-5 with left foraminal stenosis, C5-6 degenerative disc disease and disc bulging with bi-foraminal stenosis, C6-7 degenerative disc disease and spondylosis with right foraminal stenosis, neck and arm pain  POST-OPERATIVE DIAGNOSIS:  same  PROCEDURE:  1. Decompressive anterior cervical discectomy C4-5 and C5-6 C6-7, 2. Anterior cervical arthrodesis C4-5 C5-6 C6-7 utilizing a peek interbody cage packed with locally harvested morcellized autologous bone graft, 3. Anterior cervical plating C4-C7 inclusive utilizing a Atlantis translational plate  SURGEON:  Marikay Alar, MD  ASSISTANTS: Dr. Venetia Maxon  ANESTHESIA:   General  EBL: 50 ml  Total I/O In: 1400 [I.V.:1400] Out: 50 [Blood:50]  BLOOD ADMINISTERED: none  DRAINS: none  SPECIMEN:  none  INDICATION FOR PROCEDURE: This patient presented with neck and arm pain. Imaging showed cervical spondylosis. The patient tried conservative measures without relief. Pain was debilitating. Recommended ACDF with plating. Patient understood the risks, benefits, and alternatives and potential outcomes and wished to proceed.  PROCEDURE DETAILS: Patient was brought to the operating room placed under general endotracheal anesthesia. Patient was placed in the supine position on the operating room table. The neck was prepped with Duraprep and draped in a sterile fashion.   Three cc of local anesthesia was injected and a transverse incision was made on the right side of the neck.  Dissection was carried down thru the subcutaneous tissue and the platysma was  elevated, opened, and undermined with Metzenbaum scissors.  Dissection was then carried out thru an avascular plane leaving the sternocleidomastoid carotid artery and jugular vein laterally and the trachea and esophagus medially. The ventral aspect of the vertebral  column was identified and a localizing x-ray was taken. The C5-6 level was identified. The longus colli muscles were then elevated and the retractor was placed to expose C4-5 C5-6 and C6-7. The annulus of C5 C5 6 and C6-7 was incised and the disc space entered. Discectomy was performed with micro-curettes and pituitary rongeurs. I then used the high-speed drill to drill the endplates down to the level of the posterior longitudinal ligament. The exact same decompression was done at all 3 levels. The drill shavings were saved in a mucous trap for later arthrodesis. The operating microscope was draped and brought into the field provided additional magnification, illumination and visualization. Discectomy was continued posteriorly thru the disc space. Posterior longitudinal ligament was opened with a nerve hook, and then removed along with disc herniation and osteophytes, decompressing the spinal canal and thecal sac. We then continued to remove osteophytic overgrowth and disc material decompressing the neural foramina and exiting nerve roots bilaterally. The scope was angled up and down to help decompress and undercut the vertebral bodies. Once the decompression was completed we could pass a nerve hook circumferentially to assure adequate decompression in the midline and in the neural foramina. So by both visualization and palpation we felt we had an adequate decompression of the neural elements. We then measured the height of the intravertebral disc space and selected a 6 millimeter Peek interbody cage packed with autograft. It was then gently positioned in the intravertebral disc space of all 3 levels and countersunk. I then used a 52 mm Atlantis translational plate and placed fixed angle screws into the vertebral bodies of each level from C4-C7 inclusive and locked them into position. The wound was irrigated with bacitracin solution, checked for hemostasis which was established and confirmed. Once  meticulous  hemostasis was achieved, we then proceeded with closure. The platysma was closed with interrupted 3-0 undyed Vicryl suture, the subcuticular layer was closed with interrupted 3-0 undyed Vicryl suture. The skin edges were approximated with steristrips. The drapes were removed. A sterile dressing was applied. The patient was then awakened from general anesthesia and transferred to the recovery room in stable condition. At the end of the procedure all sponge, needle and instrument counts were correct.   PLAN OF CARE: Admit for overnight observation  PATIENT DISPOSITION:  PACU - hemodynamically stable.   Delay start of Pharmacological VTE agent (>24hrs) due to surgical blood loss or risk of bleeding:  yes

## 2018-05-25 NOTE — Transfer of Care (Signed)
Immediate Anesthesia Transfer of Care Note  Patient: Christina Potts  Procedure(s) Performed: ANTERIOR CERVICAL DECOMPRESSION FUSION CERVICAL FOUR-FIVE,CERVICAL FIVE-SIX,CERVICAL SIX-SEVEN. (N/A Spine Cervical)  Patient Location: PACU  Anesthesia Type:General  Level of Consciousness: oriented, drowsy and patient cooperative  Airway & Oxygen Therapy: Patient Spontanous Breathing and Patient connected to nasal cannula oxygen  Post-op Assessment: Report given to RN and Post -op Vital signs reviewed and stable  Post vital signs: Reviewed  Last Vitals:  Vitals Value Taken Time  BP 118/56 05/25/2018 10:25 AM  Temp 36.7 C 05/25/2018 10:26 AM  Pulse 67 05/25/2018 10:28 AM  Resp 16 05/25/2018 10:28 AM  SpO2 99 % 05/25/2018 10:28 AM  Vitals shown include unvalidated device data.  Last Pain:  Vitals:   05/25/18 1026  PainSc: Asleep         Complications: No apparent anesthesia complications

## 2018-05-25 NOTE — Progress Notes (Signed)
Orthopedic Tech Progress Note Patient Details:  Shanon PayorGarlena Ledford Wauters 04-04-1971 [id number]  Ortho Devices Type of Ortho Device: Soft collar Ortho Device/Splint Location: neck Ortho Device/Splint Interventions: Application   Post Interventions Patient Tolerated: Well Instructions Provided: Care of device   Nevaan Bunton 05/25/2018, 1:10 PM

## 2018-05-25 NOTE — Anesthesia Procedure Notes (Addendum)
Procedure Name: Intubation Date/Time: 05/25/2018 8:06 AM Performed by: Lovie Cholock, Aydan Phoenix K, CRNA Pre-anesthesia Checklist: Patient identified, Emergency Drugs available, Suction available and Patient being monitored Patient Re-evaluated:Patient Re-evaluated prior to induction Oxygen Delivery Method: Circle System Utilized Preoxygenation: Pre-oxygenation with 100% oxygen Induction Type: IV induction Ventilation: Mask ventilation without difficulty Laryngoscope Size: Glidescope and 3 Grade View: Grade I Tube type: Oral Tube size: 7.0 mm Number of attempts: 1 Airway Equipment and Method: Stylet and Video-laryngoscopy Placement Confirmation: ETT inserted through vocal cords under direct vision,  positive ETCO2 and breath sounds checked- equal and bilateral Secured at: 20 cm Tube secured with: Tape Dental Injury: Teeth and Oropharynx as per pre-operative assessment

## 2018-05-25 NOTE — Anesthesia Postprocedure Evaluation (Signed)
Anesthesia Post Note  Patient: Scientist, [name]  Procedure(s) Performed: ANTERIOR CERVICAL DECOMPRESSION FUSION CERVICAL FOUR-FIVE,CERVICAL FIVE-SIX,CERVICAL SIX-SEVEN. (N/A Spine Cervical)     Patient location during evaluation: PACU Anesthesia Type: General Level of consciousness: awake and alert Pain management: pain level controlled Vital Signs Assessment: post-procedure vital signs reviewed and stable Respiratory status: spontaneous breathing, nonlabored ventilation and respiratory function stable Cardiovascular status: blood pressure returned to baseline and stable Postop Assessment: no apparent nausea or vomiting Anesthetic complications: no    Last Vitals:  Vitals:   05/25/18 1125 05/25/18 1139  BP: (!) 115/58 118/64  Pulse: 64 67  Resp: 15 14  Temp: 36.5 C   SpO2: 100% 96%                 Beryle Lathehomas E Brock

## 2018-05-25 NOTE — H&P (Signed)
Subjective:   Patient is a 47 y.o. female admitted for acdf. The patient first presented to me with complaints of neck pain, shooting pains in the arm(s) and numbness of the arm(s). Onset of symptoms was several years ago. The pain is described as aching and occurs all day. The pain is rated severe, and is located in the neck and radiates to the arms. The symptoms have been progressive. Symptoms are exacerbated by bending chin toward chest and extending head backwards, and are relieved by none.  Previous work up includes MRI of cervical spine, results: spinal stenosis.  Past Medical History:  Diagnosis Date  . ADD (attention deficit disorder)   . Anemia    as child  . Anxiety   . Arthritis   . Asthma   . Asthma   . DDD (degenerative disc disease), cervical   . Depression   . Diverticulosis   . GERD (gastroesophageal reflux disease)   . History of snoring   . Hypertension   . IBS (irritable bowel syndrome)   . Migraines    had one 2 weeks ago  . Pneumonia    2014  . PONV (postoperative nausea and vomiting)   . Spondylolisthesis of lumbar region     Past Surgical History:  Procedure Laterality Date  . ABDOMINAL HYSTERECTOMY    . BACK SURGERY  08/2017   Lumbar surgery with Dr. Marikay Alaravid Zienna Ahlin  . CARPAL TUNNEL RELEASE Left   . CESAREAN SECTION    . CESAREAN SECTION     X 2  . COLONOSCOPY WITH ESOPHAGOGASTRODUODENOSCOPY (EGD)    . HAND SURGERY Left   . PARTIAL HYSTERECTOMY    . WRIST SURGERY Left    Cartlige bremoved    Allergies  Allergen Reactions  . Dilaudid [Hydromorphone Hcl] Anaphylaxis and Shortness Of Breath    Patient reports 09/21/17 she tolerates oxycodone fine  . Strawberry (Diagnostic) Anaphylaxis, Hives and Swelling  . Morphine And Related Rash and Other (See Comments)    "Burns me from the inside out"    Social History   Tobacco Use  . Smoking status: Never Smoker  . Smokeless tobacco: Never Used  Substance Use Topics  . Alcohol use: Not Currently   Comment: rare    History reviewed. No pertinent family history. Prior to Admission medications   Medication Sig Start Date End Date Taking? Authorizing Provider  buPROPion (WELLBUTRIN XL) 300 MG 24 hr tablet Take 300 mg by mouth daily.   Yes [provider]  losartan-hydrochlorothiazide (HYZAAR) 50-12.5 MG per tablet Take 1 tablet by mouth at bedtime.    Yes [provider]  meloxicam (MOBIC) 15 MG tablet Take 15 mg by mouth daily as needed for pain.   Yes [provider]  methocarbamol (ROBAXIN) 500 MG tablet Take 500 mg by mouth 3 (three) times daily as needed for spasms. 04/16/18  Yes [provider]  methylphenidate (RITALIN) 10 MG tablet Take 20 mg by mouth 2 (two) times daily.    Yes [provider]  mometasone (ELOCON) 0.1 % cream Apply 1 application topically daily as needed (excema).    Yes [provider]  ondansetron (ZOFRAN-ODT) 4 MG disintegrating tablet Take 4 mg by mouth every 8 (eight) hours as needed for nausea or vomiting.    Yes [provider]  oxyCODONE-acetaminophen (PERCOCET/ROXICET) 5-325 MG tablet Take 1 tablet by mouth every 6 (six) hours as needed (FOR PAIN.).   Yes [provider]  SUMAtriptan (IMITREX) 100 MG tablet Take  100 mg by mouth every 2 (two) hours as needed for migraine or headache.  07/21/17  Yes [provider]  albuterol (PROVENTIL HFA;VENTOLIN HFA) 108 (90 Base) MCG/ACT inhaler Inhale 2 puffs into the lungs every 4 (four) hours as needed for wheezing or shortness of breath.    [provider]     Review of Systems  Positive ROS: neg  All other systems have been reviewed and were otherwise negative with the exception of those mentioned in the HPI and as above.  Objective: Vital signs in last 24 hours: Temp:  [98.2 F (36.8 C)] 98.2 F (36.8 C) (06/27 0545) Pulse Rate:  [70] 70 (06/27 0545) Resp:  [18] 18 (06/27 0545) BP: (115)/(73) 115/73 (06/27 0545) SpO2:   [96 %] 96 % (06/27 0545) Weight:  [88.8 kg (195 lb 12.8 oz)] 88.8 kg (195 lb 12.8 oz) (06/27 0627)  General Appearance: Alert, cooperative, no distress, appears stated age Head: Normocephalic, without obvious abnormality, atraumatic Eyes: PERRL, conjunctiva/corneas clear, EOM's intact      Neck: Supple, symmetrical, trachea midline, Back: Symmetric, no curvature, ROM normal, no CVA tenderness Lungs:  respirations unlabored Heart: Regular rate and rhythm Abdomen: Soft, non-tender Extremities: Extremities normal, atraumatic, no cyanosis or edema Pulses: 2+ and symmetric all extremities Skin: Skin color, texture, turgor normal, no rashes or lesions  NEUROLOGIC:  Mental status: Alert and oriented x4, no aphasia, good attention span, fund of knowledge and memory  Motor Exam - grossly normal Sensory Exam - grossly normal Reflexes: 1+ Coordination - grossly normal Gait - grossly normal Balance - grossly normal Cranial Nerves: I: smell Not tested  II: visual acuity  OS: nl    OD: nl  II: visual fields Full to confrontation  II: pupils Equal, round, reactive to light  III,VII: ptosis None  III,IV,VI: extraocular muscles  Full ROM  V: mastication Normal  V: facial light touch sensation  Normal  V,VII: corneal reflex  Present  VII: facial muscle function - upper  Normal  VII: facial muscle function - lower Normal  VIII: hearing Not tested  IX: soft palate elevation  Normal  IX,X: gag reflex Present  XI: trapezius strength  5/5  XI: sternocleidomastoid strength 5/5  XI: neck flexion strength  5/5  XII: tongue strength  Normal    Data Review Lab Results  Component Value Date   WBC 9.2 05/17/2018   HGB 15.1 (H) 05/17/2018   HCT 46.9 (H) 05/17/2018   MCV 93.2 05/17/2018   PLT 321 05/17/2018   Lab Results  Component Value Date   NA 138 05/17/2018   K 3.3 (L) 05/17/2018   CL 97 (L) 05/17/2018   CO2 30 05/17/2018   BUN 17 05/17/2018   CREATININE 0.85 05/17/2018   GLUCOSE  85 05/17/2018   Lab Results  Component Value Date   INR 1.01 05/17/2018    Assessment:   Cervical neck pain with herniated nucleus pulposus/ spondylosis/ stenosis at C4-7. Estimated body mass index is 39.55 kg/m as calculated from the following:   Height as of this encounter: 4\' 11"  (1.499 m).   Weight as of this encounter: 88.8 kg (195 lb 12.8 oz).  Patient has failed conservative therapy. Planned surgery : ACDF C4-5 C5-6 C6-7  Plan:   I explained the condition and procedure to the patient and answered any questions.  Patient wishes to proceed with procedure as planned. Understands risks/ benefits/ and expected or typical outcomes.  Maddisen Vought S 05/25/2018 7:47 AM

## 2018-05-26 ENCOUNTER — Encounter (HOSPITAL_COMMUNITY): Payer: Self-pay | Admitting: Neurological Surgery

## 2018-05-26 MED ORDER — OXYCODONE-ACETAMINOPHEN 5-325 MG PO TABS: 1.0000 | ORAL_TABLET | Freq: Four times a day (QID) | ORAL | 0 refills | Status: DC | PRN

## 2018-05-26 MED ORDER — METHOCARBAMOL 500 MG PO TABS: 500.0000 mg | ORAL_TABLET | Freq: Three times a day (TID) | ORAL | 1 refills | Status: DC | PRN

## 2018-05-26 NOTE — Care Management Obs Status (Signed)
MEDICARE OBSERVATION STATUS NOTIFICATION   Patient Details  Name: Christina Potts MRN: [id number] Date of Birth: Nov 15, 1971   Medicare Observation Status Notification Given:  Other (see comment)([facility])    Durenda GuthrieBrady, [place] Hert Naomi, RN 05/26/2018, 12:43 PM

## 2018-05-26 NOTE — Discharge Summary (Signed)
Physician Discharge Summary  Patient ID: Christina Potts MRN: [id number] DOB/AGE: 1970-12-06 47 y.o.  Admit date: 05/25/2018 Discharge date: 05/26/2018  Admission Diagnoses: cervical spondylosis    Discharge Diagnoses: same   Discharged Condition: good  Hospital Course: The patient was admitted on 05/25/2018 and taken to the operating room where the patient underwent ACDF C4-5 C5-6 C6-7. The patient tolerated the procedure well and was taken to the recovery room and then to the floor in stable condition. The hospital course was routine. There were no complications. The wound remained clean dry and intact. Pt had appropriate neck soreness. No complaints of arm pain or new N/T/W. The patient remained afebrile with stable vital signs, and tolerated a regular diet. The patient continued to increase activities, and pain was well controlled with oral pain medications.   Consults: None  Significant Diagnostic Studies:  Results for orders placed or performed during the hospital encounter of 05/17/18  Surgical pcr screen  Result Value Ref Range   MRSA, PCR NEGATIVE NEGATIVE   Staphylococcus aureus NEGATIVE NEGATIVE  Basic metabolic panel  Result Value Ref Range   Sodium 138 135 - 145 mmol/L   Potassium 3.3 (L) 3.5 - 5.1 mmol/L   Chloride 97 (L) 101 - 111 mmol/L   CO2 30 22 - 32 mmol/L   Glucose, Bld 85 65 - 99 mg/dL   BUN 17 6 - 20 mg/dL   Creatinine, Ser 8.65 0.44 - 1.00 mg/dL   Calcium 9.0 8.9 - 78.4 mg/dL   GFR calc non Af Amer >60 >60 mL/min   GFR calc Af Amer >60 >60 mL/min   Anion gap 11 5 - 15  CBC WITH DIFFERENTIAL  Result Value Ref Range   WBC 9.2 4.0 - 10.5 K/uL   RBC 5.03 3.87 - 5.11 MIL/uL   Hemoglobin 15.1 (H) 12.0 - 15.0 g/dL   HCT 69.6 (H) 29.5 - 28.4 %   MCV 93.2 78.0 - 100.0 fL   MCH 30.0 26.0 - 34.0 pg   MCHC 32.2 30.0 - 36.0 g/dL   RDW 13.2 44.0 - 10.2 %   Platelets 321 150 - 400 K/uL   Neutrophils Relative % 72 %   Neutro Abs 6.5 1.7 - 7.7 K/uL    Lymphocytes Relative 20 %   Lymphs Abs 1.9 0.7 - 4.0 K/uL   Monocytes Relative 7 %   Monocytes Absolute 0.7 0.1 - 1.0 K/uL   Eosinophils Relative 1 %   Eosinophils Absolute 0.1 0.0 - 0.7 K/uL   Basophils Relative 0 %   Basophils Absolute 0.0 0.0 - 0.1 K/uL   Immature Granulocytes 0 %   Abs Immature Granulocytes 0.0 0.0 - 0.1 K/uL  Protime-INR  Result Value Ref Range   Prothrombin Time 13.2 11.4 - 15.2 seconds   INR 1.01   Type and screen All Cardiac and thoracic surgeries, spinal fusions, myomectomies, craniotomies, colon & liver resections, total joint revisions, same day c-section with placenta previa or accreta.  Result Value Ref Range   ABO/RH(D) O POS    Antibody Screen NEG    Sample Expiration 05/31/2018    Extend sample reason      NO TRANSFUSIONS OR PREGNANCY IN THE PAST 3 MONTHS Performed at [facility], [address], [place], [place] [address]     Chest 2 View  Result Date: 05/17/2018 CLINICAL DATA:  Preoperative exam prior cervical spine surgery. No current chest complaints. History of asthma. Nonsmoker. EXAM: CHEST - 2 VIEW COMPARISON:  None in PACs FINDINGS: The lungs are adequately inflated. There is no focal infiltrate. There is no pleural effusion. The heart and pulmonary vascularity are normal. The mediastinum is normal in width. The bony thorax exhibits no acute abnormality. IMPRESSION: There is no active cardiopulmonary disease. Electronically Signed   By: Jamile Sivils  [place] M.D.   On: 05/17/2018 17:22   Dg Cervical Spine 2-3 Views  Result Date: 05/25/2018 CLINICAL DATA:  Cervical fusion EXAM: CERVICAL SPINE - 2-3 VIEW; DG C-ARM 61-120 MIN COMPARISON:  None. FLUOROSCOPY TIME:  Fluoroscopy Time:  20 seconds Radiation Exposure Index (if provided by the fluoroscopic device): Not available Number of Acquired Spot Images: 3 FINDINGS: Cervical fusion is noted from C4-C7 with anterior fixation. No acute abnormality is noted. IMPRESSION: Cervical fusion. Electronically  Signed   By: Alcide CleverMark  Lukens M.D.   On: 05/25/2018 10:28   Dg C-arm 1-60 Min  Result Date: 05/25/2018 CLINICAL DATA:  Cervical fusion EXAM: CERVICAL SPINE - 2-3 VIEW; DG C-ARM 61-120 MIN COMPARISON:  None. FLUOROSCOPY TIME:  Fluoroscopy Time:  20 seconds Radiation Exposure Index (if provided by the fluoroscopic device): Not available Number of Acquired Spot Images: 3 FINDINGS: Cervical fusion is noted from C4-C7 with anterior fixation. No acute abnormality is noted. IMPRESSION: Cervical fusion. Electronically Signed   By: Alcide CleverMark  Lukens M.D.   On: 05/25/2018 10:28    Antibiotics:  Anti-infectives (From admission, onward)   Start     Dose/Rate Route Frequency Ordered Stop   05/25/18 1600  ceFAZolin (ANCEF) IVPB 2g/100 mL premix     2 g 200 mL/hr over 30 Minutes Intravenous Every 8 hours 05/25/18 1255 05/25/18 2333   05/25/18 0853  bacitracin 50,000 Units in sodium chloride 0.9 % 500 mL irrigation  Status:  Discontinued       As needed 05/25/18 0854 05/25/18 1019   05/25/18 0630  ceFAZolin (ANCEF) IVPB 2g/100 mL premix     2 g 200 mL/hr over 30 Minutes Intravenous On call to O.R. 05/25/18 [id number] 05/25/18 0834   05/25/18 0624  ceFAZolin (ANCEF) 2-4 GM/100ML-% IVPB    Note to Pharmacy:  Clovis CaoFerguson, Laura   : cabinet override      05/25/18 0624 05/25/18 0819      Discharge Exam: Blood pressure (!) 111/54, pulse 72, temperature 98 F (36.7 C), temperature source Oral, resp. rate 20, height 4\' 11"  (1.499 m), weight 88.8 kg (195 lb 12.8 oz), SpO2 98 %. Neurologic: Grossly normal Dressing dry  Discharge Medications:   Allergies as of 05/26/2018      Reactions   Dilaudid [hydromorphone Hcl] Anaphylaxis, Shortness Of Breath   Patient reports 09/21/17 she tolerates oxycodone fine   Strawberry (diagnostic) Anaphylaxis, Hives, Swelling   Morphine And Related Rash, Other (See Comments)   "Burns me from the inside out"      Medication List    STOP taking these medications   meloxicam 15 MG  tablet Commonly known as:  MOBIC     TAKE these medications   albuterol 108 (90 Base) MCG/ACT inhaler Commonly known as:  PROVENTIL HFA;VENTOLIN HFA Inhale 2 puffs into the lungs every 4 (four) hours as needed for wheezing or shortness of breath.   buPROPion 300 MG 24 hr tablet Commonly known as:  WELLBUTRIN XL Take 300 mg by mouth daily.   losartan-hydrochlorothiazide 50-12.5 MG tablet Commonly known as:  HYZAAR Take 1 tablet by mouth at bedtime.   methocarbamol 500 MG tablet Commonly known as:  ROBAXIN Take 1 tablet (500 mg  total) by mouth 3 (three) times daily as needed. What changed:  reasons to take this   methylphenidate 10 MG tablet Commonly known as:  RITALIN Take 20 mg by mouth 2 (two) times daily.   mometasone 0.1 % cream Commonly known as:  ELOCON Apply 1 application topically daily as needed (excema).   ondansetron 4 MG disintegrating tablet Commonly known as:  ZOFRAN-ODT Take 4 mg by mouth every 8 (eight) hours as needed for nausea or vomiting.   oxyCODONE-acetaminophen 5-325 MG tablet Commonly known as:  PERCOCET/ROXICET Take 1-2 tablets by mouth every 6 (six) hours as needed (FOR PAIN.). What changed:  how much to take   SUMAtriptan 100 MG tablet Commonly known as:  IMITREX Take 100 mg by mouth every 2 (two) hours as needed for migraine or headache.       Disposition: home   Final Dx: ACDF C4-5 C5-6 C6-7  Discharge Instructions     Remove dressing in 72 hours   Complete by:  As directed    Call MD for:  difficulty breathing, headache or visual disturbances   Complete by:  As directed    Call MD for:  persistant nausea and vomiting   Complete by:  As directed    Call MD for:  redness, tenderness, or signs of infection (pain, swelling, redness, odor or green/yellow discharge around incision site)   Complete by:  As directed    Call MD for:  severe uncontrolled pain   Complete by:  As directed    Call MD for:  temperature >100.4   Complete by:   As directed    Diet - low sodium heart healthy   Complete by:  As directed    Increase activity slowly   Complete by:  As directed          Signed: Stark Aguinaga S 05/26/2018, 7:33 AM

## 2018-05-26 NOTE — Progress Notes (Signed)
Pt doing well. Pt and family given D/C instructions with Rx's, verbal understanding was provided. Pt's incision is clean and dry with no sign of infection. Pt's IV was removed prior to D/C. Pt D/C'd home via wheelchair per MD order. Pt is stable @ D/C and has no other needs at this time. Katye Valek, RN  

## 2018-07-05 ENCOUNTER — Encounter: Payer: Self-pay | Admitting: Neurology

## 2018-07-08 ENCOUNTER — Other Ambulatory Visit: Payer: Self-pay | Admitting: Neurological Surgery

## 2018-07-08 DIAGNOSIS — M5416 Radiculopathy, lumbar region: Secondary | ICD-10-CM

## 2018-07-19 ENCOUNTER — Ambulatory Visit
Admission: RE | Admit: 2018-07-19 | Discharge: 2018-07-19 | Disposition: A | Discharge status: Home / Self Care | Payer: 59 | Source: Ambulatory Visit | Attending: Neurological Surgery | Admitting: Neurological Surgery

## 2018-07-19 DIAGNOSIS — M5416 Radiculopathy, lumbar region: Secondary | ICD-10-CM

## 2018-07-25 ENCOUNTER — Ambulatory Visit (INDEPENDENT_AMBULATORY_CARE_PROVIDER_SITE_OTHER): Payer: 59 | Admitting: Neurology

## 2018-07-25 DIAGNOSIS — R202 Paresthesia of skin: Secondary | ICD-10-CM | POA: Diagnosis not present

## 2018-07-25 DIAGNOSIS — G5603 Carpal tunnel syndrome, bilateral upper limbs: Secondary | ICD-10-CM

## 2018-07-25 NOTE — Procedures (Signed)
[facility]  [address], [address]  [place], [place] [address] Tel: [phone] Fax:  [phone] Test Date:  07/25/2018  Patient: Christina HerterGarlena Potts DOB: 1971/05/06 Physician: Nita Sickleonika Krisha Beegle, DO  Sex: Female Height: 4\' 11"  Ref Phys: Marikay Alaravid Jones, MD  ID#: [id number] Temp: 34.6C Technician:    Patient Complaints: This is a 47 year old female with static cervical spondylosis s/p ACFD C4-5 C5-6 and C6-7 referred for evaluation of bilateral hand paresthesias.  NCV & EMG Findings: Extensive electrodiagnostic testing of the right upper extremity and additional studies of the left shows:  1. Right median sensory response shows prolonged distal peak latency (4.9 ms) and reduced amplitude (8.1 V). Left mixed palmar sensory responses show prolonged latency. Bilateral ulnar sensory responses are within normal limits. 2. Right median motor response shows prolonged distal onset latency (5.5 ms) and reduced amplitude (4.3 mV).  Left median and bilateral ulnar motor responses are within normal limits.  3. Chronic motor axon loss changes are seen affecting the abductor pollicis brevis, biceps, and deltoid muscles on the right. These findings are not present in the left upper extremity.   Impression: 1. Right median neuropathy at or distal to the wrist, consistent with the clinical diagnosis of carpal tunnel syndrome. Overall, these findings are severe in degree electrically. 2. Left median neuropathy at or distal to the wrist, consistent with the clinical diagnosis of carpal tunnel syndrome, very mild in degree electrically. 3. Chronic C6 radiculopathy affecting the right upper extremity, mild in degree electrically.    ___________________________ Nita Sickleonika Shonique Pelphrey, DO    Nerve Conduction Studies Anti Sensory Summary Table   Site NR Peak (ms) Norm Peak (ms) P-T Amp (V) Norm P-T Amp  Left Median Anti Sensory (2nd Digit)  34.6C  Wrist    2.9 <3.4 61.0 >20  Right Median Anti Sensory (2nd  Digit)  34.6C  Wrist    4.9 <3.4 8.1 >20  Left Ulnar Anti Sensory (5th Digit)  34.6C  Wrist    2.4 <3.1 51.0 >12  Right Ulnar Anti Sensory (5th Digit)  34.6C  Wrist    2.2 <3.1 55.8 >12   Motor Summary Table   Site NR Onset (ms) Norm Onset (ms) O-P Amp (mV) Norm O-P Amp Site1 Site2 Delta-0 (ms) Dist (cm) Vel (m/s) Norm Vel (m/s)  Left Median Motor (Abd Poll Brev)  34.6C  Wrist    3.4 <3.9 8.8 >6 Elbow Wrist 4.3 26.0 60 >50  Elbow    7.7  8.0         Right Median Motor (Abd Poll Brev)  34.6C  Wrist    5.5 <3.9 4.3 >6 Elbow Wrist 5.0 28.0 56 >50  Elbow    10.5  4.0         Left Ulnar Motor (Abd Dig Minimi)  34.6C  Wrist    2.5 <3.1 8.3 >7 B Elbow Wrist 3.2 21.0 66 >50  B Elbow    5.7  7.7  A Elbow B Elbow 1.6 10.0 63 >50  A Elbow    7.3  7.5         Right Ulnar Motor (Abd Dig Minimi)  34.6C  Wrist    2.1 <3.1 8.3 >7 B Elbow Wrist 3.1 21.0 68 >50  B Elbow    5.2  8.0  A Elbow B Elbow 1.5 10.0 67 >50  A Elbow    6.7  7.5          Comparison Summary Table   Site NR  Peak (ms) Norm Peak (ms) P-T Amp (V) Site1 Site2 Delta-P (ms) Norm Delta (ms)  Left Median/Ulnar Palm Comparison (Wrist - 8cm)  34.6C  Median Palm    1.7 <2.2 132.3 Median Palm Ulnar Palm 0.5   Ulnar Palm    1.2 <2.2 41.0       EMG   Side Muscle Ins Act Fibs Psw Fasc Number Recrt Dur Dur. Amp Amp. Poly Poly. Comment  Right 1stDorInt Nml Nml Nml Nml Nml Nml Nml Nml Nml Nml Nml Nml N/A  Right Abd Poll Brev Nml Nml Nml Nml 1- Rapid Some 1+ Some 1+ Nml Nml N/A  Right PronatorTeres Nml Nml Nml Nml Nml Nml Nml Nml Nml Nml Nml Nml N/A  Right Biceps Nml Nml Nml Nml 1- Rapid Few 1+ Few 1+ Nml Nml N/A  Right Triceps Nml Nml Nml Nml Nml Nml Nml Nml Nml Nml Nml Nml N/A  Right Deltoid Nml Nml Nml Nml 1- Rapid Some 1+ Few 1+ Nml Nml N/A  Left Deltoid Nml Nml Nml Nml Nml Nml Nml Nml Nml Nml Nml Nml N/A  Left 1stDorInt Nml Nml Nml Nml Nml Nml Nml Nml Nml Nml Nml Nml N/A  Left Abd Poll Brev Nml Nml Nml Nml Nml Nml Nml Nml Nml  Nml Nml Nml N/A  Left PronatorTeres Nml Nml Nml Nml Nml Nml Nml Nml Nml Nml Nml Nml N/A  Left Biceps Nml Nml Nml Nml Nml Nml Nml Nml Nml Nml Nml Nml N/A  Left Triceps Nml Nml Nml Nml Nml Nml Nml Nml Nml Nml Nml Nml N/A      Waveforms:

## 2018-07-26 ENCOUNTER — Other Ambulatory Visit (HOSPITAL_COMMUNITY)
Admission: RE | Admit: 2018-07-26 | Discharge: 2018-07-26 | Disposition: A | Discharge status: Home / Self Care | Payer: 59 | Source: Ambulatory Visit | Attending: Obstetrics and Gynecology | Admitting: Obstetrics and Gynecology

## 2018-07-26 ENCOUNTER — Encounter: Payer: Self-pay | Admitting: Obstetrics and Gynecology

## 2018-07-26 ENCOUNTER — Ambulatory Visit (INDEPENDENT_AMBULATORY_CARE_PROVIDER_SITE_OTHER): Payer: 59 | Admitting: Obstetrics and Gynecology

## 2018-07-26 VITALS — BP 118/90 | HR 94 | Ht 59.0 in | Wt 193.0 lb

## 2018-07-26 DIAGNOSIS — Z1151 Encounter for screening for human papillomavirus (HPV): Secondary | ICD-10-CM | POA: Insufficient documentation

## 2018-07-26 DIAGNOSIS — Z1231 Encounter for screening mammogram for malignant neoplasm of breast: Secondary | ICD-10-CM

## 2018-07-26 DIAGNOSIS — Z113 Encounter for screening for infections with a predominantly sexual mode of transmission: Secondary | ICD-10-CM | POA: Diagnosis present

## 2018-07-26 DIAGNOSIS — N951 Menopausal and female climacteric states: Secondary | ICD-10-CM

## 2018-07-26 DIAGNOSIS — Z124 Encounter for screening for malignant neoplasm of cervix: Secondary | ICD-10-CM

## 2018-07-26 DIAGNOSIS — E876 Hypokalemia: Secondary | ICD-10-CM

## 2018-07-26 DIAGNOSIS — Z01411 Encounter for gynecological examination (general) (routine) with abnormal findings: Secondary | ICD-10-CM

## 2018-07-26 DIAGNOSIS — Z1321 Encounter for screening for nutritional disorder: Secondary | ICD-10-CM

## 2018-07-26 DIAGNOSIS — Z803 Family history of malignant neoplasm of breast: Secondary | ICD-10-CM

## 2018-07-26 DIAGNOSIS — Z1329 Encounter for screening for other suspected endocrine disorder: Secondary | ICD-10-CM

## 2018-07-26 DIAGNOSIS — Z01419 Encounter for gynecological examination (general) (routine) without abnormal findings: Secondary | ICD-10-CM

## 2018-07-26 DIAGNOSIS — Z0000 Encounter for general adult medical examination without abnormal findings: Secondary | ICD-10-CM

## 2018-07-26 DIAGNOSIS — Z1239 Encounter for other screening for malignant neoplasm of breast: Secondary | ICD-10-CM

## 2018-07-26 DIAGNOSIS — L659 Nonscarring hair loss, unspecified: Secondary | ICD-10-CM

## 2018-07-26 DIAGNOSIS — Z131 Encounter for screening for diabetes mellitus: Secondary | ICD-10-CM

## 2018-07-26 NOTE — Patient Instructions (Addendum)
I value your feedback and entrusting us with your care. If you get a [facility] patient survey, I would appreciate you taking the time to let us know about your experience today. Thank you!  [facility]: [phone]  [facility]: [phone]  

## 2018-07-26 NOTE — Progress Notes (Signed)
PCP:  Lynnell Jude, MD   Chief Complaint  Patient presents with  . Gynecologic Exam     HPI:      [name] is a 47 y.o. No obstetric history on file. who LMP was No LMP recorded. Patient has had a hysterectomy., presents today for her annual examination.  Her menses are absent due to hyst for leio/DUB. Has 1 ovary. Has vasomotor sx.  Dysmenorrhea none. She does not have intermenstrual bleeding.  Sex activity: not sexually active.  Last Pap: February 01, 2013  Results were: no abnormalities /neg HPV DNA   Last mammogram: a couple yrs ago. There is a strong FH of breast cancer in pat cousins/pat aunt. There is a FH of ovarian cancer in a pat cousin. A cousin was BRCA neg but several deceased. Pt is BRCA/colaris neg 2014; no update testing done. The patient does not do self-breast exams.  Tobacco use: The patient denies current or previous tobacco use. Alcohol use: none No drug use.  Exercise: moderately active  She does get adequate calcium but not Vitamin D in her diet. Would like labs done. FH hypothyroidism and pt has noted increased thinning hair/hair loss. Also has vasomotor sx, sometimes worse than others. Had hypokalemia at 6/19 surgery.   Past Medical History:  Diagnosis Date  . ADD (attention deficit disorder)   . Anemia    as child  . Anxiety   . Arthritis   . Asthma   . BRCA negative 01/2013   BRCA/Colaris neg; no update testing done  . DDD (degenerative disc disease), cervical   . Depression   . Diverticulosis   . GERD (gastroesophageal reflux disease)   . History of snoring   . Hypertension   . IBS (irritable bowel syndrome)   . Migraines    had one 2 weeks ago  . Pneumonia    2014  . PONV (postoperative nausea and vomiting)   . Spondylolisthesis of lumbar region     Past Surgical History:  Procedure Laterality Date  . ABDOMINAL HYSTERECTOMY  11/08/2000   TAH RSO, LOA due to pelvic pain/menometrorrhagia. Dr. Ammie Dalton  . ANTERIOR  CERVICAL DECOMP/DISCECTOMY FUSION N/A 05/25/2018   Procedure: ANTERIOR CERVICAL DECOMPRESSION FUSION CERVICAL FOUR-FIVE,CERVICAL FIVE-SIX,CERVICAL SIX-SEVEN.;  Surgeon: Eustace Moore, MD;  Location: [place];  Service: Neurosurgery;  Laterality: N/A;  right side approach  . BACK SURGERY  08/2017   Lumbar surgery with Dr. Sherley Bounds  . CARPAL TUNNEL RELEASE Left   . CESAREAN SECTION    . CESAREAN SECTION     X 2  . COLONOSCOPY WITH ESOPHAGOGASTRODUODENOSCOPY (EGD)    . HAND SURGERY Left   . PARTIAL HYSTERECTOMY    . WRIST SURGERY Left    Cartlige bremoved    Family History  Problem Relation Age of Onset  . Lung cancer Maternal Aunt        19s  . Breast cancer Paternal Aunt   . Prostate cancer Paternal Uncle   . Liver cancer Maternal Grandmother   . Ovarian cancer Other   . Breast cancer Other        5 cousins, 66-50 yrs old  . Ovarian cancer Cousin   . Breast cancer Cousin   . Brain cancer Cousin   . Stomach cancer Cousin     Social History   Socioeconomic History  . Marital status: Single    Spouse name: Not on file  . Number of children: Not on file  .  Years of education: Not on file  . Highest education level: Not on file  Occupational History  . Not on file  Social Needs  . Financial resource strain: Not on file  . Food insecurity:    Worry: Not on file    Inability: Not on file  . Transportation needs:    Medical: Not on file    Non-medical: Not on file  Tobacco Use  . Smoking status: Never Smoker  . Smokeless tobacco: Never Used  Substance and Sexual Activity  . Alcohol use: Not Currently    Comment: rare  . Drug use: No  . Sexual activity: Not Currently    Birth control/protection: Surgical    Comment: Hysterectomy  Lifestyle  . Physical activity:    Days per week: Not on file    Minutes per session: Not on file  . Stress: Not on file  Relationships  . Social connections:    Talks on phone: Not on file    Gets together: Not on file    Attends  religious service: Not on file    Active member of club or organization: Not on file    Attends meetings of clubs or organizations: Not on file    Relationship status: Not on file  . Intimate partner violence:    Fear of current or ex partner: Not on file    Emotionally abused: Not on file    Physically abused: Not on file    Forced sexual activity: Not on file  Other Topics Concern  . Not on file  Social History Narrative   ** Merged History Encounter **        Outpatient Medications Prior to Visit  Medication Sig Dispense Refill  . buPROPion (WELLBUTRIN XL) 300 MG 24 hr tablet Take 300 mg by mouth daily.    . diazepam (VALIUM) 5 MG tablet TK 1 TO 2 TS PO ONE HOUR B MRI  0  . losartan-hydrochlorothiazide (HYZAAR) 50-12.5 MG per tablet Take 1 tablet by mouth at bedtime.     . methocarbamol (ROBAXIN) 500 MG tablet Take 1 tablet (500 mg total) by mouth 3 (three) times daily as needed. 60 tablet 1  . methylphenidate (RITALIN) 10 MG tablet Take 20 mg by mouth 2 (two) times daily.     . mometasone (ELOCON) 0.1 % cream Apply 1 application topically daily as needed (excema).     . ondansetron (ZOFRAN-ODT) 4 MG disintegrating tablet Take 4 mg by mouth every 8 (eight) hours as needed for nausea or vomiting.     Marland Kitchen oxyCODONE-acetaminophen (PERCOCET/ROXICET) 5-325 MG tablet Take 1-2 tablets by mouth every 6 (six) hours as needed (FOR PAIN.). 50 tablet 0  . SUMAtriptan (IMITREX) 100 MG tablet Take 100 mg by mouth every 2 (two) hours as needed for migraine or headache.   2  . albuterol (PROVENTIL HFA;VENTOLIN HFA) 108 (90 Base) MCG/ACT inhaler Inhale 2 puffs into the lungs every 4 (four) hours as needed for wheezing or shortness of breath.     No facility-administered medications prior to visit.       ROS:  Review of Systems  Constitutional: Positive for fatigue. Negative for fever and unexpected weight change.  Respiratory: Negative for cough, shortness of breath and wheezing.     Cardiovascular: Negative for chest pain, palpitations and leg swelling.  Gastrointestinal: Positive for constipation and nausea. Negative for blood in stool, diarrhea and vomiting.  Endocrine: Negative for cold intolerance, heat intolerance and polyuria.  Genitourinary: Positive for  frequency. Negative for dyspareunia, dysuria, flank pain, genital sores, hematuria, menstrual problem, pelvic pain, urgency, vaginal bleeding, vaginal discharge and vaginal pain.  Musculoskeletal: Positive for arthralgias and back pain. Negative for joint swelling and myalgias.  Skin: Negative for rash.  Neurological: Positive for numbness and headaches. Negative for dizziness, syncope and light-headedness.  Hematological: Negative for adenopathy.  Psychiatric/Behavioral: Positive for agitation and dysphoric mood. Negative for confusion, sleep disturbance and suicidal ideas. The patient is not nervous/anxious.    BREAST: No symptoms   Objective: BP 118/90   Pulse 94   Ht '4\' 11"'  (1.499 m)   Wt 193 lb (87.5 kg)   BMI 38.98 kg/m    Physical Exam  Constitutional: She is oriented to person, place, and time. She appears well-developed and well-nourished.  Genitourinary: Vagina normal. There is no rash or tenderness on the right labia. There is no rash or tenderness on the left labia. No erythema or tenderness in the vagina. No vaginal discharge found. Right adnexum does not display mass and does not display tenderness. Left adnexum does not display mass and does not display tenderness.  Genitourinary Comments: UTERUS/CX SURG REM  Neck: Normal range of motion. No thyromegaly present.  Cardiovascular: Normal rate, regular rhythm and normal heart sounds.  No murmur heard. Pulmonary/Chest: Effort normal and breath sounds normal. Right breast exhibits no mass, no nipple discharge, no skin change and no tenderness. Left breast exhibits no mass, no nipple discharge, no skin change and no tenderness.  Abdominal: Soft.  There is no tenderness. There is no guarding.  Musculoskeletal: Normal range of motion.  Neurological: She is alert and oriented to person, place, and time. No cranial nerve deficit.  Psychiatric: She has a normal mood and affect. Her behavior is normal.  Vitals reviewed.   Assessment/Plan: Encounter for annual routine gynecological examination  Cervical cancer screening - Plan: Cytology - PAP, CANCELED: Cytology - PAP  Screening for HPV (human papillomavirus) - Plan: Cytology - PAP, CANCELED: Cytology - PAP  Screening for breast cancer - Pt to sched mammo.  - Plan: MM 3D SCREEN BREAST BILATERAL  Family history of breast cancer - MyRisk update testing done today. Will call with results. If gene neg, may be at increased risk of breast cancer needing addl screening. - Plan: MM 3D SCREEN BREAST BILATERAL  Thyroid disorder screening - Plan: TSH + free T4  Screening for diabetes mellitus - Plan: Hemoglobin A1c  Encounter for vitamin deficiency screening - Plan: VITAMIN D 25 Hydroxy (Vit-D Deficiency, Fractures)  Hypokalemia - Plan: Comprehensive metabolic panel  Hair loss  Perimenopausal vasomotor symptoms  Blood tests for routine general physical examination - Plan: Comprehensive metabolic panel, TSH + free T4, VITAMIN D 25 Hydroxy (Vit-D Deficiency, Fractures), Hemoglobin A1c           GYN counsel breast self exam, mammography screening, adequate intake of calcium and vitamin D, diet and exercise     F/U  Return in about 1 year (around 07/27/2019).  Alicia B. Copland, PA-C 07/26/2018 3:04 PM

## 2018-07-27 LAB — COMPREHENSIVE METABOLIC PANEL
ALK PHOS: 78 IU/L (ref 39–117)
ALT: 19 IU/L (ref 0–32)
AST: 17 IU/L (ref 0–40)
Albumin/Globulin Ratio: 1.7 (ref 1.2–2.2)
Albumin: 4 g/dL (ref 3.5–5.5)
BUN/Creatinine Ratio: 7 — ABNORMAL LOW (ref 9–23)
BUN: 6 mg/dL (ref 6–24)
Bilirubin Total: 0.2 mg/dL (ref 0.0–1.2)
CO2: 24 mmol/L (ref 20–29)
CREATININE: 0.86 mg/dL (ref 0.57–1.00)
Calcium: 9.1 mg/dL (ref 8.7–10.2)
Chloride: 101 mmol/L (ref 96–106)
GFR calc non Af Amer: 82 mL/min/{1.73_m2} (ref >59)
GFR, EST AFRICAN AMERICAN: 94 mL/min/{1.73_m2} (ref >59)
GLOBULIN, TOTAL: 2.4 g/dL (ref 1.5–4.5)
GLUCOSE: 92 mg/dL (ref 65–99)
Potassium: 4.1 mmol/L (ref 3.5–5.2)
SODIUM: 142 mmol/L (ref 134–144)
TOTAL PROTEIN: 6.4 g/dL (ref 6.0–8.5)

## 2018-07-27 LAB — VITAMIN D 25 HYDROXY (VIT D DEFICIENCY, FRACTURES): Vit D, 25-Hydroxy: 31.2 ng/mL (ref 30.0–100.0)

## 2018-07-27 LAB — HEMOGLOBIN A1C
ESTIMATED AVERAGE GLUCOSE: 108 mg/dL
HEMOGLOBIN A1C: 5.4 % (ref 4.8–5.6)

## 2018-07-27 LAB — TSH+FREE T4
Free T4: 1.29 ng/dL (ref 0.82–1.77)
TSH: 1.38 u[IU]/mL (ref 0.450–4.500)

## 2018-07-30 DIAGNOSIS — Z803 Family history of malignant neoplasm of breast: Secondary | ICD-10-CM

## 2018-07-30 HISTORY — DX: Family history of malignant neoplasm of breast: Z80.3

## 2018-08-01 LAB — CYTOLOGY - PAP
Diagnosis: NEGATIVE
HPV: NOT DETECTED

## 2018-08-07 ENCOUNTER — Encounter: Payer: Self-pay | Admitting: Obstetrics and Gynecology

## 2018-08-08 ENCOUNTER — Ambulatory Visit
Admission: RE | Admit: 2018-08-08 | Discharge: 2018-08-08 | Disposition: A | Discharge status: Home / Self Care | Payer: 59 | Source: Ambulatory Visit | Attending: Obstetrics and Gynecology | Admitting: Obstetrics and Gynecology

## 2018-08-08 DIAGNOSIS — Z803 Family history of malignant neoplasm of breast: Secondary | ICD-10-CM | POA: Insufficient documentation

## 2018-08-08 DIAGNOSIS — Z1231 Encounter for screening mammogram for malignant neoplasm of breast: Secondary | ICD-10-CM | POA: Diagnosis present

## 2018-08-08 DIAGNOSIS — Z1239 Encounter for other screening for malignant neoplasm of breast: Secondary | ICD-10-CM

## 2018-08-14 ENCOUNTER — Encounter: Payer: Self-pay | Admitting: Obstetrics and Gynecology

## 2018-08-14 ENCOUNTER — Other Ambulatory Visit: Payer: Self-pay | Admitting: *Deleted

## 2018-08-14 ENCOUNTER — Inpatient Hospital Stay
Admission: RE | Admit: 2018-08-14 | Discharge: 2018-08-14 | Disposition: A | Discharge status: Home / Self Care | Payer: Self-pay | Source: Ambulatory Visit | Attending: *Deleted | Admitting: *Deleted

## 2018-08-14 DIAGNOSIS — Z9289 Personal history of other medical treatment: Secondary | ICD-10-CM

## 2018-08-17 ENCOUNTER — Telehealth: Payer: Self-pay | Admitting: Obstetrics and Gynecology

## 2018-08-17 NOTE — Telephone Encounter (Signed)
Pt aware of neg MyRisk results. IBIS=12.4%. Pt current on mammo. No increased screening recommended.   Patient understands these results only apply to her and her children, and this is not indicative of genetic testing results of her other family members. It is recommended that her other family members have genetic testing done.  Pt also understands negative genetic testing doesn't mean she will never get any of these cancers.   Hard copy mailed to pt. F/u prn.

## 2018-08-25 ENCOUNTER — Encounter: Payer: Self-pay | Admitting: Gastroenterology

## 2018-08-28 ENCOUNTER — Encounter: Payer: Self-pay | Admitting: Gastroenterology

## 2018-08-31 ENCOUNTER — Encounter: Payer: Self-pay | Admitting: Gastroenterology

## 2018-09-01 ENCOUNTER — Ambulatory Visit (INDEPENDENT_AMBULATORY_CARE_PROVIDER_SITE_OTHER): Payer: 59 | Admitting: Gastroenterology

## 2018-09-01 ENCOUNTER — Encounter: Payer: Self-pay | Admitting: Gastroenterology

## 2018-09-01 VITALS — BP 136/88 | HR 88 | Ht 59.0 in | Wt 188.4 lb

## 2018-09-01 DIAGNOSIS — Z86010 Personal history of colonic polyps: Secondary | ICD-10-CM

## 2018-09-01 DIAGNOSIS — K581 Irritable bowel syndrome with constipation: Secondary | ICD-10-CM | POA: Diagnosis not present

## 2018-09-01 DIAGNOSIS — R1013 Epigastric pain: Secondary | ICD-10-CM

## 2018-09-01 DIAGNOSIS — K625 Hemorrhage of anus and rectum: Secondary | ICD-10-CM | POA: Diagnosis not present

## 2018-09-01 MED ORDER — DICYCLOMINE HCL 10 MG PO CAPS: 10.0000 mg | ORAL_CAPSULE | Freq: Two times a day (BID) | ORAL | 3 refills | Status: DC | PRN

## 2018-09-01 MED ORDER — DEXLANSOPRAZOLE 60 MG PO CPDR: 60.0000 mg | DELAYED_RELEASE_CAPSULE | Freq: Every day | ORAL | 0 refills | Status: DC

## 2018-09-01 NOTE — Progress Notes (Signed)
Chief Complaint:   Referring Provider:  Lynnell Jude, MD      ASSESSMENT AND PLAN;   #1. IBS with constipation (with alt diarrhea)- occ percocet after ACD (anterior cervical decompression) sx June 2019 #2. Rectal bleeding. Hb normal 04/2018. #3. GERD with epigastric pain. #4. History of colonic polyps and diverticulosis. Colon 01/2008: Sigmoid diverticulosis, otherwise normal to TI.  Negative random colonic and TI Bx.  Plan: - Increase water intake. - Minimize pain medications including nonsteroidals. - If still with problems, will give a trial of Amitiza, Linzess or Trulance.  - Bentyl 33m po bid when she has diarrhea or abdominal pain. - Dexilant 645mpo qd prn (samples given) - Proceed with EGD and colonoscopy.  I have discussed the risks and benefits.  The risks including risk of perforation requiring laparotomy, bleeding after biopsies/polypectomy requiring blood transfusions and risks of anesthesia/sedation were discussed.  Rare risks of missing UGI and colorectal neoplasms were also discussed.  Alternatives were also given.  Patient is fully aware and agrees to proceed. All the questions were answered. Procedures will be scheduled in upcoming days.  Patient is to report immediately if there is any significant weight loss or excessive bleeding until then. Consent forms were given for review.  HPI:    Christina Potts a [age].o. 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  Diagnosed with IBS. Status post neck surgery in June 2019 (ACD-anterior cervical decompression), using only minimal Percocet now Has been more constipated.  Still with alternating diarrhea Lower abdominal pain-gets better with defecation Abdominal bloating which also gets better with stools, mucus in the stools Few episodes of rectal bleeding mixed with the stools. Also has been having heartburn  intermittently.  Occasional epigastric pain especially after eating. Would like to get EGD and colonoscopy performed. Has history of previous colonic polyps several years ago, negative colonoscopy 10 years ago. Previous history of acute sigmoid diverticulitis-few years ago treated with antibiotics Cipro and Flagyl.  She got a letter to get repeat colonoscopy. Daughter has IBS with constipation    Past Medical History:  Diagnosis Date  . ADD (attention deficit disorder)   . Anemia    as child  . Anxiety   . Arthritis   . Asthma   [place]  . BRCA negative 01/2013, 9/19   BRCA/Colaris neg; MyRisk neg 9/19  . Chronic headaches   . Colon polyps    2010-2011  . DDD (degenerative disc disease), cervical   . Depression   . Diverticulosis   . Family history of breast cancer 07/2018   IBIS=12.4%  . GERD (gastroesophageal reflux disease)   . History of snoring   . Hypertension   . IBS (irritable bowel syndrome)   . Migraines    had one 2 weeks ago  . Pneumonia    2014  . PONV (postoperative nausea and vomiting)   . Spondylolisthesis of lumbar region     Past Surgical History:  Procedure Laterality Date  . ABDOMINAL HYSTERECTOMY  11/08/2000   TAH RSO, LOA due to pelvic pain/menometrorrhagia. Dr. VaAmmie DaltonPartical  . ANTERIOR CERVICAL DECOMP/DISCECTOMY FUSION N/A 05/25/2018   Procedure: ANTERIOR CERVICAL DECOMPRESSION FUSION CERVICAL FOUR-FIVE,CERVICAL FIVE-SIX,CERVICAL SIX-SEVEN.;  Surgeon: JoEustace MooreMD;  [facility] Service[facility];  Laterality: N/A;  right side approach  . BACK SURGERY  08/2017   Lumbar surgery  with Dr. Sherley Bounds  . CARPAL TUNNEL RELEASE Left   . CESAREAN SECTION     X 2  . COLONOSCOPY     2009, 2010, 2011  . ESOPHAGOGASTRODUODENOSCOPY  02/13/2009   Minimal gastritis. Otherwise normal EGD.   Marland Kitchen HAND SURGERY Left   . LUMBAR FUSION  2019  . WRIST SURGERY Left    Cartlige bremoved    Family History  Problem  Relation Age of Onset  . Lung cancer Maternal Aunt        43s  . Breast cancer Paternal Aunt   . Prostate cancer Paternal Uncle   . Liver cancer Maternal Grandmother        liver disease  . Diabetes Maternal Grandmother   . Ovarian cancer Other   . Breast cancer Other        5 cousins, 31-50 yrs old  . Ovarian cancer Cousin   . Breast cancer Cousin   . Brain cancer Cousin   . Stomach cancer Cousin   . Aneurysm Cousin   . Irritable bowel syndrome Mother   . Kidney disease Maternal Grandfather   . Diabetes Maternal Grandfather   . Liver disease Maternal Grandfather   . Diabetes Maternal Aunt   . Colon cancer Neg Hx     Social History   Tobacco Use  . Smoking status: Never Smoker  . Smokeless tobacco: Never Used  Substance Use Topics  . Alcohol use: Not Currently    Comment: rare  . Drug use: No    Current Outpatient Medications  Medication Sig Dispense Refill  . albuterol (PROVENTIL HFA;VENTOLIN HFA) 108 (90 Base) MCG/ACT inhaler Inhale 2 puffs into the lungs every 4 (four) hours as needed for wheezing or shortness of breath.    Marland Kitchen buPROPion (WELLBUTRIN XL) 300 MG 24 hr tablet Take 300 mg by mouth daily.    . diazepam (VALIUM) 5 MG tablet TK 1 TO 2 TS PO ONE HOUR B MRI  0  . losartan-hydrochlorothiazide (HYZAAR) 50-12.5 MG per tablet Take 1 tablet by mouth at bedtime.     . methocarbamol (ROBAXIN) 500 MG tablet Take 1 tablet (500 mg total) by mouth 3 (three) times daily as needed. 60 tablet 1  . methylphenidate (RITALIN) 10 MG tablet Take 20 mg by mouth 2 (two) times daily.     . mometasone (ELOCON) 0.1 % cream Apply 1 application topically daily as needed (excema).     . ondansetron (ZOFRAN-ODT) 4 MG disintegrating tablet Take 4 mg by mouth every 8 (eight) hours as needed for nausea or vomiting.     Marland Kitchen oxyCODONE-acetaminophen (PERCOCET/ROXICET) 5-325 MG tablet Take 1-2 tablets by mouth every 6 (six) hours as needed (FOR PAIN.). 50 tablet 0  . SUMAtriptan (IMITREX) 100 MG  tablet Take 100 mg by mouth every 2 (two) hours as needed for migraine or headache.   2   No current facility-administered medications for this visit.     Allergies  Allergen Reactions  . Dilaudid [Hydromorphone Hcl] Anaphylaxis and Shortness Of Breath    Patient reports 09/21/17 she tolerates oxycodone fine  . Strawberry (Diagnostic) Anaphylaxis, Hives and Swelling  . Morphine And Related Rash and Other (See Comments)    Burns my skin     Review of Systems:  Constitutional: Denies fever, chills, diaphoresis, appetite change and fatigue.  HEENT: Denies photophobia, eye pain, redness, hearing loss, ear pain, congestion, sore throat, rhinorrhea, sneezing, mouth sores, neck pain, neck stiffness and tinnitus.   Respiratory: Denies SOB,  DOE, cough, chest tightness,  and wheezing.   Cardiovascular: Denies chest pain, palpitations and leg swelling.  Genitourinary: Denies dysuria, urgency, frequency, hematuria, flank pain and difficulty urinating.  Musculoskeletal: Denies myalgias, back pain, joint swelling, arthralgias and gait problem.  Skin: No rash.  Neurological: Denies dizziness, seizures, syncope, weakness, light-headedness, numbness and headaches.  Hematological: Denies adenopathy. Easy bruising, personal or family bleeding history  Psychiatric/Behavioral: Has anxiety or depression     Physical Exam:    BP 136/88   Pulse 88   Ht '4\' 11"'  (1.499 m)   Wt 188 lb 6 oz (85.4 kg)   BMI 38.05 kg/m  Filed Weights   09/01/18 1511  Weight: 188 lb 6 oz (85.4 kg)   Constitutional:  Well-developed, in no acute distress. Psychiatric: Normal mood and affect. Behavior is normal. HEENT: Pupils normal.  Conjunctivae are normal. No scleral icterus. Neck supple.  Well-healed surgical scar. Cardiovascular: Normal rate, regular rhythm. No edema Pulmonary/chest: Effort normal and breath sounds normal. No wheezing, rales or rhonchi. Abdominal: Soft, nondistended. Nontender. Bowel sounds active  throughout. There are no masses palpable. No hepatomegaly. Rectal: To be performed at the time of colonoscopy Neurological: Alert and oriented to person place and time. Skin: Skin is warm and dry. No rashes noted.  Data Reviewed: I have personally reviewed following labs and imaging studies  CBC: CBC Latest Ref Rng & Units 05/17/2018 09/15/2017 04/21/2017  WBC 4.0 - 10.5 K/uL 9.2 8.6 8.6  Hemoglobin 12.0 - 15.0 g/dL 15.1(H) 14.8 15.7(H)  Hematocrit 36.0 - 46.0 % 46.9(H) 44.7 46.2(H)  Platelets 150 - 400 K/uL 321 287 303    CMP: CMP Latest Ref Rng & Units 07/26/2018 05/17/2018 09/15/2017  Glucose 65 - 99 mg/dL 92 85 85  BUN 6 - 24 mg/dL '6 17 12  ' Creatinine 0.57 - 1.00 mg/dL 0.86 0.85 1.05(H)  Sodium 134 - 144 mmol/L 142 138 136  Potassium 3.5 - 5.2 mmol/L 4.1 3.3(L) 3.6  Chloride 96 - 106 mmol/L 101 97(L) 100(L)  CO2 20 - 29 mmol/L '24 30 28  ' Calcium 8.7 - 10.2 mg/dL 9.1 9.0 8.7(L)  Total Protein 6.0 - 8.5 g/dL 6.4 - -  Total Bilirubin 0.0 - 1.2 mg/dL 0.2 - -  Alkaline Phos 39 - 117 IU/L 78 - -  AST 0 - 40 IU/L 17 - -  ALT 0 - 32 IU/L 19 - -  Seen in presence of Christina Potts CMA      Carmell Austria, MD 09/01/2018, 3:37 PM  Cc: Christina Jude, MD

## 2018-09-01 NOTE — Patient Instructions (Addendum)
If you are age 45 or older, your body mass index should be between 23-30. Your Body mass index is 38.05 kg/m. If this is out of the aforementioned range listed, please consider follow up with your Primary Care Provider.  If you are age 64 or younger, your body mass index should be between 19-25. Your Body mass index is 38.05 kg/m. If this is out of the aformentioned range listed, please consider follow up with your Primary Care Provider.   You have been scheduled for an endoscopy and colonoscopy. Please follow the written instructions given to you at your visit today. Please pick up your prep supplies at the pharmacy within the next 1-3 days. If you use inhalers (even only as needed), please bring them with you on the day of your procedure. Your physician has requested that you go to www.startemmi.com and enter the access code given to you at your visit today. This web site gives a general overview about your procedure. However, you should still follow specific instructions given to you by our office regarding your preparation for the procedure.  We have sent the following medications to your pharmacy for you to pick up at your convenience: Bentyl 10 mg   We have given you samples of the following medication to take: Dexilant 60 mg   Drink plenty of water.   Thank you,  Dr. Lynann Bologna

## 2018-10-10 ENCOUNTER — Encounter: Payer: Self-pay | Admitting: Gastroenterology

## 2018-10-25 ENCOUNTER — Encounter: Payer: Self-pay | Admitting: Gastroenterology

## 2018-10-25 ENCOUNTER — Ambulatory Visit (AMBULATORY_SURGERY_CENTER): Payer: 59 | Admitting: Gastroenterology

## 2018-10-25 VITALS — BP 128/69 | HR 71 | Temp 97.5°F | Resp 13 | Ht 59.0 in | Wt 188.0 lb

## 2018-10-25 DIAGNOSIS — K2970 Gastritis, unspecified, without bleeding: Secondary | ICD-10-CM

## 2018-10-25 DIAGNOSIS — K5730 Diverticulosis of large intestine without perforation or abscess without bleeding: Secondary | ICD-10-CM | POA: Diagnosis present

## 2018-10-25 DIAGNOSIS — R1013 Epigastric pain: Secondary | ICD-10-CM

## 2018-10-25 DIAGNOSIS — Z86010 Personal history of colonic polyps: Secondary | ICD-10-CM

## 2018-10-25 DIAGNOSIS — K648 Other hemorrhoids: Secondary | ICD-10-CM | POA: Diagnosis not present

## 2018-10-25 MED ORDER — SODIUM CHLORIDE 0.9 % IV SOLN: 500.0000 mL | Freq: Once | INTRAVENOUS | Status: DC

## 2018-10-25 NOTE — Op Note (Signed)
[facility] Patient Name: Christina Potts Procedure Date: 10/25/2018 10:10 AM MRN: [id number] Endoscopist: Lynann Bolognaajesh Brandi Armato , MD Age: 47 Referring MD:  Date of Birth: [birth year] Gender: Female Account #: [id number] Procedure:                Upper GI endoscopy Indications:              Epigastric abdominal pain Medicines:                Monitored Anesthesia Care Procedure:                Pre-Anesthesia Assessment:                           - Prior to the procedure, a History and Physical                            was performed, and patient medications and                            allergies were reviewed. The patient's tolerance of                            previous anesthesia was also reviewed. The risks                            and benefits of the procedure and the sedation                            options and risks were discussed with the patient.                            All questions were answered, and informed consent                            was obtained. Prior Anticoagulants: The patient has                            taken no previous anticoagulant or antiplatelet                            agents. ASA Grade Assessment: III - A patient with                            severe systemic disease. After reviewing the risks                            and benefits, the patient was deemed in                            satisfactory condition to undergo the procedure.                           After obtaining informed consent, the endoscope was  passed under direct vision. Throughout the                            procedure, the patient's blood pressure, pulse, and                            oxygen saturations were monitored continuously. The                            Endoscope was introduced through the mouth, and                            advanced to the second part of duodenum. The upper                            GI endoscopy was  accomplished without difficulty.                            The patient tolerated the procedure well. Scope In: Scope Out: Findings:                 The examined esophagus was normal.                           Z-line was regular and was found 33 cm from the                            incisors. Examined by NBI.                           Localized mild inflammation characterized by                            erythema was found in the gastric antrum. Biopsies                            were taken with a cold forceps for histology.                           The examined duodenum was normal. Biopsies for                            histology were taken with a cold forceps for                            evaluation of celiac disease. Complications:            No immediate complications. Estimated Blood Loss:     Estimated blood loss: none. Impression:               - Mild Gastritis. Biopsied.                           - Otherwise normal EGD. Recommendation:           - Patient has a contact number available for  emergencies. The signs and symptoms of potential                            delayed complications were discussed with the                            patient. Return to normal activities tomorrow.                            Written discharge instructions were provided to the                            patient.                           - Resume previous diet.                           - Continue present medications.                           - Await pathology results.                           - Avoid nonsteroidals.                           - Return to GI clinic PRN. Will perform further                            work-up if still with problems. Lynann Bologna, MD 10/25/2018 10:40:28 AM This report has been signed electronically.

## 2018-10-25 NOTE — Progress Notes (Signed)
Called to room to assist during endoscopic procedure.  Patient ID and intended procedure confirmed with present staff. Received instructions for my participation in the procedure from the performing physician.  

## 2018-10-25 NOTE — Patient Instructions (Signed)
YOU HAD AN ENDOSCOPIC PROCEDURE TODAY AT THE [facility]:   Refer to the procedure report that was given to you for any specific questions about what was found during the examination.  If the procedure report does not answer your questions, please call your gastroenterologist to clarify.  If you requested that your care partner not be given the details of your procedure findings, then the procedure report has been included in a sealed envelope for you to review at your convenience later.  YOU SHOULD EXPECT: Some feelings of bloating in the abdomen. Passage of more gas than usual.  Walking can help get rid of the air that was put into your GI tract during the procedure and reduce the bloating. If you had a lower endoscopy (such as a colonoscopy or flexible sigmoidoscopy) you may notice spotting of blood in your stool or on the toilet paper. If you underwent a bowel prep for your procedure, you may not have a normal bowel movement for a few days.  Please Note:  You might notice some irritation and congestion in your nose or some drainage.  This is from the oxygen used during your procedure.  There is no need for concern and it should clear up in a day or so.  SYMPTOMS TO REPORT IMMEDIATELY:   Following lower endoscopy (colonoscopy or flexible sigmoidoscopy):  Excessive amounts of blood in the stool  Significant tenderness or worsening of abdominal pains  Swelling of the abdomen that is new, acute  Fever of 100F or higher   Following upper endoscopy (EGD)  Vomiting of blood or coffee ground material  New chest pain or pain under the shoulder blades  Painful or persistently difficult swallowing  New shortness of breath  Fever of 100F or higher  Black, tarry-looking stools  Please see handouts given to you on Gastritis, Diverticulosis and Hemorrhoids.  For urgent or emergent issues, a gastroenterologist can be reached at any hour by calling [phone].   DIET:  We do  recommend a small meal at first, but then you may proceed to your regular diet.  Drink plenty of fluids but you should avoid alcoholic beverages for 24 hours.  ACTIVITY:  You should plan to take it easy for the rest of today and you should NOT DRIVE or use heavy machinery until tomorrow (because of the sedation medicines used during the test).    FOLLOW UP: Our staff will call the number listed on your records the next business day following your procedure to check on you and address any questions or concerns that you may have regarding the information given to you following your procedure. If we do not reach you, we will leave a message.  However, if you are feeling well and you are not experiencing any problems, there is no need to return our call.  We will assume that you have returned to your regular daily activities without incident.  If any biopsies were taken you will be contacted by phone or by letter within the next 1-3 weeks.  Please call us at [phone] if you have not heard about the biopsies in 3 weeks.    SIGNATURES/CONFIDENTIALITY: You and/or your care partner have signed paperwork which will be entered into your electronic medical record.  These signatures attest to the fact that that the information above on your After Visit Summary has been reviewed and is understood.  Full responsibility of the confidentiality of this discharge information lies with you and/or your care-partner.  Thank you for letting us take care of your heatlhcare needs today.

## 2018-10-25 NOTE — Op Note (Signed)
[facility] Patient Name: Christina HerterGarlena Miotke Procedure Date: 10/25/2018 10:09 AM MRN: [id number] Endoscopist: Lynann Bolognaajesh Laurencia Roma , MD Age: 47 Referring MD:  Date of Birth: [birth year] Gender: Female Account #: [id number] Procedure:                Colonoscopy Indications:              #1. IBS with constipation -occ percocet after ACD                            (anterior cervical decompression) sx June 2019                           #2. Rectal bleeding. Hb normal 04/2018.                           #3. History of colonic polyps and diverticulosis.                            Colon 01/2008: Sigmoid diverticulosis, otherwise                            normal to TI. Negative random colonic and TI Bx. Medicines:                Monitored Anesthesia Care Procedure:                Pre-Anesthesia Assessment:                           - Prior to the procedure, a History and Physical                            was performed, and patient medications and                            allergies were reviewed. The patient's tolerance of                            previous anesthesia was also reviewed. The risks                            and benefits of the procedure and the sedation                            options and risks were discussed with the patient.                            All questions were answered, and informed consent                            was obtained. Prior Anticoagulants: The patient has                            taken no previous anticoagulant or antiplatelet  agents. ASA Grade Assessment: III - A patient with                            severe systemic disease. After reviewing the risks                            and benefits, the patient was deemed in                            satisfactory condition to undergo the procedure.                           After obtaining informed consent, the colonoscope                            was passed under direct  vision. Throughout the                            procedure, the patient's blood pressure, pulse, and                            oxygen saturations were monitored continuously. The                            Colonoscope was introduced through the anus and                            advanced to the 2 cm into the ileum. The                            colonoscopy was performed without difficulty. The                            patient tolerated the procedure well. The quality                            of the bowel preparation was excellent. Scope In: 10:23:40 AM Scope Out: 10:35:07 AM Scope Withdrawal Time: 0 hours 7 minutes 48 seconds  Total Procedure Duration: 0 hours 11 minutes 27 seconds  Findings:                 Multiple small-mouthed diverticula were found in                            the sigmoid colon, few in descending colon and                            ascending colon.                           Non-bleeding internal hemorrhoids were found during                            retroflexion. The hemorrhoids were small.  The terminal ileum appeared normal.                           The exam was otherwise without abnormality on                            direct and retroflexion views. Complications:            No immediate complications. Estimated Blood Loss:     Estimated blood loss: none. Impression:               -Pancolonic diverticulosis predominantly in the                            sigmoid colon.                           -Small internal hemorrhoids (likely etiology of                            rectal bleeding, no bleeding currently)                           -Otherwise normal colonoscopy to TI. Recommendation:           - Patient has a contact number available for                            emergencies. The signs and symptoms of potential                            delayed complications were discussed with the                            patient.  Return to normal activities tomorrow.                            Written discharge instructions were provided to the                            patient.                           - High fiber diet. Minimize pain medications. If                            still with constipation, start stool                            softeners-Colace 1 tablet p.o. once a day.                           - Continue present medications.                           - Preparation H 1 twice daily after the bowel  movement for 7 to 10 days PRN rectal bleeding                           - Repeat colonoscopy in 10 years for screening                            purposes. Earlier, if with any new problems or if                            there is any change in family history.                           - Return to GI office PRN. Lynann Bologna, MD 10/25/2018 10:47:04 AM This report has been signed electronically.

## 2018-10-25 NOTE — Progress Notes (Signed)
PT taken to PACU. Monitors in place. VSS. Report given to RN. 

## 2018-10-30 ENCOUNTER — Telehealth: Payer: Self-pay

## 2018-10-30 ENCOUNTER — Telehealth: Payer: Self-pay | Admitting: *Deleted

## 2018-10-30 NOTE — Telephone Encounter (Signed)
  Follow up Call-  Call back number 10/25/2018  Post procedure Call Back phone  # [phone]  Permission to leave phone message Yes  Some recent data might be hidden     Patient questions:  Do you have a fever, pain , or abdominal swelling? No. Pain Score  0 *  Have you tolerated food without any problems? Yes.    Have you been able to return to your normal activities? Yes.    Do you have any questions about your discharge instructions: Diet   No. Medications  No. Follow up visit  No.  Do you have questions or concerns about your Care? No.  Actions: * If pain score is 4 or above: No action needed, pain <4.

## 2018-10-30 NOTE — Telephone Encounter (Signed)
Left message on answering machine. 

## 2018-10-31 IMAGING — MG MM DIGITAL SCREENING BILAT W/ TOMO W/ CAD
8 series · 8 of 24 positions shown · non-contrast
Comparison: Previous exam(s).

CLINICAL DATA: Screening.

EXAM:
DIGITAL SCREENING BILATERAL MAMMOGRAM WITH TOMO AND CAD

[L MLO synth-2D]
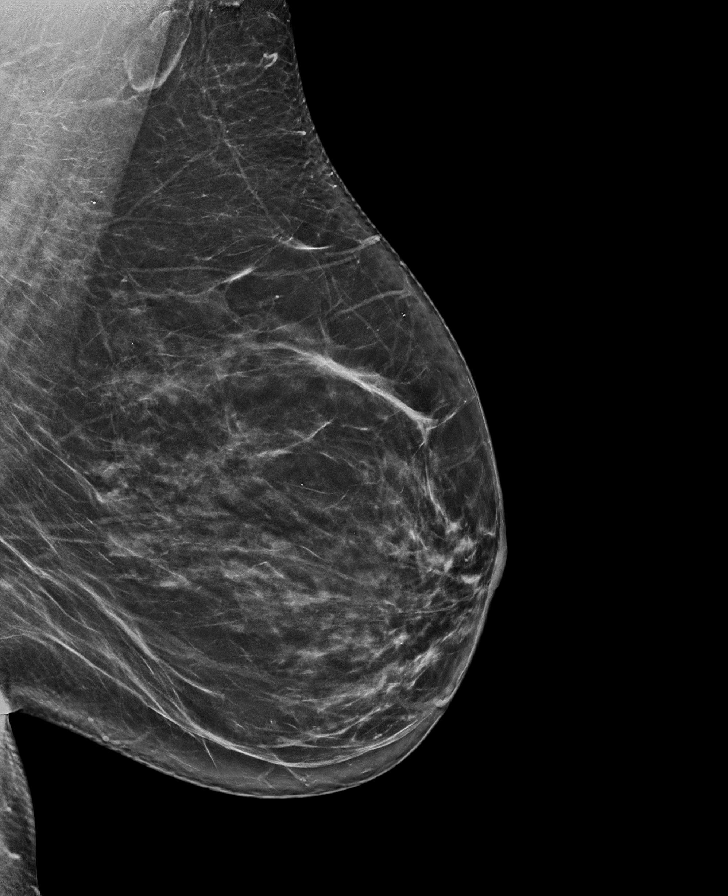

[R CC synth-2D]
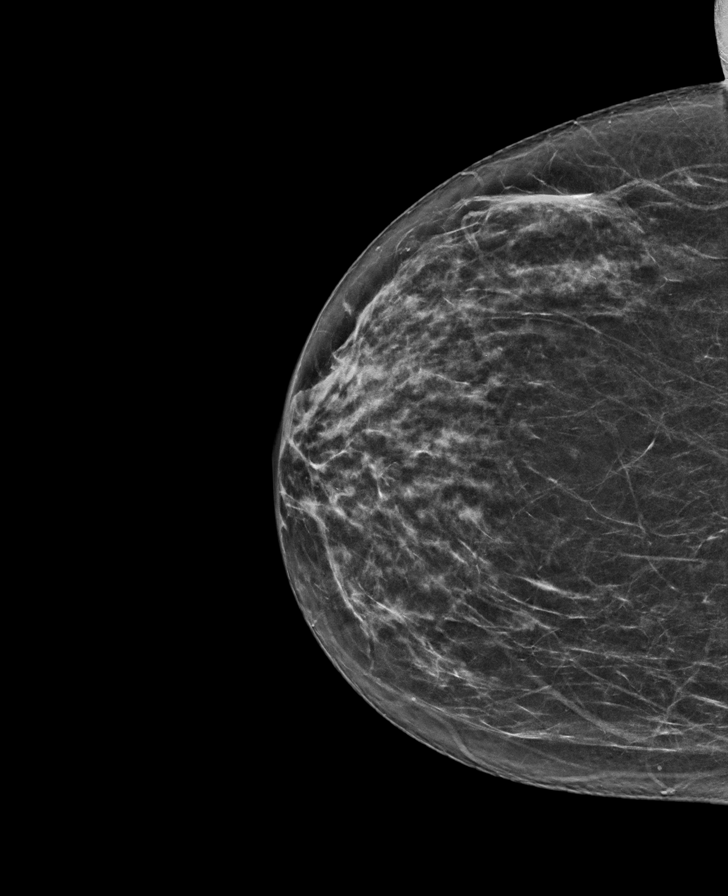

[L CC synth-2D]
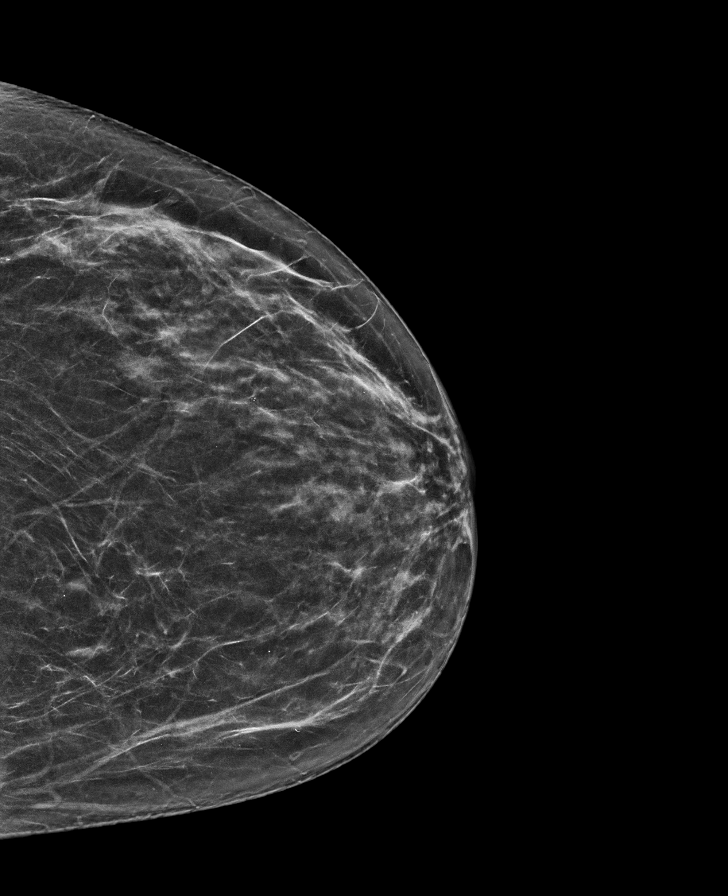

[R MLO synth-2D]
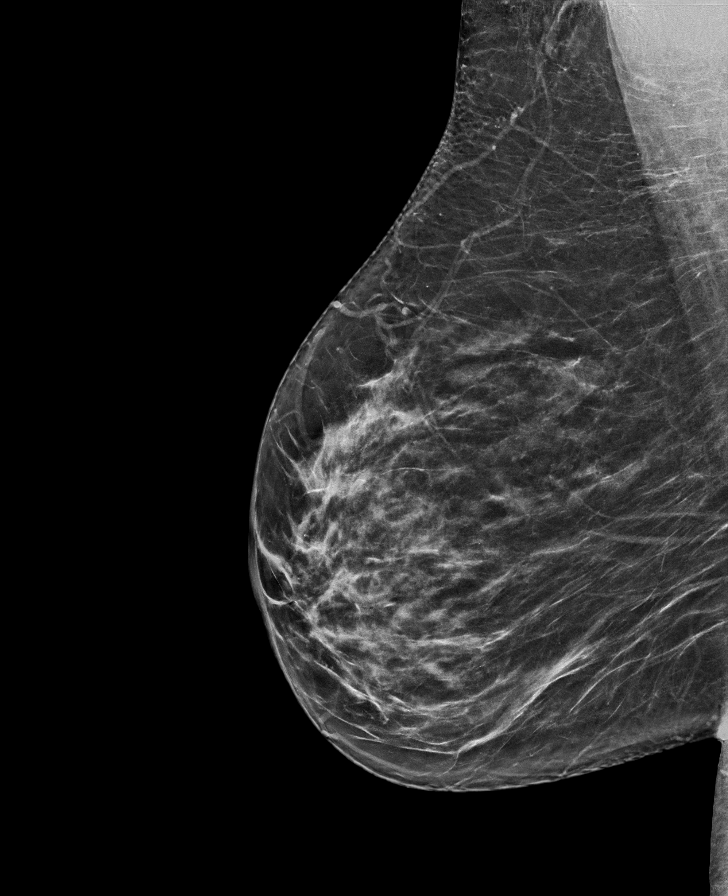

[R CC tomo · tomo slice 33/66.0]
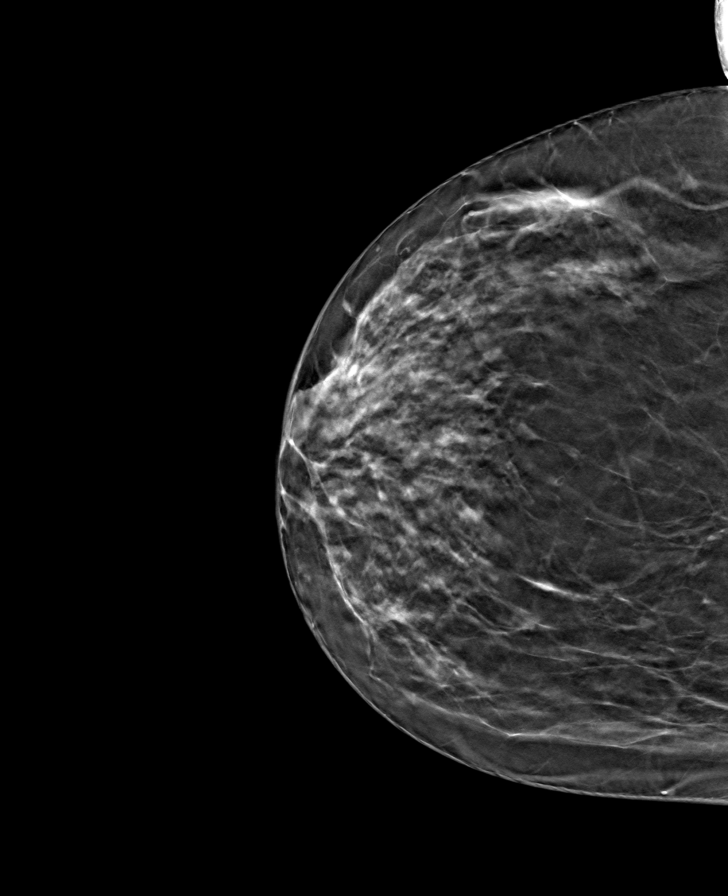

[L CC tomo · tomo slice 35/68.0]
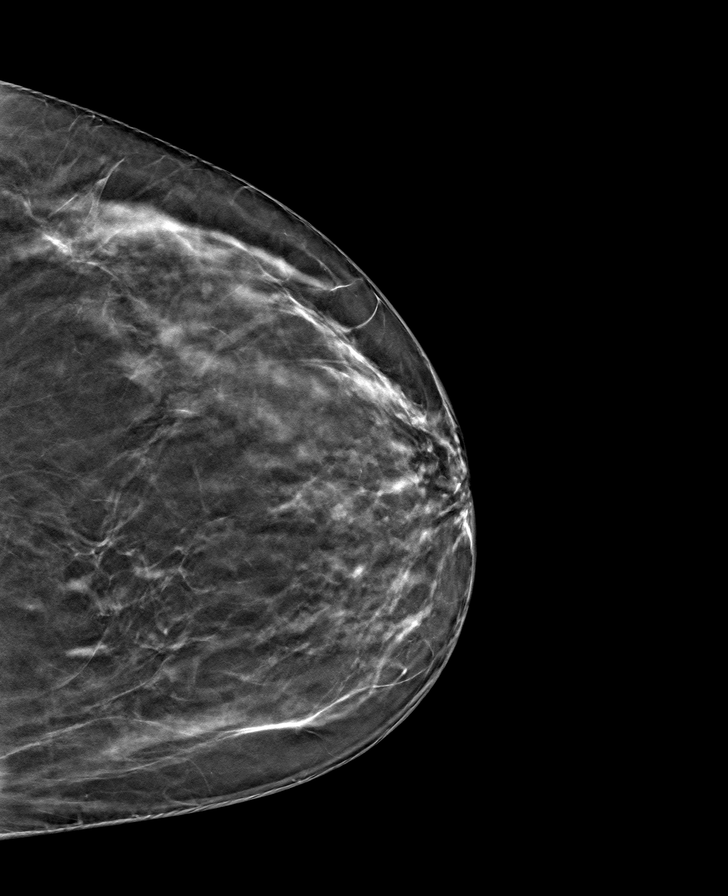

[R MLO tomo · tomo slice 37/74.0]
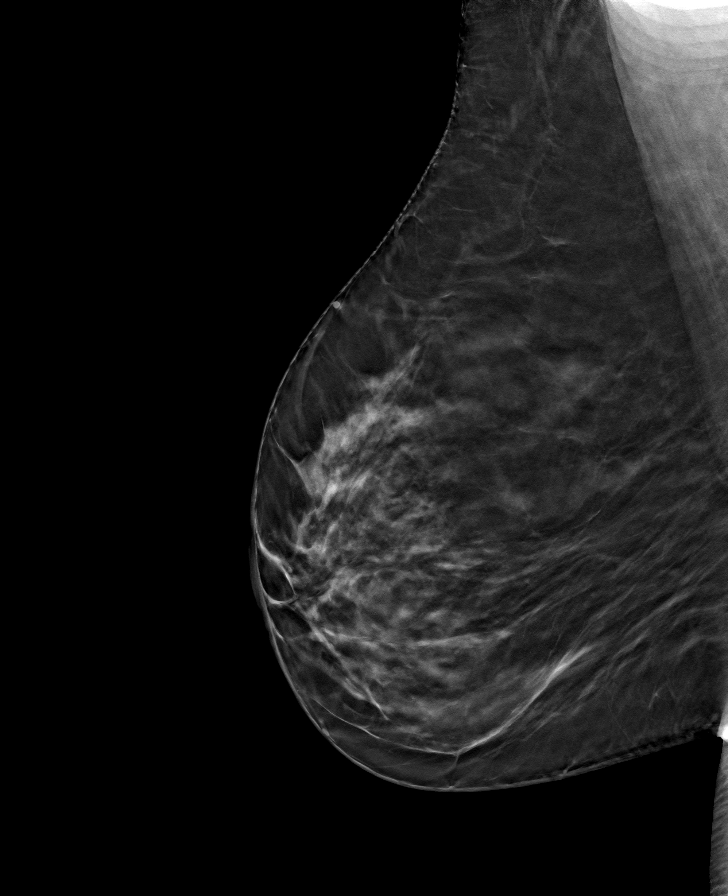

[L MLO tomo · tomo slice 39/77.0]
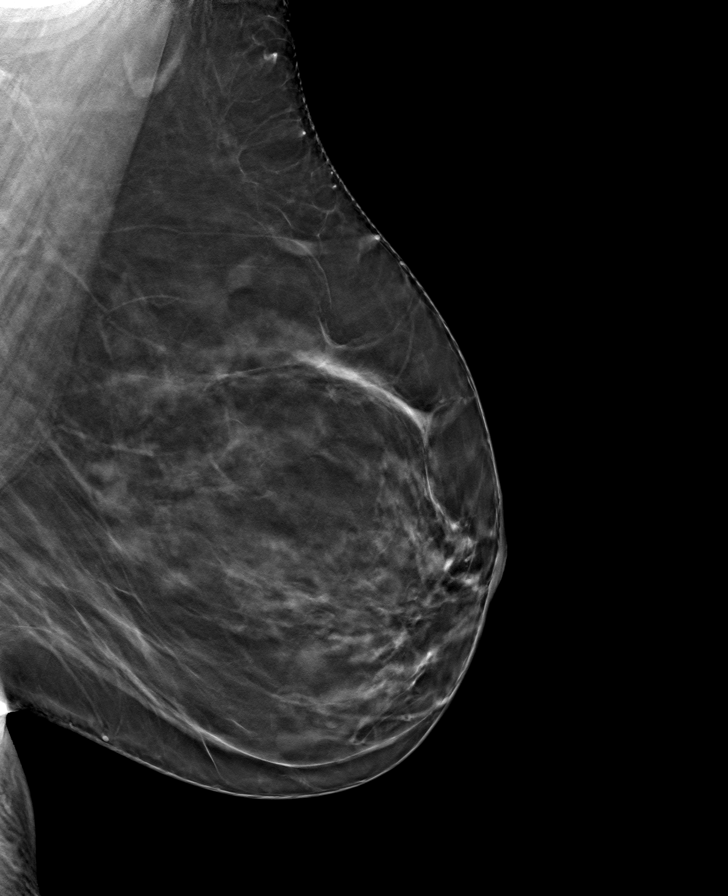

[8 of 24 positions shown; findings below may reference images not displayed]

ACR Breast Density Category b: There are scattered areas of
fibroglandular density.
FINDINGS: There are no findings suspicious for malignancy. Images were
processed with CAD.
IMPRESSION: No mammographic evidence of malignancy. A result letter of this
screening mammogram will be mailed directly to the patient.

RECOMMENDATION:
Screening mammogram in one year. (Code:[id number])

BI-RADS CATEGORY  1: Negative.

## 2018-11-06 ENCOUNTER — Encounter: Payer: Self-pay | Admitting: Gastroenterology

## 2018-12-23 ENCOUNTER — Other Ambulatory Visit: Payer: Self-pay | Admitting: Gastroenterology

## 2019-01-17 ENCOUNTER — Telehealth: Payer: Self-pay | Admitting: Gastroenterology

## 2019-01-17 NOTE — Telephone Encounter (Signed)
A lot of pain on lft side and nausea. Believe she is having a episode of diverticulosis. Wants to know if medication can be sent in to help with the symptoms.

## 2019-01-18 NOTE — Telephone Encounter (Signed)
Called and spoke with patient- patient reports these symptoms started about a week to a week and a half ago-noticed the pain increasing, decreased appetite, nausea began yesterday-patient "feels like I am in a fog" -LLQ and isolated right side area-patient also complains of all over generalized abd pain-patient reports she has taken left over Flagyl (from previous episode of abd pain)-noticed a small amount of improvement-feeling a lot of pressure, soreness, tenderness- when patient goes from sitting to standing pain is extremely painful-nausea has been progressively worse over the last few hours-unable to consume food (other than soup and crackers-to be able to take medication-took a small amount of Phenergan last night to help with nausea-not much relief of nausea- Patient also reports blood in stool with mucous-does not know if it coming from straining to have a bowel movement-bloated feeling as well;  Please advise after reviewing initial patient message as well

## 2019-01-18 NOTE — Telephone Encounter (Signed)
Patient called back to office-patient reports pain is increasing and wanted to know if she can be seen today or if she needs to go to the emergency room-patient advised to go tot he emergency dept if she feels the need or if her pain is uncontrolled by medications taken at home; patient reports she will go to the ER (that is closer to home);   Called and spoke with Dr. Vicenta Dunning recommends that the patient go to the ER due to increase in pain and symptoms not being managed at home;   [facility] and spoke with patient- patient advised of MD recommendations and agrees with plan of care; MD requested patient call back to the office on Monday to update MD with symptoms/symptom management; Patient was advised to call back if questions/concerns arise;  Patient verbalized understanding of information/instructions;

## 2019-01-18 NOTE — Telephone Encounter (Signed)
Attempted to reach patient for additional information concerning symptoms-will attempt to reach patient at a later time;

## 2019-01-18 NOTE — Telephone Encounter (Signed)
Discussed with Christina Potts Likely acute diverticulitis but significant pain. Plan: -Refer her to local ED ([place]) -She would need blood work and a CT scan. -She would also need pain control and likely antibiotics. -Christina Potts, pl check on her tomorrow over the phone. -Cannot bring her to the clinic as she lives an hour away and due to bad weather.

## 2019-01-18 NOTE — Telephone Encounter (Signed)
Pt Return call please call pt back at [phone]

## 2019-01-24 ENCOUNTER — Encounter: Payer: Self-pay | Admitting: Gastroenterology

## 2019-01-24 ENCOUNTER — Ambulatory Visit (INDEPENDENT_AMBULATORY_CARE_PROVIDER_SITE_OTHER): Payer: 59 | Admitting: Gastroenterology

## 2019-01-24 ENCOUNTER — Other Ambulatory Visit: Payer: Self-pay | Admitting: Gastroenterology

## 2019-01-24 VITALS — BP 108/84 | HR 76 | Ht 59.0 in | Wt 184.4 lb

## 2019-01-24 DIAGNOSIS — K5792 Diverticulitis of intestine, part unspecified, without perforation or abscess without bleeding: Secondary | ICD-10-CM | POA: Diagnosis not present

## 2019-01-24 LAB — CBC WITH DIFFERENTIAL/PLATELET
ABSOLUTE MONOCYTES: 493 {cells}/uL (ref 200–950)
BASOS PCT: 0.7 %
Basophils Absolute: 54 cells/uL (ref 0–200)
EOS ABS: 69 {cells}/uL (ref 15–500)
Eosinophils Relative: 0.9 %
HEMATOCRIT: 44.7 % (ref 35.0–45.0)
HEMOGLOBIN: 15.5 g/dL (ref 11.7–15.5)
LYMPHS ABS: 1856 {cells}/uL (ref 850–3900)
MCH: 31.5 pg (ref 27.0–33.0)
MCHC: 34.7 g/dL (ref 32.0–36.0)
MCV: 90.9 fL (ref 80.0–100.0)
MPV: 10.5 fL (ref 7.5–12.5)
Monocytes Relative: 6.4 %
Neutro Abs: 5228 cells/uL (ref 1500–7800)
Neutrophils Relative %: 67.9 %
PLATELETS: 365 10*3/uL (ref 140–400)
RBC: 4.92 10*6/uL (ref 3.80–5.10)
RDW: 11.7 % (ref 11.0–15.0)
TOTAL LYMPHOCYTE: 24.1 %
WBC: 7.7 10*3/uL (ref 3.8–10.8)

## 2019-01-24 NOTE — Progress Notes (Signed)
Chief Complaint: FU  Referring Provider:  Lynnell Jude, MD      ASSESSMENT AND PLAN;   #1. Recent diverticulitis 01/19/2019 on CT at [facility]. Rx with augmentin. Neg colon 09/2018 except for pancolonic diverticulosis. #2. IBS with constipation (with alt diarrhea)- occ percocet after ACD (anterior cervical decompression) sx June 2019 #3. GERD with epigastric pain. #4. History of colonic polyps and diverticulosis. Colon 01/2008: Sigmoid diverticulosis, otherwise normal to TI.  Negative random colonic and TI Bx.  Plan: - Increase water intake. - Finish antibiotics. - Minimize pain medications including nonsteroidals. - Miralax 17g po bid. - Once she is done with antibiotics, start Benefiber 1 TBS po qd with 8 oz water. - Probiotics -samples of restora 1 tablet p.o. once a day given - If still with problems, will give a trial of Amitiza, Linzess or Trulance.  - Bentyl only in prn. - CBC with diff.  - Dexilant 76m po qd (samples). - FU in 12 weeks.  HPI:    Christina Godfreyis a 48y.o. female  Works in the lab (Engineer, building services at [employer] With over 15-year history of abdominal pain alternating with diarrhea and constipation.  Diagnosed with IBS. Status post neck surgery in June 2019 (ACD-anterior cervical decompression), using only minimal Percocet now  Had an episode of acute left lower quadrant abdominal pain.  [facility]  Underwent CT scan of the abdomen and pelvis which showed descending colon diverticulitis without any abscess.  She has been started on Augmentin 875 p.o. twice daily.  No fever or chills  Has done well.  Minimal abdominal pain.  No fever or chills.  She had normal CBC.  Doing well from the upper GI standpoint with Dexilant.  Wants to avoid any surgery.  EGD and colonoscopy 10/25/2018.    Past Medical History:  Diagnosis Date  . ADD (attention deficit disorder)   . Anemia    as child    . Anxiety   . Arthritis   . Asthma   . Bowel [place]   . BRCA negative 01/2013, 9/19   BRCA/Colaris neg; MyRisk neg 9/19  . Chronic headaches   . Colon polyps    2010-2011  . DDD (degenerative disc disease), cervical   . Depression   . Diverticulitis   . Diverticulosis   . Family history of breast cancer 07/2018   IBIS=12.4%  . GERD (gastroesophageal reflux disease)   . History of snoring   . Hypertension   . IBS (irritable bowel syndrome)   . Migraines    had one 2 weeks ago  . Pneumonia    2014  . PONV (postoperative nausea and vomiting)   . Spondylolisthesis of lumbar region     Past Surgical History:  Procedure Laterality Date  . ABDOMINAL HYSTERECTOMY  11/08/2000   TAH RSO, LOA due to pelvic pain/menometrorrhagia. Dr. VAmmie Dalton Partical  . ANTERIOR CERVICAL DECOMP/DISCECTOMY FUSION N/A 05/25/2018   Procedure: ANTERIOR CERVICAL DECOMPRESSION FUSION CERVICAL FOUR-FIVE,CERVICAL FIVE-SIX,CERVICAL SIX-SEVEN.;  Surgeon: JEustace Moore MD;  [place]  Service: Neurosurgery;  Laterality: N/A;  right side approach  . BACK SURGERY  08/2017   Lumbar surgery with Dr. DSherley Bounds . CARPAL TUNNEL RELEASE Left   . CESAREAN SECTION     X 2  . COLONOSCOPY     2009  . ESOPHAGOGASTRODUODENOSCOPY  02/13/2009   Minimal gastritis. Otherwise normal EGD.   .Marland KitchenHAND SURGERY Left   .  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 WRIST SURGERY Left    [place] bremoved    Family History  Problem Relation Age of Onset  . Lung cancer Maternal Aunt        37s  . Breast cancer Paternal Aunt   . Prostate cancer Paternal Uncle   . Liver cancer Maternal Grandmother        liver disease  . Diabetes Maternal Grandmother   . Ovarian cancer Other   . Breast cancer Other        5 cousins, 59-50 yrs old  . Ovarian cancer Cousin   . Breast cancer Cousin   . Brain cancer Cousin   . Stomach cancer Cousin   . Aneurysm Cousin   . Colon cancer Cousin   . Irritable bowel  syndrome Mother   . Kidney disease Maternal Grandfather   . Diabetes Maternal Grandfather   . Liver disease Maternal Grandfather   . Diabetes Maternal Aunt     Social History   Tobacco Use  . Smoking status: Never Smoker  . Smokeless tobacco: Never Used  Substance Use Topics  . Alcohol use: Not Currently    Comment: rare  . Drug use: No    Current Outpatient Medications  Medication Sig Dispense Refill  . albuterol (PROVENTIL HFA;VENTOLIN HFA) 108 (90 Base) MCG/ACT inhaler Inhale 2 puffs into the lungs every 4 (four) hours as needed for wheezing or shortness of breath.    Marland Kitchen buPROPion (WELLBUTRIN XL) 300 MG 24 hr tablet Take 300 mg by mouth daily.    Marland Kitchen dexlansoprazole (DEXILANT) 60 MG capsule Take 1 capsule (60 mg total) by mouth daily. 15 capsule 0  . diazepam (VALIUM) 5 MG tablet TK 1 TO 2 TS PO ONE HOUR B MRI  0  . dicyclomine (BENTYL) 10 MG capsule TAKE 1 CAPSULE (10 MG TOTAL) BY MOUTH 2 (TWO) TIMES DAILY AS NEEDED FOR SPASMS. 180 capsule 1  . losartan-hydrochlorothiazide (HYZAAR) 50-12.5 MG per tablet Take 1 tablet by mouth at bedtime.     . methocarbamol (ROBAXIN) 500 MG tablet Take 1 tablet (500 mg total) by mouth 3 (three) times daily as needed. 60 tablet 1  . methylphenidate (RITALIN) 10 MG tablet Take 20 mg by mouth 2 (two) times daily.     . mometasone (ELOCON) 0.1 % cream Apply 1 application topically daily as needed (excema).     . ondansetron (ZOFRAN-ODT) 4 MG disintegrating tablet Take 4 mg by mouth every 8 (eight) hours as needed for nausea or vomiting.     Marland Kitchen oxyCODONE-acetaminophen (PERCOCET/ROXICET) 5-325 MG tablet Take 1-2 tablets by mouth every 6 (six) hours as needed (FOR PAIN.). 50 tablet 0  . SUMAtriptan (IMITREX) 100 MG tablet Take 100 mg by mouth every 2 (two) hours as needed for migraine or headache.   2   No current facility-administered medications for this visit.     Allergies  Allergen Reactions  . Dilaudid [Hydromorphone Hcl] Anaphylaxis and  Shortness Of Breath    Patient reports 09/21/17 she tolerates oxycodone fine  . Strawberry (Diagnostic) Anaphylaxis, Hives and Swelling  . Morphine And Related Rash and Other (See Comments)    Burns my skin     Review of Systems:  neg     Physical Exam:    BP 108/84   Pulse 76   Ht '4\' 11"'  (1.499 m)   Wt 184 lb 6 oz (83.6 kg)   SpO2 97%   BMI 37.24  kg/m  Filed Weights   01/24/19 0840  Weight: 184 lb 6 oz (83.6 kg)   Constitutional:  Well-developed, in no acute distress. Psychiatric: Normal mood and affect. Behavior is normal. HEENT: Pupils normal.  Conjunctivae are normal. No scleral icterus. Neck supple.  Well-healed surgical scar. Cardiovascular: Normal rate, regular rhythm. No edema Pulmonary/chest: Effort normal and breath sounds normal. No wheezing, rales or rhonchi. Abdominal: Soft, nondistended.  Minimal lower abdominal tenderness without rebound.  Bowel sounds active throughout. There are no masses palpable. No hepatomegaly. Rectal: To be performed at the time of colonoscopy Neurological: Alert and oriented to person place and time. Skin: Skin is warm and dry. No rashes noted.  Data Reviewed: I have personally reviewed following labs and imaging studies  CBC: CBC Latest Ref Rng & Units 05/17/2018 09/15/2017 04/21/2017  WBC 4.0 - 10.5 K/uL 9.2 8.6 8.6  Hemoglobin 12.0 - 15.0 g/dL 15.1(H) 14.8 15.7(H)  Hematocrit 36.0 - 46.0 % 46.9(H) 44.7 46.2(H)  Platelets 150 - 400 K/uL 321 287 303    CMP: CMP Latest Ref Rng & Units 07/26/2018 05/17/2018 09/15/2017  Glucose 65 - 99 mg/dL 92 85 85  BUN 6 - 24 mg/dL '6 17 12  ' Creatinine 0.57 - 1.00 mg/dL 0.86 0.85 1.05(H)  Sodium 134 - 144 mmol/L 142 138 136  Potassium 3.5 - 5.2 mmol/L 4.1 3.3(L) 3.6  Chloride 96 - 106 mmol/L 101 97(L) 100(L)  CO2 20 - 29 mmol/L '24 30 28  ' Calcium 8.7 - 10.2 mg/dL 9.1 9.0 8.7(L)  Total Protein 6.0 - 8.5 g/dL 6.4 - -  Total Bilirubin 0.0 - 1.2 mg/dL 0.2 - -  Alkaline Phos 39 - 117 IU/L 78 - -    AST 0 - 40 IU/L 17 - -  ALT 0 - 32 IU/L 19 - -  Seen in presence of Trisha Williams CMA      Carmell Austria, MD 01/24/2019, 8:45 AM  Cc: Lynnell Jude, MD

## 2019-01-24 NOTE — Patient Instructions (Signed)
If you are age 46 or older, your body mass index should be between 23-30. Your Body mass index is 37.24 kg/m. If this is out of the aforementioned range listed, please consider follow up with your Primary Care Provider.  If you are age 62 or younger, your body mass index should be between 19-25. Your Body mass index is 37.24 kg/m. If this is out of the aformentioned range listed, please consider follow up with your Primary Care Provider.   We have given you samples of the following medication to take: Resotra once daily.   Please purchase the following medications over the counter and take as directed: Miralax twice daily Benefiber once daily.   Please have your labs drawn at [facility].   Thank you,  Dr. Lynann Bologna

## 2019-07-02 ENCOUNTER — Other Ambulatory Visit: Payer: Self-pay | Admitting: Obstetrics and Gynecology

## 2019-07-02 DIAGNOSIS — Z1231 Encounter for screening mammogram for malignant neoplasm of breast: Secondary | ICD-10-CM

## 2019-08-07 ENCOUNTER — Ambulatory Visit
Admission: RE | Admit: 2019-08-07 | Discharge: 2019-08-07 | Disposition: A | Discharge status: Home / Self Care | Payer: 59 | Source: Ambulatory Visit | Attending: Obstetrics and Gynecology | Admitting: Obstetrics and Gynecology

## 2019-08-07 DIAGNOSIS — Z1231 Encounter for screening mammogram for malignant neoplasm of breast: Secondary | ICD-10-CM | POA: Insufficient documentation

## 2019-08-09 ENCOUNTER — Other Ambulatory Visit: Payer: Self-pay | Admitting: Obstetrics and Gynecology

## 2019-08-09 DIAGNOSIS — R928 Other abnormal and inconclusive findings on diagnostic imaging of breast: Secondary | ICD-10-CM

## 2019-08-09 DIAGNOSIS — N6489 Other specified disorders of breast: Secondary | ICD-10-CM

## 2019-08-13 ENCOUNTER — Telehealth: Payer: Self-pay

## 2019-08-13 NOTE — Telephone Encounter (Signed)
Spoke with pt re: Birads 0 mammo bilat. Has addl images/u/s sched 08/17/19

## 2019-08-13 NOTE — Telephone Encounter (Signed)
Pt states she got a letter in the mail that told her she had some abnormalties on her mammogram and would like someone to look at that report and talk to her about what is going on and what this means. Please advise, Thank you

## 2019-08-17 ENCOUNTER — Ambulatory Visit
Admission: RE | Admit: 2019-08-17 | Discharge: 2019-08-17 | Disposition: A | Discharge status: Home / Self Care | Payer: 59 | Source: Ambulatory Visit | Attending: Obstetrics and Gynecology | Admitting: Obstetrics and Gynecology

## 2019-08-17 DIAGNOSIS — R928 Other abnormal and inconclusive findings on diagnostic imaging of breast: Secondary | ICD-10-CM

## 2019-08-17 DIAGNOSIS — N6489 Other specified disorders of breast: Secondary | ICD-10-CM

## 2019-08-19 ENCOUNTER — Encounter: Payer: Self-pay | Admitting: Obstetrics and Gynecology

## 2019-12-14 ENCOUNTER — Other Ambulatory Visit: Payer: Self-pay

## 2019-12-14 ENCOUNTER — Telehealth: Payer: Self-pay | Admitting: Emergency Medicine

## 2019-12-14 ENCOUNTER — Ambulatory Visit
Admission: EM | Admit: 2019-12-14 | Discharge: 2019-12-14 | Disposition: A | Discharge status: Home / Self Care | Payer: Managed Care, Other (non HMO) | Attending: Family Medicine | Admitting: Family Medicine

## 2019-12-14 ENCOUNTER — Encounter: Payer: Self-pay | Admitting: Emergency Medicine

## 2019-12-14 DIAGNOSIS — Z7189 Other specified counseling: Secondary | ICD-10-CM

## 2019-12-14 DIAGNOSIS — Z20822 Contact with and (suspected) exposure to covid-19: Secondary | ICD-10-CM | POA: Diagnosis not present

## 2019-12-14 LAB — INFLUENZA PANEL BY PCR (TYPE A & B)
Influenza A By PCR: NEGATIVE
Influenza B By PCR: NEGATIVE

## 2019-12-14 LAB — GROUP A STREP BY PCR: Group A Strep by PCR: NOT DETECTED

## 2019-12-14 MED ORDER — PREDNISONE 20 MG PO TABS: 40.0000 mg | ORAL_TABLET | Freq: Every day | ORAL | 0 refills | Status: DC

## 2019-12-14 MED ORDER — HYDROCOD POLST-CPM POLST ER 10-8 MG/5ML PO SUER: 5.0000 mL | Freq: Two times a day (BID) | ORAL | 0 refills | Status: DC | PRN

## 2019-12-14 MED ORDER — ALBUTEROL SULFATE HFA 108 (90 BASE) MCG/ACT IN AERS: 2.0000 | INHALATION_SPRAY | RESPIRATORY_TRACT | 0 refills | Status: DC | PRN

## 2019-12-14 NOTE — Telephone Encounter (Signed)
Patient's name and DOB was verified by patient.  Patient was notified that her flu test was Negative.  Patient verbalized understanding.

## 2019-12-14 NOTE — Discharge Instructions (Signed)
It was very nice seeing you today in clinic. Thank you for entrusting me with your care.   Rest and stay HYDRATED. Water and electrolyte containing beverages (Gatorade, Pedialyte) are best to prevent dehydration and electrolyte abnormalities.  Recommend warm salt water gargles, hard candies/lozenges, and hot tea with honey/lemon to help soothe the throat and reduce irritation.  May use Tylenol and/or Ibuprofen as needed for pain/fever.  Please utilize the medications that we discussed. Your prescriptions has been called in to your pharmacy.   You were tested for SARS-CoV-2 (novel coronavirus) today. Testing is performed by an outside lab ([facility]) and has variable turn around times ranging between 2-5 days. Current recommendations from the the CDC and [place] DHHS require that you remain out of work in order to quarantine at home until negative test results are have been received. In the event that your test results are positive, you will be contacted with further directives. These measures are being implemented out of an abundance of caution to prevent transmission and spread during the current SARS-CoV-2 pandemic.  Make arrangements to follow up with your regular doctor in 1 week for re-evaluation if not improving. If your symptoms/condition worsens, please seek follow up care either here or in the ER. Please remember, our [facility] providers are "right here with you" when you need [place].   Again, it was my pleasure to take care of you today. Thank you for choosing our clinic. I hope that you start to feel better quickly.   Quentin Mulling, MSN, APRN, FNP-C, CEN Advanced Practice Provider [facility]

## 2019-12-14 NOTE — ED Triage Notes (Signed)
Patient c/o cough, runny nose, nasal congestion, sore throat, and fatigue and chest tightness that started Wed.  Patient states that she has not taken her temperature.

## 2019-12-15 NOTE — ED Provider Notes (Signed)
[place], [place]   Name: Christina Potts DOB: 10-12-1971 MRN: [id number] CSN: [id number] PCP: Lynnell Jude, MD  Arrival date and time:  12/14/19 1750  Chief Complaint:  Cough, Sore Throat, and Nasal Congestion   NOTE: Prior to seeing the patient today, I have reviewed the triage nursing documentation and vital signs. Clinical staff has updated patient's PMH/PSHx, current medication list, and drug allergies/intolerances to ensure comprehensive history available to assist in medical decision making.   History:   HPI: Christina Potts is a 49 y.o. female who presents today with complaints of sore throat and fatigue that started approximately 2 days ago. Patient woke up today with acute worsening of her symptoms. She complains of diffuse myalgias, paranasal sinus tenderness, congestion, cough, and BILATERAL ear pain. Patient reports subjective fevers; Tmax unknown. Cough has been productive. She complains of her chest being tight. PMH (+) asthma.  She denies that she has experienced any nausea, vomiting, diarrhea, or abdominal pain. She is eating and drinking well. Patient denies any perceived alterations to her sense of taste or smell. Patient presents out of concerns for her personal health as she is employed in a [employer]. Patient denies being in close contact with anyone known to be ill; no one else is her home has experienced a similar symptom constellation. She has not been tested for SARS-CoV-2 (novel coronavirus) in the past 14 days; last tested negative in October 2020 per her report. Patient has not been vaccinated for influenza this season. Despite her symptoms, patient has not taken any over the counter interventions to help improve/relieve her reported symptoms at home.   Past Medical History:  Diagnosis Date  . ADD (attention deficit disorder)   . Anemia    as child  . Anxiety   . Arthritis   . Asthma   . Bowel obstruction ([place])   . BRCA negative 01/2013, 9/19    BRCA/Colaris neg; MyRisk neg 9/19  . Chronic headaches   . Colon polyps    2010-2011  . DDD (degenerative disc disease), cervical   . Depression   . Diverticulitis   . Diverticulosis   . Family history of breast cancer 07/2018   IBIS=12.4%  . GERD (gastroesophageal reflux disease)   . History of snoring   . Hypertension   . IBS (irritable bowel syndrome)   . Migraines    had one 2 weeks ago  . Pneumonia    2014  . PONV (postoperative nausea and vomiting)   . Spondylolisthesis of lumbar region     Past Surgical History:  Procedure Laterality Date  . ABDOMINAL HYSTERECTOMY  11/08/2000   TAH RSO, LOA due to pelvic pain/menometrorrhagia. Dr. Ammie Dalton. Partical  . ANTERIOR CERVICAL DECOMP/DISCECTOMY FUSION N/A 05/25/2018   Procedure: ANTERIOR CERVICAL DECOMPRESSION FUSION CERVICAL FOUR-FIVE,CERVICAL FIVE-SIX,CERVICAL SIX-SEVEN.;  Surgeon: Eustace Moore, MD;  Location: [facility];  Service: Neurosurgery;  Laterality: N/A;  right side approach  . BACK SURGERY  08/2017   Lumbar surgery with Dr. Sherley Bounds  . CARPAL TUNNEL RELEASE Left   . CESAREAN SECTION     X 2  . COLONOSCOPY     2009  . ESOPHAGOGASTRODUODENOSCOPY  02/13/2009   Minimal gastritis. Otherwise normal EGD.   Marland Kitchen HAND SURGERY Left   . 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 WRIST SURGERY Left    Cartlige bremoved    Family History  Problem Relation Age of Onset  . Lung cancer Maternal Aunt  58s  . Breast cancer Paternal Aunt   . Prostate cancer Paternal Uncle   . Liver cancer Maternal Grandmother        liver disease  . Diabetes Maternal Grandmother   . Ovarian cancer Other   . Breast cancer Other        5 cousins, 79-50 yrs old  . Ovarian cancer Cousin   . Breast cancer Cousin   . Brain cancer Cousin   . Stomach cancer Cousin   . Aneurysm Cousin   . Colon cancer Cousin   . Irritable bowel syndrome Mother   . Kidney disease Maternal Grandfather   . Diabetes Maternal Grandfather   . Liver  disease Maternal Grandfather   . Diabetes Maternal Aunt     Social History   Tobacco Use  . Smoking status: Never Smoker  . Smokeless tobacco: Never Used  Substance Use Topics  . Alcohol use: Not Currently    Comment: rare  . Drug use: No    Patient Active Problem List   Diagnosis Date Noted  . Hypokalemia 07/26/2018  . Perimenopausal vasomotor symptoms 07/26/2018  . Cervical vertebral fusion 05/25/2018  . S/P lumbar spinal fusion 09/21/2017    Home Medications:    Current Meds  Medication Sig  . buPROPion (WELLBUTRIN XL) 300 MG 24 hr tablet Take 300 mg by mouth daily.  Marland Kitchen losartan-hydrochlorothiazide (HYZAAR) 50-12.5 MG per tablet Take 1 tablet by mouth at bedtime.   . methocarbamol (ROBAXIN) 500 MG tablet Take 1 tablet (500 mg total) by mouth 3 (three) times daily as needed.  . methylphenidate (RITALIN) 10 MG tablet Take 20 mg by mouth 2 (two) times daily.   Marland Kitchen oxyCODONE-acetaminophen (PERCOCET/ROXICET) 5-325 MG tablet Take 1-2 tablets by mouth every 6 (six) hours as needed (FOR PAIN.).  Marland Kitchen SUMAtriptan (IMITREX) 100 MG tablet Take 100 mg by mouth every 2 (two) hours as needed for migraine or headache.     Allergies:   Dilaudid [hydromorphone hcl], Strawberry (diagnostic), and Morphine and related  Review of Systems (ROS): Review of Systems  Constitutional: Positive for fatigue and fever.  HENT: Positive for congestion, ear pain, sinus pressure and sore throat. Negative for postnasal drip, rhinorrhea, sinus pain and sneezing.   Eyes: Negative for pain, discharge and redness.  Respiratory: Positive for cough and chest tightness. Negative for shortness of breath.   Cardiovascular: Negative for chest pain and palpitations.  Gastrointestinal: Negative for abdominal pain, diarrhea, nausea and vomiting.  Musculoskeletal: Positive for myalgias. Negative for arthralgias, back pain and neck pain.  Skin: Negative for color change, pallor and rash.  Neurological: Negative for  dizziness, syncope, weakness and headaches.  Hematological: Negative for adenopathy.     Vital Signs: Today's Vitals   12/14/19 1800 12/14/19 1801 12/14/19 1805 12/14/19 1902  BP:   (!) 141/95   Pulse:   86   Resp:   14   Temp:   98.2 F (36.8 C)   TempSrc:   Oral   SpO2:   98%   Weight:  185 lb (83.9 kg)    Height:  '4\' 11"'$  (1.499 m)    PainSc: 7    7     Physical Exam: Physical Exam  Constitutional: She is oriented to person, place, and time and well-developed, well-nourished, and in no distress.  Acutely ill appearing; fatigued/listless.  HENT:  Head: Normocephalic and atraumatic.  Right Ear: Tympanic membrane is injected. Tympanic membrane is not bulging.  Left Ear: Tympanic membrane is injected. Tympanic membrane  is not bulging.  Nose: Mucosal edema, rhinorrhea and sinus tenderness present.  Mouth/Throat: Uvula is midline and mucous membranes are normal. Posterior oropharyngeal erythema present. No oropharyngeal exudate or posterior oropharyngeal edema.  Eyes: Pupils are equal, round, and reactive to light.  Neck: No tracheal deviation present.  Cardiovascular: Normal rate, regular rhythm, normal heart sounds and intact distal pulses.  Pulmonary/Chest: Effort normal. She has wheezes (scattered expitatory).  Mild cough noted in clinic. No SOB or increased WOB. No distress. Able to speak in complete sentences without difficulties. SPO2 98% on RA.  Musculoskeletal:        General: Normal range of motion.     Cervical back: Normal range of motion and neck supple.  Neurological: She is alert and oriented to person, place, and time. Gait normal.  Skin: Skin is warm and dry. No rash noted. She is not diaphoretic.  Psychiatric: Mood, memory, affect and judgment normal.  Nursing note and vitals reviewed.   Urgent Care Treatments / Results:   Orders Placed This Encounter  Procedures  . Group A Strep by PCR  . Novel Coronavirus, NAA (Hosp order, Send-out to Ref Lab; TAT 18-24  hrs  . Influenza panel by PCR (type A & B)    LABS: PLEASE NOTE: all labs that were ordered this encounter are listed, however only abnormal results are displayed. Labs Reviewed  GROUP A STREP BY PCR  NOVEL CORONAVIRUS, NAA (HOSP ORDER, SEND-OUT TO REF LAB; TAT 18-24 HRS)  INFLUENZA PANEL BY PCR (TYPE A & B)    EKG: -None  RADIOLOGY: No results found.  PROCEDURES: Procedures  MEDICATIONS RECEIVED THIS VISIT: Medications - No data to display  PERTINENT CLINICAL COURSE NOTES/UPDATES:   Initial Impression / Assessment and Plan / Urgent Care Course:  Pertinent labs & imaging results that were available during my care of the patient were personally reviewed by me and considered in my medical decision making (see lab/imaging section of note for values and interpretations).  Christina Potts is a 49 y.o. female who presents to [facility] today with complaints of Cough, Sore Throat, and Nasal Congestion  Patient acutely ill appearing (non-toxic) today in clinic. She does not appear to be in any acute distress. Presenting symptoms (see HPI) and exam as documented above. She presents with symptoms associated with SARS-CoV-2 (novel coronavirus). Discussed typical symptom constellation. Reviewed potential for infection and need for testing. Patient amenable to being tested. SARS-CoV-2 swab collected by certified clinical staff. Discussed variable turn around times associated with testing, as swabs are being processed at [facility], and have been taking between 24-48 hours to come back. She was advised to self quarantine, per [facility] DHHS guidelines, until negative results received. These measures are being implemented out of an abundance of caution to prevent transmission and spread during the current SARS-CoV-2 pandemic.  PCR streptococcal throat swab (-). PCR influenza testing resulted (-) for both the influenza A and B Ag. Presenting symptoms consistent with suspected SARS-CoV-2. Her  testing is pending at this time. I discussed with her that her symptoms are felt to be viral in nature, thus antibiotics would not offer her any relief or improve his symptoms any faster than conservative symptomatic management. Discussed supportive care measures at home during acute phase of illness. Patient to rest as much as possible. She was encouraged to ensure adequate hydration (water and ORS) to prevent dehydration and electrolyte derangements. Patient may use APAP and/or IBU on an as needed basis for pain/fever. Will send  in an albuterol inhaler and a steroid burst to help with patient wheezing and chest tightness. Additionally, will send in a short term supply of Tussionex for PRN use for her cough; indications and side effects reviewed.   Discussed follow up with primary care physician in 1 week for re-evaluation. I have reviewed the follow up and strict return precautions for any new or worsening symptoms. Patient is aware of symptoms that would be deemed urgent/emergent, and would thus require further evaluation either here or in the emergency department. At the time of discharge, she verbalized understanding and consent with the discharge plan as it was reviewed with her. All questions were fielded by provider and/or clinic staff prior to patient discharge.    Final Clinical Impressions / Urgent Care Diagnoses:   Final diagnoses:  Suspected COVID-19 virus infection  Encounter for laboratory testing for COVID-19 virus  Advice given about COVID-19 virus infection    New Prescriptions:  [place] Controlled Substance Registry consulted? Yes, I have consulted the [place] Controlled Substances Registry for this patient, and feel the risk/benefit ratio today is favorable for proceeding with this prescription for a controlled substance.  . Discussed use of controlled substance medication to treat her acute symptoms.  o Reviewed [place] STOP Act regulations  o Clinic does not refill controlled substances over  the phone without face to face evaluation.  . Safety precautions reviewed.  o Medications should not be sold or taken with alcohol.  o Avoid use while working, driving, or operating heavy machinery.  o Side effects associated with the use of this particular medication reviewed. - Patient understands that this medication can cause CNS depression, increase her risk of falls, and even lead to overdose that may result in death, if used outside of the parameters that she and I discussed.  With all of this in mind, she knowingly accepts the risks and responsibilities associated with intended course of treatment, and elects to responsibly proceed as discussed.  Meds ordered this encounter  Medications  . predniSONE (DELTASONE) 20 MG tablet    Sig: Take 2 tablets (40 mg total) by mouth daily.    Dispense:  10 tablet    Refill:  0  . albuterol (VENTOLIN HFA) 108 (90 Base) MCG/ACT inhaler    Sig: Inhale 2 puffs into the lungs every 4 (four) hours as needed for wheezing or shortness of breath.    Dispense:  18 g    Refill:  0  . chlorpheniramine-HYDROcodone (TUSSIONEX PENNKINETIC ER) 10-8 MG/5ML SUER    Sig: Take 5 mLs by mouth every 12 (twelve) hours as needed for cough.    Dispense:  50 mL    Refill:  0    Recommended Follow up Care:  Patient encouraged to follow up with the following provider within the specified time frame, or sooner as dictated by the severity of her symptoms. As always, she was instructed that for any urgent/emergent care needs, she should seek care either here or in the emergency department for more immediate evaluation.  Follow-up Information    Lynnell Jude, MD In 1 week.   Specialty: Family Medicine Why: General reassessment of symptoms if not improving Contact information: [place] [address] [phone]         NOTE: This note was prepared using Dragon dictation software along with smaller phrase technology. Despite my best ability to  proofread, there is the potential that transcriptional errors may still occur from this process, and are completely unintentional.  Karen Kitchens, NP 12/15/19 2235

## 2019-12-16 LAB — NOVEL CORONAVIRUS, NAA (HOSP ORDER, SEND-OUT TO REF LAB; TAT 18-24 HRS): SARS-CoV-2, NAA: NOT DETECTED

## 2019-12-22 ENCOUNTER — Other Ambulatory Visit: Payer: Self-pay | Admitting: Neurological Surgery

## 2019-12-22 DIAGNOSIS — S129XXS Fracture of neck, unspecified, sequela: Secondary | ICD-10-CM

## 2019-12-31 ENCOUNTER — Ambulatory Visit
Admission: RE | Admit: 2019-12-31 | Discharge: 2019-12-31 | Disposition: A | Discharge status: Home / Self Care | Payer: Managed Care, Other (non HMO) | Source: Ambulatory Visit | Attending: Neurological Surgery | Admitting: Neurological Surgery

## 2019-12-31 ENCOUNTER — Other Ambulatory Visit: Payer: Self-pay

## 2019-12-31 DIAGNOSIS — S129XXS Fracture of neck, unspecified, sequela: Secondary | ICD-10-CM

## 2020-02-20 ENCOUNTER — Other Ambulatory Visit: Payer: Self-pay

## 2020-02-20 ENCOUNTER — Encounter: Payer: Self-pay | Admitting: Obstetrics and Gynecology

## 2020-02-20 ENCOUNTER — Ambulatory Visit (INDEPENDENT_AMBULATORY_CARE_PROVIDER_SITE_OTHER): Payer: 59 | Admitting: Obstetrics and Gynecology

## 2020-02-20 VITALS — BP 110/90 | Ht 59.0 in | Wt 193.0 lb

## 2020-02-20 DIAGNOSIS — Z90710 Acquired absence of both cervix and uterus: Secondary | ICD-10-CM | POA: Diagnosis not present

## 2020-02-20 DIAGNOSIS — N898 Other specified noninflammatory disorders of vagina: Secondary | ICD-10-CM | POA: Diagnosis not present

## 2020-02-20 DIAGNOSIS — R300 Dysuria: Secondary | ICD-10-CM | POA: Diagnosis not present

## 2020-02-20 MED ORDER — CLOTRIMAZOLE-BETAMETHASONE 1-0.05 % EX CREA: TOPICAL_CREAM | CUTANEOUS | 0 refills | Status: DC

## 2020-02-20 NOTE — Progress Notes (Signed)
Lynnell Jude, MD   Chief Complaint  Patient presents with  . Urinary Tract Infection    burning when urinating, some abdominal pain last thursday, no blood in urine/frequency since last week  . Vaginal Itching    no discharge or odor since last week    HPI:      [name] is a 49 y.o. [id number] who LMP was No LMP recorded. Patient has had a hysterectomy., presents today for dysuria, vaginal itching externally, without increased d/c or odor for about 2 wks. Treated with monistat-3 and diflucan wihtout sx relief. Changed to scented soap before sx started, uses dryer sheets and sensitive skin wipes but they seem more scented to her than usual. Has urinary frequency with good flow due to water intake. No urgency, LBP. She had 1 episode abd pain last wk that resolved. She is not recently sex active.  S/p hyst for leio/DUB. Has 1 ovary remaining. Hx of diverticulitis  Past Medical History:  Diagnosis Date  . ADD (attention deficit disorder)   . Anemia    as child  . Anxiety   . Arthritis   . Asthma   . Bowel obstruction ([place])   . BRCA negative 01/2013, 9/19   BRCA/Colaris neg; MyRisk neg 9/19  . Chronic headaches   . Colon polyps    2010-2011  . DDD (degenerative disc disease), cervical   . Depression   . Diverticulitis   . Diverticulosis   . Family history of breast cancer 07/2018   IBIS=12.4%  . GERD (gastroesophageal reflux disease)   . History of snoring   . Hypertension   . IBS (irritable bowel syndrome)   . Migraines    had one 2 weeks ago  . Pneumonia    2014  . PONV (postoperative nausea and vomiting)   . Spondylolisthesis of lumbar region     Past Surgical History:  Procedure Laterality Date  . ABDOMINAL HYSTERECTOMY  11/08/2000   TAH RSO, LOA due to pelvic pain/menometrorrhagia. Dr. Ammie Dalton. Partical  . ANTERIOR CERVICAL DECOMP/DISCECTOMY FUSION N/A 05/25/2018   Procedure: ANTERIOR CERVICAL DECOMPRESSION FUSION CERVICAL  FOUR-FIVE,CERVICAL FIVE-SIX,CERVICAL SIX-SEVEN.;  Surgeon: Eustace Moore, MD;  Location: [place] [facility];  Service: Neurosurgery;  Laterality: N/A;  right side approach  . BACK SURGERY  08/2017   Lumbar surgery with Dr. Sherley Bounds  . CARPAL TUNNEL RELEASE Left   . CESAREAN SECTION     X 2  . COLONOSCOPY     2009  . ESOPHAGOGASTRODUODENOSCOPY  02/13/2009   Minimal gastritis. Otherwise normal EGD.   Marland Kitchen HAND SURGERY Left   . 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 WRIST SURGERY Left    Cartlige bremoved    Family History  Problem Relation Age of Onset  . Lung cancer Maternal Aunt        59s  . Breast cancer Paternal Aunt   . Prostate cancer Paternal Uncle   . Liver cancer Maternal Grandmother        liver disease  . Diabetes Maternal Grandmother   . Ovarian cancer Other   . Breast cancer Other        5 cousins, 35-50 yrs old  . Ovarian cancer Cousin   . Breast cancer Cousin   . Brain cancer Cousin   . Stomach cancer Cousin   . Aneurysm Cousin   . Colon cancer Cousin   . Irritable bowel syndrome Mother   . Kidney disease  Maternal Grandfather   . Diabetes Maternal Grandfather   . Liver disease Maternal Grandfather   . Diabetes Maternal Aunt   . Lung cancer Maternal Aunt     Social History   Socioeconomic History  . Marital status: Divorced    Spouse name: Not on file  . Number of children: 2  . Years of education: Not on file  . Highest education level: Not on file  Occupational History  . Occupation: medical lab  Tobacco Use  . Smoking status: Never Smoker  . Smokeless tobacco: Never Used  Substance and Sexual Activity  . Alcohol use: Not Currently    Comment: rare  . Drug use: No  . Sexual activity: Not Currently    Birth control/protection: Surgical    Comment: Hysterectomy  Other Topics Concern  . Not on file  Social History Narrative   ** Merged History Encounter **       Social Determinants of Health   Financial Resource Strain:   . Difficulty of  Paying Living Expenses:   Food Insecurity:   . Worried About Charity fundraiser in the Last Year:   . Arboriculturist in the Last Year:   Transportation Needs:   . Film/video editor (Medical):   Marland Kitchen Lack of Transportation (Non-Medical):   Physical Activity:   . Days of Exercise per Week:   . Minutes of Exercise per Session:   Stress:   . Feeling of Stress :   Social Connections:   . Frequency of Communication with Friends and Family:   . Frequency of Social Gatherings with Friends and Family:   . Attends Religious Services:   . Active Member of Clubs or Organizations:   . Attends Archivist Meetings:   Marland Kitchen Marital Status:   Intimate Partner Violence:   . Fear of Current or Ex-Partner:   . Emotionally Abused:   Marland Kitchen Physically Abused:   . Sexually Abused:     Outpatient Medications Prior to Visit  Medication Sig Dispense Refill  . albuterol (VENTOLIN HFA) 108 (90 Base) MCG/ACT inhaler Inhale 2 puffs into the lungs every 4 (four) hours as needed for wheezing or shortness of breath. 18 g 0  . buPROPion (WELLBUTRIN XL) 300 MG 24 hr tablet Take 300 mg by mouth daily.    . celecoxib (CELEBREX) 200 MG capsule Take 200 mg by mouth 2 (two) times daily as needed.    Marland Kitchen losartan-hydrochlorothiazide (HYZAAR) 50-12.5 MG per tablet Take 1 tablet by mouth at bedtime.     . methocarbamol (ROBAXIN) 500 MG tablet Take 1 tablet (500 mg total) by mouth 3 (three) times daily as needed. 60 tablet 1  . methylphenidate (RITALIN) 10 MG tablet Take 20 mg by mouth 2 (two) times daily.     . mometasone (ELOCON) 0.1 % cream Apply 1 application topically daily as needed (excema).     . ondansetron (ZOFRAN-ODT) 4 MG disintegrating tablet Take 4 mg by mouth every 8 (eight) hours as needed for nausea or vomiting.     Marland Kitchen oxyCODONE-acetaminophen (PERCOCET/ROXICET) 5-325 MG tablet Take 1-2 tablets by mouth every 6 (six) hours as needed (FOR PAIN.). 50 tablet 0  . phentermine (ADIPEX-P) 37.5 MG tablet Take  18.75 mg by mouth daily.    . potassium chloride (KLOR-CON) 10 MEQ tablet Take by mouth.    . SUMAtriptan (IMITREX) 100 MG tablet Take 100 mg by mouth every 2 (two) hours as needed for migraine or headache.   2  .  topiramate (TOPAMAX) 25 MG tablet Take 25 mg by mouth daily.    . fluconazole (DIFLUCAN) 150 MG tablet Take 150 mg by mouth once a week.    . chlorpheniramine-HYDROcodone (TUSSIONEX PENNKINETIC ER) 10-8 MG/5ML SUER Take 5 mLs by mouth every 12 (twelve) hours as needed for cough. 50 mL 0  . predniSONE (DELTASONE) 20 MG tablet Take 2 tablets (40 mg total) by mouth daily. 10 tablet 0   No facility-administered medications prior to visit.      ROS:  Review of Systems  Constitutional: Negative for fever.  Gastrointestinal: Negative for blood in stool, constipation, diarrhea, nausea and vomiting.  Genitourinary: Positive for dysuria and frequency. Negative for dyspareunia, flank pain, hematuria, urgency, vaginal bleeding, vaginal discharge and vaginal pain.  Musculoskeletal: Negative for back pain.  Skin: Negative for rash.  BREAST: No symptoms   OBJECTIVE:   Vitals:  BP 110/90   Ht '4\' 11"'  (1.499 m)   Wt 193 lb (87.5 kg)   BMI 38.98 kg/m   Physical Exam Vitals reviewed.  Constitutional:      Appearance: She is well-developed.  Pulmonary:     Effort: Pulmonary effort is normal.  Genitourinary:    General: Normal vulva.     Pubic Area: No rash.      Labia:        Right: No rash, tenderness or lesion.        Left: No rash, tenderness or lesion.      Vagina: Normal. No vaginal discharge, erythema or tenderness.     Uterus: Absent. Not tender.      Adnexa:        Right: No mass or tenderness.         Left: No mass or tenderness.    Musculoskeletal:        General: Normal range of motion.     Cervical back: Normal range of motion.  Skin:    General: Skin is warm and dry.  Neurological:     General: No focal deficit present.     Mental Status: She is alert and  oriented to person, place, and time.  Psychiatric:        Mood and Affect: Mood normal.        Behavior: Behavior normal.        Thought Content: Thought content normal.        Judgment: Judgment normal.     Results: Results for orders placed or performed in visit on 02/20/20 (from the past 24 hour(s))  POCT Wet Prep with KOH     Status: Normal   Collection Time: 02/21/20 10:01 AM  Result Value Ref Range   Trichomonas, UA Negative    Clue Cells Wet Prep HPF POC neg    Epithelial Wet Prep HPF POC     Yeast Wet Prep HPF POC neg    Bacteria Wet Prep HPF POC     RBC Wet Prep HPF POC     WBC Wet Prep HPF POC     KOH Prep POC Negative Negative  POCT Urinalysis Dipstick     Status: Normal   Collection Time: 02/21/20 10:01 AM  Result Value Ref Range   Color, UA yellow    Clarity, UA clear    Glucose, UA Negative Negative   Bilirubin, UA neg    Ketones, UA neg    Spec Grav, UA 1.015 1.010 - 1.025   Blood, UA neg    pH, UA 5.0 5.0 - 8.0  Protein, UA Negative Negative   Urobilinogen, UA     Nitrite, UA neg    Leukocytes, UA Negative Negative   Appearance     Odor       Assessment/Plan: Vaginal itching - Plan: POCT Wet Prep with KOH, clotrimazole-betamethasone (LOTRISONE) cream; Neg exam/wet prep. Already treated for yeast. Question fungal vs chem. Rx lotrisone crm prn sx. Dove sens skin soap, line dry underwear, d/c wipes. F/u prn.   Dysuria - Plan: POCT Urinalysis Dipstick; Neg UA. Most likely related to vaginitis. F/u prn.   Meds ordered this encounter  Medications  . clotrimazole-betamethasone (LOTRISONE) cream    Sig: Apply externally BID prn sx up to 2 wks    Dispense:  15 g    Refill:  0    Order Specific Question:   Supervising Provider    Answer:   Gae Dry [[id number]]      Return if symptoms worsen or fail to improve.  Paytin Ramakrishnan B. Linet Brash, PA-C 02/21/2020 10:03 AM

## 2020-02-21 ENCOUNTER — Encounter: Payer: Self-pay | Admitting: Obstetrics and Gynecology

## 2020-02-21 LAB — POCT URINALYSIS DIPSTICK
Bilirubin, UA: NEGATIVE
Blood, UA: NEGATIVE
Glucose, UA: NEGATIVE
Ketones, UA: NEGATIVE
Leukocytes, UA: NEGATIVE
Nitrite, UA: NEGATIVE
Protein, UA: NEGATIVE
Spec Grav, UA: 1.015 (ref 1.010–1.025)
pH, UA: 5 (ref 5.0–8.0)

## 2020-02-21 LAB — POCT WET PREP WITH KOH
Clue Cells Wet Prep HPF POC: NEGATIVE
KOH Prep POC: NEGATIVE
Trichomonas, UA: NEGATIVE
Yeast Wet Prep HPF POC: NEGATIVE

## 2020-02-21 NOTE — Patient Instructions (Signed)
I value your feedback and entrusting us with your care. If you get a [facility] patient survey, I would appreciate you taking the time to let us know about your experience today. Thank you!  As of November 08, 2019, your lab results will be released to your MyChart immediately, before I even have a chance to see them. Please give me time to review them and contact you if there are any abnormalities. Thank you for your patience.  

## 2020-09-06 ENCOUNTER — Other Ambulatory Visit: Payer: Self-pay | Admitting: Obstetrics and Gynecology

## 2020-09-06 DIAGNOSIS — N898 Other specified noninflammatory disorders of vagina: Secondary | ICD-10-CM

## 2021-03-06 ENCOUNTER — Telehealth: Payer: Self-pay

## 2021-03-06 NOTE — Telephone Encounter (Signed)
Called pt, no answer, mailbox is full.

## 2021-03-06 NOTE — Telephone Encounter (Signed)
Pt needs annual first. [facility] won't do mammo without up to date breast exam by provider.

## 2021-03-06 NOTE — Telephone Encounter (Signed)
Patient requesting ABC to place order for mammogram. Cb#[phone]

## 2021-03-06 NOTE — Telephone Encounter (Signed)
Pt aware, transferred to [place] to schedule annual.

## 2021-03-11 ENCOUNTER — Encounter: Payer: Self-pay | Admitting: Gastroenterology

## 2021-03-11 ENCOUNTER — Ambulatory Visit (INDEPENDENT_AMBULATORY_CARE_PROVIDER_SITE_OTHER): Payer: 59 | Admitting: Gastroenterology

## 2021-03-11 ENCOUNTER — Other Ambulatory Visit: Payer: Self-pay

## 2021-03-11 ENCOUNTER — Other Ambulatory Visit (INDEPENDENT_AMBULATORY_CARE_PROVIDER_SITE_OTHER): Payer: 59

## 2021-03-11 VITALS — BP 120/96 | HR 111 | Ht 59.0 in | Wt 209.2 lb

## 2021-03-11 DIAGNOSIS — Z86010 Personal history of colonic polyps: Secondary | ICD-10-CM

## 2021-03-11 DIAGNOSIS — K219 Gastro-esophageal reflux disease without esophagitis: Secondary | ICD-10-CM | POA: Diagnosis not present

## 2021-03-11 DIAGNOSIS — R1013 Epigastric pain: Secondary | ICD-10-CM

## 2021-03-11 DIAGNOSIS — K581 Irritable bowel syndrome with constipation: Secondary | ICD-10-CM | POA: Diagnosis not present

## 2021-03-11 DIAGNOSIS — K5732 Diverticulitis of large intestine without perforation or abscess without bleeding: Secondary | ICD-10-CM | POA: Diagnosis not present

## 2021-03-11 MED ORDER — DEXLANSOPRAZOLE 60 MG PO CPDR: 60.0000 mg | DELAYED_RELEASE_CAPSULE | Freq: Every day | ORAL | 11 refills | Status: DC

## 2021-03-11 NOTE — Patient Instructions (Addendum)
If you are age 48 or older, your body mass index should be between 23-30. Your Body mass index is 42.26 kg/m. If this is out of the aforementioned range listed, please consider follow up with your Primary Care Provider.  If you are age 52 or younger, your body mass index should be between 19-25. Your Body mass index is 42.26 kg/m. If this is out of the aformentioned range listed, please consider follow up with your Primary Care Provider.   Please go to the lab on the 2nd floor [address] before you leave the office today.   You have been scheduled [facility] CT [facility] [place] [address] [address] [facility]   You are scheduled on 03/26/2021   at 1030am     . You should arrive 15 minutes prior to your appointment time for registration. Please follow the written instructions below on the day of your exam:  WARNING: IF YOU ARE ALLERGIC TO IODINE/X-RAY DYE, PLEASE NOTIFY RADIOLOGY IMMEDIATELY AT [phone]! YOU WILL BE GIVEN A 13 HOUR PREMEDICATION PREP.  1) Do not eat or drink anything after 630am (4 hours prior to your test) 2) You have been given 2 bottles of oral contrast to drink. The solution may taste better if refrigerated, but do NOT add ice or any other liquid to this solution. Shake well before drinking.    Drink 1 bottle of contrast @ 830am (2 hours prior to your exam)  Drink 1 bottle of contrast @ 930am (1 hour prior to your exam)  You may take any medications as prescribed with a small amount of water, if necessary. If you take any of the following medications: METFORMIN, GLUCOPHAGE, GLUCOVANCE, AVANDAMET, RIOMET, FORTAMET, [place] MET, JANUMET, GLUMETZA or METAGLIP, you MAY be asked to HOLD this medication 48 hours AFTER the exam.  The purpose of you drinking the oral contrast is to aid in the visualization of your intestinal tract. The contrast solution may cause some diarrhea. Depending on your individual set  of symptoms, you may also receive an intravenous injection of x-ray contrast/dye. Plan on being at [facility] for 30 minutes or longer, depending on the type of exam you are having performed.  This test typically takes 30-45 minutes to complete.  If you have any questions regarding your exam or if you need to reschedule, you may call the CT department at [phone] between the hours of 8:00 am and 5:00 pm, Monday-Friday.  ________________________________________________________________________  Merril Abbe daily 17g  No NSAID's  Dexilant has been called in as well  Thank you,  Dr. Jackquline Denmark

## 2021-03-11 NOTE — Progress Notes (Signed)
Chief Complaint: FU  Referring Provider:  Lynnell Jude, MD      ASSESSMENT AND PLAN;   #1.  Recurrent descending colon diverticulitis  -Adm 11/30-12/03/2010 to [facility] with 2.4 cm div abscess, Rx with Levaquin/Flagyl. Resolved.  -01/19/2019 on CT at [facility]. Rx with augmentin.  -Neg colon 09/2018 except for pancolonic diverticulosis.  #2. IBS with constipation (with alt diarrhea) #3. GERD #4.  Intermittent rectal bleeding.   Plan: -CBC, CMP, CRP -CT AP with p.o. and IV contrast. -If CT is negative, would recommend repeating colon with 2-day prep (H/O rectal bleeding is concerning) -May need Sx consultation thereafter. -Miralax 17g po QD -No NSAIDs. -Dexilant 60 mg p.o. QD #30, 11 refills.  HPI:    Christina Potts is a 50 y.o. female  Works in the lab Engineer, building services) at [employer]  With over [place] H/O abdo pain with alternating constipation and rare diarrhea.  Diagnosed with IBS.  Has history of recurrent left colonic diverticulitis x intermittently over last 5 to 6 years.  Most recent episode 11/30 - 11/02/2010 with diverticular abscess in descending colon. 2.4 cm. Too small for IR drainage, surgery recommended medical treatment for now and consideration for 1 stage surgery as an outpatient.  She was treated with IV followed by p.o. Levaquin/Flagyl.  Still with occasional left-sided abdominal pain.  No fever.  Has occasional rectal bleeding especially during flares.  Has been advised to get repeat colonoscopy.  No fever or chills.  Does have heartburn, was better when she was taking Dexilant.   On review of previous records  - acute left lower quadrant abdominal pain.  Seen in [facility] emergency room on 01/19/2019.  Underwent CT scan of the abdomen and pelvis which showed descending colon diverticulitis without any abscess.  She was treated with Augmentin 875 p.o. twice daily.  -Adm 10/28/2020- 11/02/2020 Adm to [facility]. Patient with a 6  day history of abdominal pain, nausea, hematochezia, trouble with PO intake, and fever (Tmax 102.5). Long history diverticular disease with multiple flares. On presentation, leukocytosis noted at 14.3. Colonic abscess on CT measuring 2.4cm. No perf or obstruction. Consulted surgery who recommended serial abdominal exams and consult IR given that she does not currently have an acute abdomen. IR consulted, abscess currently too small for percutaneous drainage. Given the size of the abscess as <4cm, continued medical management with Zosyn, IVFs, bowel rest and symptom control. Patient's symptoms continued to improve and her diet was advanced. On day 4 of hospitalization, repeat CT abdomen showed improvement of inflammatory changes and no readily apparent abscess.Pt transitioned to oral Levaquin and metronidazole which she will continue on discharge. Pt will need repeat colonoscopy in 6 - 8 weeks and possible referral to general surgery for recurrent diverticulitis.    Past GI work-up: EGD 09/2018 - Mild Gastritis. Bx neg for HP, neg SB bx for celiac  Colonoscopy 09/2018 -Pancolonic diverticulosis predominantly in the sigmoid colon. -Small internal hemorrhoids (likely etiology of rectal bleeding, no bleeding currently) -Otherwise normal colonoscopy to TI.   Past Medical History:  Diagnosis Date  . ADD (attention deficit disorder)   . Anemia    as child  . Anxiety   . Arthritis   . Asthma   . Bowel obstruction ([place])   . BRCA negative 01/2013, 9/19   BRCA/Colaris neg; MyRisk neg 9/19  . Chronic headaches   . Colon polyps    2010-2011  . DDD (degenerative disc disease), cervical   . Depression   .  Diverticulitis   . Diverticulosis   . Family history of breast cancer 07/2018   IBIS=12.4%  . GERD (gastroesophageal reflux disease)   . History of snoring   . Hypertension   . IBS (irritable bowel syndrome)   . Migraines    had one 2 weeks ago  . Pneumonia    2014  . PONV (postoperative  nausea and vomiting)   . Spondylolisthesis of lumbar region     Past Surgical History:  Procedure Laterality Date  . ABDOMINAL HYSTERECTOMY  11/08/2000   TAH RSO, LOA due to pelvic pain/menometrorrhagia. Dr. Ammie Dalton. Partical  . ANTERIOR CERVICAL DECOMP/DISCECTOMY FUSION N/A 05/25/2018   Procedure: ANTERIOR CERVICAL DECOMPRESSION FUSION CERVICAL FOUR-FIVE,CERVICAL FIVE-SIX,CERVICAL SIX-SEVEN.;  Surgeon: Eustace Moore, MD;  Location: [place];  Service: Neurosurgery;  Laterality: N/A;  right side approach  . BACK SURGERY  08/2017   Lumbar surgery with Dr. Sherley Bounds  . CARPAL TUNNEL RELEASE Left   . CESAREAN SECTION     X 2  . COLONOSCOPY     2009  . ESOPHAGOGASTRODUODENOSCOPY  02/13/2009   Minimal gastritis. Otherwise normal EGD.   Marland Kitchen HAND SURGERY Left   . 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 WRIST SURGERY Left    Cartlige bremoved    Family History  Problem Relation Age of Onset  . Lung cancer Maternal Aunt        18s  . Breast cancer Paternal Aunt   . Prostate cancer Paternal Uncle   . Liver cancer Maternal Grandmother        liver disease  . Diabetes Maternal Grandmother   . Ovarian cancer Other   . Breast cancer Other        5 cousins, 23-50 yrs old  . Ovarian cancer Cousin   . Breast cancer Cousin   . Brain cancer Cousin   . Stomach cancer Cousin   . Aneurysm Cousin   . Colon cancer Cousin   . Irritable bowel syndrome Mother   . Kidney disease Maternal Grandfather   . Diabetes Maternal Grandfather   . Liver disease Maternal Grandfather   . Diabetes Maternal Aunt   . Lung cancer Maternal Aunt     Social History   Tobacco Use  . Smoking status: Never Smoker  . Smokeless tobacco: Never Used  Vaping Use  . Vaping Use: Never used  Substance Use Topics  . Alcohol use: Not Currently    Comment: rare  . Drug use: No    Current Outpatient Medications  Medication Sig Dispense Refill  . albuterol (VENTOLIN HFA) 108 (90 Base) MCG/ACT inhaler Inhale 2  puffs into the lungs every 4 (four) hours as needed for wheezing or shortness of breath. 18 g 0  . buPROPion (WELLBUTRIN XL) 300 MG 24 hr tablet Take 300 mg by mouth daily.    . clotrimazole-betamethasone (LOTRISONE) cream APPLY TO AFFECTED AREA TWICE A DAY AS NEEDED FOR SYMPTOMS FOR UPTO 2 WEEKS 15 g 0  . fluconazole (DIFLUCAN) 150 MG tablet Take 150 mg by mouth once a week.    . losartan-hydrochlorothiazide (HYZAAR) 50-12.5 MG per tablet Take 1 tablet by mouth at bedtime.     . methylphenidate (RITALIN) 10 MG tablet Take 20 mg by mouth 2 (two) times daily.    . mometasone (ELOCON) 0.1 % cream Apply 1 application topically daily as needed (excema).     . ondansetron (ZOFRAN-ODT) 4 MG disintegrating tablet Take 4 mg by mouth every  8 (eight) hours as needed for nausea or vomiting.     Marland Kitchen oxyCODONE-acetaminophen (PERCOCET/ROXICET) 5-325 MG tablet Take 1-2 tablets by mouth every 6 (six) hours as needed (FOR PAIN.). 50 tablet 0   No current facility-administered medications for this visit.    Allergies  Allergen Reactions  . Dilaudid [Hydromorphone Hcl] Anaphylaxis and Shortness Of Breath    Patient reports 09/21/17 she tolerates oxycodone fine  . Strawberry (Diagnostic) Anaphylaxis, Hives and Swelling  . Morphine And Related Rash and Other (See Comments)    Burns my skin     Review of Systems:  neg     Physical Exam:    BP (!) 120/96 (BP Location: Right Arm, Patient Position: Sitting)   Pulse (!) 111   Ht '4\' 11"'  (1.499 m)   Wt 209 lb 4 oz (94.9 kg)   BMI 42.26 kg/m  Filed Weights   03/11/21 0921  Weight: 209 lb 4 oz (94.9 kg)   Constitutional:  Well-developed, in no acute distress. Psychiatric: Normal mood and affect. Behavior is normal. HEENT: Pupils normal.  Conjunctivae are normal. No scleral icterus. Neck supple.  Well-healed surgical scar. Cardiovascular: Normal rate, regular rhythm. No edema Pulmonary/chest: Effort normal and breath sounds normal. No wheezing, rales or  rhonchi. Abdominal: Soft, nondistended.  Minimal lower abdominal tenderness without rebound.  Bowel sounds active throughout. There are no masses palpable. No hepatomegaly. Rectal: To be performed at the time of colonoscopy Neurological: Alert and oriented to person place and time. Skin: Skin is warm and dry. No rashes noted.  Data Reviewed: I have personally reviewed following labs and imaging studies  CBC: CBC Latest Ref Rng & Units 01/24/2019 05/17/2018 09/15/2017  WBC 3.8 - 10.8 Thousand/uL 7.7 9.2 8.6  Hemoglobin 11.7 - 15.5 g/dL 15.5 15.1(H) 14.8  Hematocrit 35.0 - 45.0 % 44.7 46.9(H) 44.7  Platelets 140 - 400 Thousand/uL 365 321 287    CMP: CMP Latest Ref Rng & Units 07/26/2018 05/17/2018 09/15/2017  Glucose 65 - 99 mg/dL 92 85 85  BUN 6 - 24 mg/dL '6 17 12  ' Creatinine 0.57 - 1.00 mg/dL 0.86 0.85 1.05(H)  Sodium 134 - 144 mmol/L 142 138 136  Potassium 3.5 - 5.2 mmol/L 4.1 3.3(L) 3.6  Chloride 96 - 106 mmol/L 101 97(L) 100(L)  CO2 20 - 29 mmol/L '24 30 28  ' Calcium 8.7 - 10.2 mg/dL 9.1 9.0 8.7(L)  Total Protein 6.0 - 8.5 g/dL 6.4 - -  Total Bilirubin 0.0 - 1.2 mg/dL 0.2 - -  Alkaline Phos 39 - 117 IU/L 78 - -  AST 0 - 40 IU/L 17 - -  ALT 0 - 32 IU/L 19 - -        Carmell Austria, MD 03/11/2021, 9:31 AM  Cc: Lynnell Jude, MD

## 2021-03-12 ENCOUNTER — Telehealth: Payer: Self-pay | Admitting: Gastroenterology

## 2021-03-12 LAB — COMPREHENSIVE METABOLIC PANEL
AG Ratio: 1.5 (calc) (ref 1.0–2.5)
ALT: 18 U/L (ref 6–29)
AST: 18 U/L (ref 10–35)
Albumin: 4.1 g/dL (ref 3.6–5.1)
Alkaline phosphatase (APISO): 83 U/L (ref 31–125)
BUN: 17 mg/dL (ref 7–25)
CO2: 27 mmol/L (ref 20–32)
Calcium: 9.3 mg/dL (ref 8.6–10.2)
Chloride: 99 mmol/L (ref 98–110)
Creat: 0.96 mg/dL (ref 0.50–1.10)
Globulin: 2.7 g/dL (calc) (ref 1.9–3.7)
Glucose, Bld: 89 mg/dL (ref 65–99)
Potassium: 4.3 mmol/L (ref 3.5–5.3)
Sodium: 138 mmol/L (ref 135–146)
Total Bilirubin: 0.4 mg/dL (ref 0.2–1.2)
Total Protein: 6.8 g/dL (ref 6.1–8.1)

## 2021-03-12 LAB — CBC WITH DIFFERENTIAL/PLATELET
Absolute Monocytes: 450 cells/uL (ref 200–950)
Basophils Absolute: 27 cells/uL (ref 0–200)
Basophils Relative: 0.3 %
Eosinophils Absolute: 54 cells/uL (ref 15–500)
Eosinophils Relative: 0.6 %
HCT: 47.3 % — ABNORMAL HIGH (ref 35.0–45.0)
Hemoglobin: 15.9 g/dL — ABNORMAL HIGH (ref 11.7–15.5)
Lymphs Abs: 1449 cells/uL (ref 850–3900)
MCH: 31.1 pg (ref 27.0–33.0)
MCHC: 33.6 g/dL (ref 32.0–36.0)
MCV: 92.4 fL (ref 80.0–100.0)
MPV: 10.8 fL (ref 7.5–12.5)
Monocytes Relative: 5 %
Neutro Abs: 7020 cells/uL (ref 1500–7800)
Neutrophils Relative %: 78 %
Platelets: 334 10*3/uL (ref 140–400)
RBC: 5.12 10*6/uL — ABNORMAL HIGH (ref 3.80–5.10)
RDW: 12.1 % (ref 11.0–15.0)
Total Lymphocyte: 16.1 %
WBC: 9 10*3/uL (ref 3.8–10.8)

## 2021-03-12 LAB — HIGH SENSITIVITY CRP: hs-CRP: 5.1 mg/L — ABNORMAL HIGH

## 2021-03-12 NOTE — Telephone Encounter (Signed)
Please call in GI cocktail (equal amounts of Maalox, Bentyl, viscous lidocaine) 5 to 10 cc p.o. every 8 hours as needed #126ml, 2 refills RG

## 2021-03-12 NOTE — Telephone Encounter (Signed)
See note below and advise. 

## 2021-03-12 NOTE — Telephone Encounter (Signed)
Script called to [facility] in [place], pt aware.

## 2021-03-24 NOTE — Addendum Note (Signed)
Addended by: Alberteen Sam E on: 03/24/2021 08:17 AM   Modules accepted: Orders

## 2021-03-26 ENCOUNTER — Other Ambulatory Visit (HOSPITAL_BASED_OUTPATIENT_CLINIC_OR_DEPARTMENT_OTHER): Payer: 59

## 2021-03-27 ENCOUNTER — Ambulatory Visit (HOSPITAL_BASED_OUTPATIENT_CLINIC_OR_DEPARTMENT_OTHER): Payer: 59

## 2021-04-01 ENCOUNTER — Ambulatory Visit (HOSPITAL_BASED_OUTPATIENT_CLINIC_OR_DEPARTMENT_OTHER): Payer: 59

## 2021-04-02 ENCOUNTER — Encounter (HOSPITAL_BASED_OUTPATIENT_CLINIC_OR_DEPARTMENT_OTHER): Payer: Self-pay

## 2021-04-02 ENCOUNTER — Ambulatory Visit (HOSPITAL_BASED_OUTPATIENT_CLINIC_OR_DEPARTMENT_OTHER)
Admission: RE | Admit: 2021-04-02 | Discharge: 2021-04-02 | Disposition: A | Discharge status: Home / Self Care | Payer: 59 | Source: Ambulatory Visit | Attending: Gastroenterology | Admitting: Gastroenterology

## 2021-04-02 ENCOUNTER — Other Ambulatory Visit: Payer: Self-pay

## 2021-04-02 DIAGNOSIS — K581 Irritable bowel syndrome with constipation: Secondary | ICD-10-CM | POA: Diagnosis present

## 2021-04-02 DIAGNOSIS — Z86010 Personal history of colonic polyps: Secondary | ICD-10-CM | POA: Diagnosis present

## 2021-04-02 DIAGNOSIS — R1013 Epigastric pain: Secondary | ICD-10-CM | POA: Diagnosis present

## 2021-04-02 DIAGNOSIS — K5732 Diverticulitis of large intestine without perforation or abscess without bleeding: Secondary | ICD-10-CM | POA: Diagnosis present

## 2021-04-02 DIAGNOSIS — K219 Gastro-esophageal reflux disease without esophagitis: Secondary | ICD-10-CM | POA: Diagnosis present

## 2021-04-02 MED ORDER — IOHEXOL 300 MG/ML  SOLN
100.0000 mL | Freq: Once | INTRAMUSCULAR | Status: AC | PRN
  Administered 2021-04-02: 100 mL via INTRAVENOUS

## 2021-04-06 ENCOUNTER — Telehealth: Payer: Self-pay

## 2021-04-06 DIAGNOSIS — K581 Irritable bowel syndrome with constipation: Secondary | ICD-10-CM

## 2021-04-06 DIAGNOSIS — Z86010 Personal history of colonic polyps: Secondary | ICD-10-CM

## 2021-04-06 DIAGNOSIS — R1013 Epigastric pain: Secondary | ICD-10-CM

## 2021-04-06 DIAGNOSIS — K5732 Diverticulitis of large intestine without perforation or abscess without bleeding: Secondary | ICD-10-CM

## 2021-04-06 DIAGNOSIS — K219 Gastro-esophageal reflux disease without esophagitis: Secondary | ICD-10-CM

## 2021-04-06 NOTE — Telephone Encounter (Signed)
Patient returning your call.

## 2021-04-06 NOTE — Telephone Encounter (Signed)
Patient will get CBC CRP and High Sensitivity CRP done in [place]. Order is in. Patient will call with phone and fax number for [facility] for the order forn to be sent to

## 2021-04-06 NOTE — Telephone Encounter (Signed)
Recent imaging looks good but patient needs to see about repeating her  High sensitivity CRP and regular CRP lab in 2 weeks. Mailbox is full so I cant leave her a message

## 2021-04-08 NOTE — Telephone Encounter (Signed)
[phone] and the Phone [phone].  Requisition form sent.

## 2021-04-09 ENCOUNTER — Encounter: Payer: Self-pay | Admitting: Gastroenterology

## 2021-04-10 NOTE — Telephone Encounter (Signed)
Regular CBC and CRP placed on Dr Chales Abrahams desk for this patient

## 2021-04-29 ENCOUNTER — Other Ambulatory Visit: Payer: Self-pay | Admitting: Gastroenterology

## 2021-05-05 NOTE — Progress Notes (Signed)
 PCP:  Bliss, Laura K, MD   Chief Complaint  Patient presents with  . Gynecologic Exam    Left breast behind nipple sore  . Urinary Tract Infection    Was having frequency and burning urinating, pressure     HPI:      [name] is a 50 y.o. No obstetric history on file. who LMP was No LMP recorded. Patient has had a hysterectomy., presents today for her annual examination.  Her menses are absent due to hyst for leio/DUB. Has 1 ovary. Has vasomotor sx.  Dysmenorrhea none. She does not have PMB.  Sex activity: currently sexually active; s/p hyst. No vaginal dryness/pain.  Last Pap: 07/26/18  Results were: no abnormalities /neg HPV DNA   Had UTI sx of frequency/urgency with pelvic pressure/pain last wk. Urine was dark and cloudy. No hematuria, LBP, fevers. Had some vaginal itching without d/c, odor. No recent abx use. Took AZO and cystex with some. Has been drinking more caffeine recently.   Last mammogram: 08/17/19 Results were normal, repeat in 12 months.  There is a strong FH of breast cancer in pat cousins/pat aunt. There is a FH of ovarian cancer in a pat cousin. A cousin was BRCA neg but several deceased. Pt is BRCA/colaris neg 2014; MyRisk neg 2019. IBIS=12.4%. The patient does self-breast exams. Has had pain behind the LT nipple for past 2 wks without masses. Pain is improving. Increased caffeine recently. No erythema, trauma, nipple d/c.   Tobacco use: The patient denies current or previous tobacco use. Alcohol use: social No drug use.  Exercise: moderately active  Colonoscopy: s/p diverticulitis/intestinal abscess 11/21; being followed by GI, has upcoming colonoscopy, not sched yet.  She does get adequate calcium but not Vitamin D in her diet. Would like labs done. FH hypothyroidism and pt has noted increased thinning hair/hair loss as well as wt gain. No significant diet changes.    Past Medical History:  Diagnosis Date  . ADD (attention deficit disorder)    . Anemia    as child  . Anxiety   . Arthritis   . Asthma   . Bowel obstruction ([facility])   . BRCA negative 01/2013, 9/19   BRCA/Colaris neg; MyRisk neg 9/19  . Chronic headaches   . Colon polyps    2010-2011  . DDD (degenerative disc disease), cervical   . Depression   . Diverticulitis   . Diverticulosis   . Family history of breast cancer 07/2018   IBIS=12.4%  . GERD (gastroesophageal reflux disease)   . History of snoring   . Hypertension   . IBS (irritable bowel syndrome)   . Migraines    had one 2 weeks ago  . Pneumonia    2014  . PONV (postoperative nausea and vomiting)   . Spondylolisthesis of lumbar region     Past Surgical History:  Procedure Laterality Date  . ABDOMINAL HYSTERECTOMY  11/08/2000   TAH RSO, LOA due to pelvic pain/menometrorrhagia. Dr. Van Dalen. Partical  . ANTERIOR CERVICAL DECOMP/DISCECTOMY FUSION N/A 05/25/2018   Procedure: ANTERIOR CERVICAL DECOMPRESSION FUSION CERVICAL FOUR-FIVE,CERVICAL FIVE-SIX,CERVICAL SIX-SEVEN.;  Surgeon: Jones, David S, MD;  Location: [facility];  Service: Neurosurgery;  Laterality: N/A;  right side approach  . BACK SURGERY  08/2017   Lumbar surgery with Dr. David Jones  . CARPAL TUNNEL RELEASE Left   . CESAREAN SECTION     X 2  . COLONOSCOPY     2009  . ESOPHAGOGASTRODUODENOSCOPY  02/13/2009     Minimal gastritis. Otherwise normal EGD.   . HAND SURGERY Left   . 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 WRIST SURGERY Left    Cartlige bremoved    Family History  Problem Relation Age of Onset  . Lung cancer Maternal Aunt        60s  . Breast cancer Paternal Aunt   . Prostate cancer Paternal Uncle   . Liver cancer Maternal Grandmother        liver disease  . Diabetes Maternal Grandmother   . Ovarian cancer Other   . Breast cancer Other        5 cousins, 25-50 yrs old  . Ovarian cancer Cousin   . Breast cancer Cousin   . Brain cancer Cousin   . Stomach cancer Cousin   . Aneurysm Cousin   . Colon cancer Cousin    . Irritable bowel syndrome Mother   . Kidney disease Maternal Grandfather   . Diabetes Maternal Grandfather   . Liver disease Maternal Grandfather   . Diabetes Maternal Aunt   . Lung cancer Maternal Aunt     Social History   Socioeconomic History  . Marital status: Divorced    Spouse name: Not on file  . Number of children: 2  . Years of education: Not on file  . Highest education level: Not on file  Occupational History  . Occupation: medical lab  Tobacco Use  . Smoking status: Never Smoker  . Smokeless tobacco: Never Used  Vaping Use  . Vaping Use: Never used  Substance and Sexual Activity  . Alcohol use: Not Currently    Comment: rare  . Drug use: No  . Sexual activity: Yes    Birth control/protection: Surgical    Comment: Hysterectomy  Other Topics Concern  . Not on file  Social History Narrative   ** Merged History Encounter **       Social Determinants of Health   Financial Resource Strain: Not on file  Food Insecurity: Not on file  Transportation Needs: Not on file  Physical Activity: Not on file  Stress: Not on file  Social Connections: Not on file  Intimate Partner Violence: Not on file    Outpatient Medications Prior to Visit  Medication Sig Dispense Refill  . albuterol (VENTOLIN HFA) 108 (90 Base) MCG/ACT inhaler Inhale 2 puffs into the lungs every 4 (four) hours as needed for wheezing or shortness of breath. 18 g 0  . buPROPion (WELLBUTRIN XL) 300 MG 24 hr tablet Take 300 mg by mouth daily.    . dexlansoprazole (DEXILANT) 60 MG capsule TAKE 1 CAPSULE BY MOUTH EVERY DAY 90 capsule 4  . losartan-hydrochlorothiazide (HYZAAR) 50-12.5 MG per tablet Take 1 tablet by mouth at bedtime.     . methylphenidate (RITALIN) 10 MG tablet Take 20 mg by mouth 2 (two) times daily.    . ondansetron (ZOFRAN-ODT) 4 MG disintegrating tablet Take 4 mg by mouth every 8 (eight) hours as needed for nausea or vomiting.     . clotrimazole-betamethasone (LOTRISONE) cream APPLY  TO AFFECTED AREA TWICE A DAY AS NEEDED FOR SYMPTOMS FOR UPTO 2 WEEKS 15 g 0  . fluconazole (DIFLUCAN) 150 MG tablet Take 150 mg by mouth once a week.    . mometasone (ELOCON) 0.1 % cream Apply 1 application topically daily as needed (excema).     . oxyCODONE-acetaminophen (PERCOCET/ROXICET) 5-325 MG tablet Take 1-2 tablets by mouth every 6 (six) hours as needed (FOR PAIN.).   50 tablet 0   No facility-administered medications prior to visit.      ROS:  Review of Systems  Constitutional: Negative for fatigue, fever and unexpected weight change.  Respiratory: Negative for cough, shortness of breath and wheezing.   Cardiovascular: Negative for chest pain, palpitations and leg swelling.  Gastrointestinal: Positive for constipation. Negative for blood in stool, diarrhea, nausea and vomiting.  Endocrine: Negative for cold intolerance, heat intolerance and polyuria.  Genitourinary: Positive for dysuria, frequency and urgency. Negative for dyspareunia, flank pain, genital sores, hematuria, menstrual problem, pelvic pain, vaginal bleeding, vaginal discharge and vaginal pain.  Musculoskeletal: Positive for arthralgias. Negative for back pain, joint swelling and myalgias.  Skin: Negative for rash.  Neurological: Negative for dizziness, syncope, light-headedness, numbness and headaches.  Hematological: Negative for adenopathy.  Psychiatric/Behavioral: Negative for agitation, confusion, dysphoric mood, sleep disturbance and suicidal ideas. The patient is not nervous/anxious.    BREAST: pain   Objective: BP 110/80   Ht 4' 11" (1.499 m)   Wt 214 lb (97.1 kg)   BMI 43.22 kg/m    Physical Exam Constitutional:      Appearance: She is well-developed.  Genitourinary:     Vulva normal.     Genitourinary Comments: UTERUS/CX SURG REM     Right Labia: No rash, tenderness or lesions.    Left Labia: No tenderness, lesions or rash.    Vaginal cuff intact.    No vaginal discharge, erythema or  tenderness.      Right Adnexa: not tender and no mass present.    Left Adnexa: not tender and no mass present.    Cervix is absent.     Uterus is absent.  Breasts:     Right: No mass, nipple discharge, skin change or tenderness.     Left: Tenderness present. No mass, nipple discharge or skin change.    Neck:     Thyroid: No thyromegaly.  Cardiovascular:     Rate and Rhythm: Normal rate and regular rhythm.     Heart sounds: Normal heart sounds. No murmur heard.   Pulmonary:     Effort: Pulmonary effort is normal.     Breath sounds: Normal breath sounds.  Chest:    Abdominal:     Palpations: Abdomen is soft.     Tenderness: There is no abdominal tenderness. There is no guarding.  Musculoskeletal:        General: Normal range of motion.     Cervical back: Normal range of motion.  Neurological:     General: No focal deficit present.     Mental Status: She is alert and oriented to person, place, and time.     Cranial Nerves: No cranial nerve deficit.  Skin:    General: Skin is warm and dry.  Psychiatric:        Mood and Affect: Mood normal.        Behavior: Behavior normal.        Thought Content: Thought content normal.        Judgment: Judgment normal.  Vitals reviewed.    Results for orders placed or performed in visit on 05/06/21 (from the past 24 hour(s))  POCT Urinalysis Dipstick     Status: Normal   Collection Time: 05/06/21 11:12 AM  Result Value Ref Range   Color, UA yellow    Clarity, UA clear    Glucose, UA Negative Negative   Bilirubin, UA neg    Ketones, UA neg    Spec Grav, UA  1.015 1.010 - 1.025   Blood, UA neg    pH, UA 6.0 5.0 - 8.0   Protein, UA Negative Negative   Urobilinogen, UA     Nitrite, UA neg    Leukocytes, UA Negative Negative   Appearance     Odor       Assessment/Plan: Encounter for annual routine gynecological examination  Encounter for screening mammogram for malignant neoplasm of breast - Plan: MM 3D SCREEN BREAST  BILATERAL; pt to sched mammo  Nipple pain--sx improving, neg exam. Can do scr mammo. F/u if sx worsen and will sched dx mammo  Screening for colon cancer--pt being followed by GI  UTI symptoms - Plan: POCT Urinalysis Dipstick, Urine Culture; neg UA. Check C&S. If neg, increase water, decrease caffeine  Blood tests for routine general physical examination - Plan: TSH + free T4, Hemoglobin A1c, Hemoglobin A1c, TSH + free T4  Weight gain - Plan: Hemoglobin A1c, Hemoglobin A1c  Thyroid disorder screening - Plan: TSH + free T4, TSH + free T4  Screening for diabetes mellitus - Plan: TSH + free T4, TSH + free T4  BMI 40.0-44.9, adult (HCC) - Plan: Hemoglobin A1c, Hemoglobin A1c  Labs done at [facility] since pt works there.           GYN counsel breast self exam, mammography screening, adequate intake of calcium and vitamin D, diet and exercise     F/U  Return in about 1 year (around 05/06/2022).  Alicia B. Copland, PA-C 05/06/2021 11:12 AM 

## 2021-05-06 ENCOUNTER — Ambulatory Visit (INDEPENDENT_AMBULATORY_CARE_PROVIDER_SITE_OTHER): Payer: 59 | Admitting: Obstetrics and Gynecology

## 2021-05-06 ENCOUNTER — Other Ambulatory Visit: Payer: Self-pay

## 2021-05-06 ENCOUNTER — Encounter: Payer: Self-pay | Admitting: Obstetrics and Gynecology

## 2021-05-06 VITALS — BP 110/80 | Ht 59.0 in | Wt 214.0 lb

## 2021-05-06 DIAGNOSIS — Z01419 Encounter for gynecological examination (general) (routine) without abnormal findings: Secondary | ICD-10-CM

## 2021-05-06 DIAGNOSIS — N644 Mastodynia: Secondary | ICD-10-CM | POA: Diagnosis not present

## 2021-05-06 DIAGNOSIS — Z131 Encounter for screening for diabetes mellitus: Secondary | ICD-10-CM

## 2021-05-06 DIAGNOSIS — Z0000 Encounter for general adult medical examination without abnormal findings: Secondary | ICD-10-CM

## 2021-05-06 DIAGNOSIS — Z1329 Encounter for screening for other suspected endocrine disorder: Secondary | ICD-10-CM

## 2021-05-06 DIAGNOSIS — R635 Abnormal weight gain: Secondary | ICD-10-CM

## 2021-05-06 DIAGNOSIS — Z6841 Body Mass Index (BMI) 40.0 and over, adult: Secondary | ICD-10-CM

## 2021-05-06 DIAGNOSIS — Z1231 Encounter for screening mammogram for malignant neoplasm of breast: Secondary | ICD-10-CM

## 2021-05-06 DIAGNOSIS — R399 Unspecified symptoms and signs involving the genitourinary system: Secondary | ICD-10-CM | POA: Diagnosis not present

## 2021-05-06 DIAGNOSIS — Z1211 Encounter for screening for malignant neoplasm of colon: Secondary | ICD-10-CM | POA: Diagnosis not present

## 2021-05-06 LAB — POCT URINALYSIS DIPSTICK
Bilirubin, UA: NEGATIVE
Blood, UA: NEGATIVE
Glucose, UA: NEGATIVE
Ketones, UA: NEGATIVE
Leukocytes, UA: NEGATIVE
Nitrite, UA: NEGATIVE
Protein, UA: NEGATIVE
Spec Grav, UA: 1.015 (ref 1.010–1.025)
pH, UA: 6 (ref 5.0–8.0)

## 2021-05-06 NOTE — Patient Instructions (Addendum)
I value your feedback and you entrusting us with your care. If you get a [facility] patient survey, I would appreciate you taking the time to let us know about your experience today. Thank you!  [facility]: [phone]      

## 2021-05-09 LAB — URINE CULTURE: Organism ID, Bacteria: NO GROWTH

## 2021-06-11 ENCOUNTER — Ambulatory Visit
Admission: RE | Admit: 2021-06-11 | Discharge: 2021-06-11 | Disposition: A | Discharge status: Home / Self Care | Payer: 59 | Source: Ambulatory Visit | Attending: Obstetrics and Gynecology | Admitting: Obstetrics and Gynecology

## 2021-06-11 ENCOUNTER — Other Ambulatory Visit: Payer: Self-pay

## 2021-06-11 DIAGNOSIS — Z1231 Encounter for screening mammogram for malignant neoplasm of breast: Secondary | ICD-10-CM | POA: Diagnosis present

## 2021-09-10 ENCOUNTER — Other Ambulatory Visit: Payer: Self-pay | Admitting: Family Medicine

## 2021-09-10 DIAGNOSIS — E049 Nontoxic goiter, unspecified: Secondary | ICD-10-CM

## 2021-09-22 ENCOUNTER — Ambulatory Visit
Admission: RE | Admit: 2021-09-22 | Discharge: 2021-09-22 | Disposition: A | Discharge status: Home / Self Care | Payer: 59 | Source: Ambulatory Visit | Attending: Family Medicine | Admitting: Family Medicine

## 2021-09-22 ENCOUNTER — Other Ambulatory Visit: Payer: Self-pay

## 2021-09-22 DIAGNOSIS — E049 Nontoxic goiter, unspecified: Secondary | ICD-10-CM | POA: Diagnosis present

## 2021-09-24 ENCOUNTER — Ambulatory Visit: Payer: 59

## 2021-10-26 ENCOUNTER — Ambulatory Visit
Admission: RE | Admit: 2021-10-26 | Discharge: 2021-10-26 | Disposition: A | Discharge status: Home / Self Care | Payer: 59 | Source: Ambulatory Visit | Attending: Emergency Medicine | Admitting: Emergency Medicine

## 2021-10-26 ENCOUNTER — Other Ambulatory Visit: Payer: Self-pay

## 2021-10-26 VITALS — BP 150/98 | HR 122 | Temp 98.2°F | Resp 20

## 2021-10-26 DIAGNOSIS — J101 Influenza due to other identified influenza virus with other respiratory manifestations: Secondary | ICD-10-CM | POA: Diagnosis not present

## 2021-10-26 DIAGNOSIS — H7292 Unspecified perforation of tympanic membrane, left ear: Secondary | ICD-10-CM | POA: Diagnosis not present

## 2021-10-26 MED ORDER — ALBUTEROL SULFATE HFA 108 (90 BASE) MCG/ACT IN AERS: 2.0000 | INHALATION_SPRAY | RESPIRATORY_TRACT | 0 refills | Status: AC | PRN

## 2021-10-26 MED ORDER — PROMETHAZINE-DM 6.25-15 MG/5ML PO SYRP: 5.0000 mL | ORAL_SOLUTION | Freq: Four times a day (QID) | ORAL | 0 refills | Status: DC | PRN

## 2021-10-26 MED ORDER — CYCLOBENZAPRINE HCL 10 MG PO TABS: 10.0000 mg | ORAL_TABLET | Freq: Two times a day (BID) | ORAL | 0 refills | Status: DC | PRN

## 2021-10-26 MED ORDER — IBUPROFEN 800 MG PO TABS: 800.0000 mg | ORAL_TABLET | Freq: Three times a day (TID) | ORAL | 0 refills | Status: DC

## 2021-10-26 MED ORDER — BENZONATATE 100 MG PO CAPS
100.0000 mg | ORAL_CAPSULE | Freq: Three times a day (TID) | ORAL | 0 refills | Status: DC
Start: 2021-10-26 — End: 2022-04-29

## 2021-10-26 MED ORDER — OFLOXACIN 0.3 % OT SOLN: 5.0000 [drp] | Freq: Two times a day (BID) | OTIC | 0 refills | Status: DC

## 2021-10-26 NOTE — ED Triage Notes (Signed)
Pt presents today with c/o cough, SOB, headache and fever. Symptoms began 6 days ago, with an increase in temp on Saturday. Temp yesterday 102.6, took Theraflu today at 2:30p.

## 2021-10-26 NOTE — Discharge Instructions (Signed)
Your symptoms today are consistent with the current presentation influenza A, therefore you will be treated as such unfortunately due to you being sick already for 5 to 6 days we are unable to use antiviral medications and will focus treatment on reducing your symptoms to make them more tolerable until the virus resolves itself  You may use Promethazine DM every 6 hours to help with nausea and coughing, be mindful this medication may have a sedative effect  You may use a Tessalon pill every 8 hours to help calm your coughing  You may use ibuprofen every 8 hours to help with your fever and body aches, this may be used in addition to Tylenol  You may use Flexeril twice daily as needed for body aches  You may use your inhaler every 4 hours as needed for increased shortness of breath  May follow-up with urgent care as needed  Below are other over-the-counter medications that you may try to help with your symptoms   For cough: honey 1/2 to 1 teaspoon (you can dilute the honey in water or another fluid).  You can also use guaifenesin and dextromethorphan for cough. You can use a humidifier for chest congestion and cough.  If you don't have a humidifier, you can sit in the bathroom with the hot shower running.      For sore throat: try warm salt water gargles, cepacol lozenges, throat spray, warm tea or water with lemon/honey, popsicles or ice, or OTC cold relief medicine for throat discomfort.   For congestion: take a daily anti-histamine like Zyrtec, Claritin, and a oral decongestant, such as pseudoephedrine.  You can also use Flonase 1-2 sprays in each nostril daily.   It is important to stay hydrated: drink plenty of fluids (water, gatorade/powerade/pedialyte, juices, or teas) to keep your throat moisturized and help further relieve irritation/discomfort.

## 2021-10-26 NOTE — ED Provider Notes (Signed)
[facility]    CSN: [id number] Arrival date & time: 10/26/21  1625      History   Chief Complaint Chief Complaint  Patient presents with   Appt'@5'    Cough   Fever   Shortness of Breath   Headache    HPI Christina Potts is a 50 y.o. female.   Presents with fever, nasal congestion, bilateral ear pain, rhinorrhea, sinus pain, sore throat, nonproductive cough, increased shortness of breath, nausea without vomiting and intermittent generalized headaches for 6 days. Attempted use of theraflu, alka seltzer cold plus, not helpful. Known sick contacts.  History of asthma, hypertension.  Past Medical History:  Diagnosis Date   ADD (attention deficit disorder)    Anemia    as child   Anxiety    Arthritis    Asthma    Bowel obstruction ([place])    BRCA negative 01/2013, 9/19   BRCA/Colaris neg; MyRisk neg 9/19   Chronic headaches    Colon polyps    2010-2011   DDD (degenerative disc disease), cervical    Depression    Diverticulitis    Diverticulosis    Family history of breast cancer 07/2018   IBIS=12.4%   GERD (gastroesophageal reflux disease)    History of snoring    Hypertension    IBS (irritable bowel syndrome)    Migraines    had one 2 weeks ago   Pneumonia    2014   PONV (postoperative nausea and vomiting)    Spondylolisthesis of lumbar region     Patient Active Problem List   Diagnosis Date Noted   Hypokalemia 07/26/2018   Perimenopausal vasomotor symptoms 07/26/2018   Cervical vertebral fusion 05/25/2018   S/P lumbar spinal fusion 09/21/2017    Past Surgical History:  Procedure Laterality Date   ABDOMINAL HYSTERECTOMY  11/08/2000   TAH RSO, LOA due to pelvic pain/menometrorrhagia. Dr. Ammie Dalton. Partical   ANTERIOR CERVICAL DECOMP/DISCECTOMY FUSION N/A 05/25/2018   Procedure: ANTERIOR CERVICAL DECOMPRESSION FUSION CERVICAL FOUR-FIVE,CERVICAL FIVE-SIX,CERVICAL SIX-SEVEN.;  Surgeon: Eustace Moore, MD;  Location: [facility];  Service:  Neurosurgery;  Laterality: N/A;  right side approach   BACK SURGERY  08/2017   Lumbar surgery with Dr. Sherley Bounds   CARPAL TUNNEL RELEASE Left    CESAREAN SECTION     X 2   COLONOSCOPY     2009   ESOPHAGOGASTRODUODENOSCOPY  02/13/2009   Minimal gastritis. Otherwise normal EGD.    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     WRIST SURGERY Left    Cartlige bremoved    OB History     Gravida  3   Para  2   Term  1   Preterm  1   AB  1   Living  2      SAB  1   IAB  0   Ectopic  0   Multiple      Live Births  2            Home Medications    Prior to Admission medications   Medication Sig Start Date End Date Taking? Authorizing Provider  albuterol (VENTOLIN HFA) 108 (90 Base) MCG/ACT inhaler Inhale 2 puffs into the lungs every 4 (four) hours as needed for wheezing or shortness of breath. 12/14/19   Karen Kitchens, NP  buPROPion (WELLBUTRIN XL) 300 MG 24 hr tablet Take 300 mg by mouth daily.    [provider]  dexlansoprazole (DEXILANT) 60 MG capsule TAKE 1 CAPSULE BY MOUTH EVERY DAY 04/29/21   Jackquline Denmark, MD  losartan-hydrochlorothiazide [facility]) 50-12.5 MG per tablet Take 1 tablet by mouth at bedtime.     [provider]  methylphenidate (RITALIN) 10 MG tablet Take 20 mg by mouth 2 (two) times daily.    [provider]  ondansetron (ZOFRAN-ODT) 4 MG disintegrating tablet Take 4 mg by mouth every 8 (eight) hours as needed for nausea or vomiting.     [provider]  dicyclomine (BENTYL) 10 MG capsule TAKE 1 CAPSULE (10 MG TOTAL) BY MOUTH 2 (TWO) TIMES DAILY AS NEEDED FOR SPASMS. 12/25/18 12/14/19  Jackquline Denmark, MD    Family History Family History  Problem Relation Age of Onset   Lung cancer Maternal Aunt        81s   Breast cancer Paternal Aunt    Prostate cancer Paternal Uncle    Liver cancer Maternal Grandmother        liver disease   Diabetes Maternal Grandmother    Ovarian cancer Other     Breast cancer Other        5 cousins, 53-50 yrs old   Ovarian cancer Cousin    Breast cancer Cousin    Brain cancer Cousin    Stomach cancer Cousin    Aneurysm Cousin    Colon cancer Cousin    Irritable bowel syndrome Mother    Kidney disease Maternal Grandfather    Diabetes Maternal Grandfather    Liver disease Maternal Grandfather    Diabetes Maternal Aunt    Lung cancer Maternal Aunt     Social History Social History   Tobacco Use   Smoking status: Never   Smokeless tobacco: Never  Vaping Use   Vaping Use: Never used  Substance Use Topics   Alcohol use: Not Currently    Comment: rare   Drug use: No     Allergies   Dilaudid [hydromorphone hcl], Strawberry (diagnostic), and Morphine and related   Review of Systems Review of Systems  Constitutional:  Positive for fever. Negative for activity change, appetite change, chills, diaphoresis, fatigue and unexpected weight change.  HENT:  Positive for congestion, ear pain, rhinorrhea, sinus pain and sore throat. Negative for dental problem, drooling, ear discharge, facial swelling, hearing loss, mouth sores, nosebleeds, postnasal drip, sinus pressure, sneezing, tinnitus, trouble swallowing and voice change.   Respiratory:  Positive for cough and shortness of breath. Negative for apnea, choking, chest tightness, wheezing and stridor.   Cardiovascular: Negative.   Gastrointestinal:  Positive for nausea. Negative for abdominal distention, abdominal pain, anal bleeding, blood in stool, constipation, diarrhea, rectal pain and vomiting.  Skin: Negative.   Neurological:  Positive for headaches.    Physical Exam Triage Vital Signs ED Triage Vitals  Enc Vitals Group     BP 10/26/21 1712 (!) 150/98     Pulse Rate 10/26/21 1712 (!) 122     Resp 10/26/21 1712 20     Temp 10/26/21 1712 98.2 F (36.8 C)     Temp Source 10/26/21 1712 Oral     SpO2 10/26/21 1712 99 %     Weight --      Height --      Head Circumference --       Peak Flow --      Pain Score 10/26/21 1714 7     Pain Loc --      Pain Edu? --      Excl.  in [place]? --    No data found.  Updated Vital Signs BP (!) 150/98 (BP Location: Left Arm)   Pulse (!) 122   Temp 98.2 F (36.8 C) (Oral)   Resp 20   SpO2 99%   Visual Acuity Right Eye Distance:   Left Eye Distance:   Bilateral Distance:    Right Eye Near:   Left Eye Near:    Bilateral Near:     Physical Exam Constitutional:      Appearance: Normal appearance. She is ill-appearing.  HENT:     Head: Normocephalic.     Right Ear: Hearing, tympanic membrane, ear canal and external ear normal.     Left Ear: Hearing, ear canal and external ear normal. Tympanic membrane is perforated.     Nose: Congestion and rhinorrhea present.     Mouth/Throat:     Mouth: Mucous membranes are moist.     Pharynx: Posterior oropharyngeal erythema present.  Eyes:     Extraocular Movements: Extraocular movements intact.  Cardiovascular:     Rate and Rhythm: Normal rate and regular rhythm.     Pulses: Normal pulses.     Heart sounds: Normal heart sounds.  Pulmonary:     Effort: Pulmonary effort is normal.     Breath sounds: Normal breath sounds.  Musculoskeletal:        General: Normal range of motion.     Cervical back: Normal range of motion.  Lymphadenopathy:     Cervical: Cervical adenopathy present.  Skin:    General: Skin is warm and dry.  Neurological:     General: No focal deficit present.     Mental Status: She is alert and oriented to person, place, and time. Mental status is at baseline.  Psychiatric:        Mood and Affect: Mood normal.        Behavior: Behavior normal.     UC Treatments / Results  Labs (all labs ordered are listed, but only abnormal results are displayed) Labs Reviewed - No data to display  EKG   Radiology No results found.  Procedures Procedures (including critical care time)  Medications Ordered in UC Medications - No data to display  Initial  Impression / Assessment and Plan / UC Course  I have reviewed the triage vital signs and the nursing notes.  Pertinent labs & imaging results that were available during my care of the patient were reviewed by me and considered in my medical decision making (see chart for details).  Influenza A Perforated eardrum left  1.  Albuterol inhaler 108 mcg 2 puffs every 4 hours as needed refilled 2.  Tessalon 100 mg 3 times daily as needed 3.  Promethazine DM 6.25-15 mg / 5 mL every 4 hours as needed 4.  Flexeril 10 mg twice daily as needed 5.  Ibuprofen 800 mg 3 times daily as needed 6.  Ofloxacin 0.3% 5 drops left ear twice daily 7.  Urgent care follow-up as needed Final Clinical Impressions(s) / UC Diagnoses   Final diagnoses:  None   Discharge Instructions   None    ED Prescriptions   None    PDMP not reviewed this encounter.   Hans Eden, NP 10/26/21 1819

## 2022-04-01 ENCOUNTER — Ambulatory Visit
Admission: RE | Admit: 2022-04-01 | Discharge: 2022-04-01 | Disposition: A | Discharge status: Home / Self Care | Payer: 59 | Attending: Family Medicine | Admitting: Family Medicine

## 2022-04-01 ENCOUNTER — Ambulatory Visit
Admission: RE | Admit: 2022-04-01 | Discharge: 2022-04-01 | Disposition: A | Discharge status: Home / Self Care | Payer: 59 | Source: Ambulatory Visit | Attending: Family Medicine | Admitting: Family Medicine

## 2022-04-01 ENCOUNTER — Other Ambulatory Visit: Payer: Self-pay | Admitting: Family Medicine

## 2022-04-01 DIAGNOSIS — R059 Cough, unspecified: Secondary | ICD-10-CM | POA: Diagnosis not present

## 2022-04-29 ENCOUNTER — Encounter: Payer: Self-pay | Admitting: Gastroenterology

## 2022-04-29 ENCOUNTER — Other Ambulatory Visit: Payer: 59

## 2022-04-29 ENCOUNTER — Ambulatory Visit (INDEPENDENT_AMBULATORY_CARE_PROVIDER_SITE_OTHER): Payer: 59 | Admitting: Gastroenterology

## 2022-04-29 VITALS — BP 148/88 | HR 88 | Ht 59.0 in | Wt 227.5 lb

## 2022-04-29 DIAGNOSIS — K581 Irritable bowel syndrome with constipation: Secondary | ICD-10-CM | POA: Diagnosis not present

## 2022-04-29 DIAGNOSIS — K219 Gastro-esophageal reflux disease without esophagitis: Secondary | ICD-10-CM

## 2022-04-29 DIAGNOSIS — R1011 Right upper quadrant pain: Secondary | ICD-10-CM

## 2022-04-29 DIAGNOSIS — K5732 Diverticulitis of large intestine without perforation or abscess without bleeding: Secondary | ICD-10-CM

## 2022-04-29 MED ORDER — DEXLANSOPRAZOLE 60 MG PO CPDR: 60.0000 mg | DELAYED_RELEASE_CAPSULE | Freq: Every day | ORAL | 11 refills | Status: DC

## 2022-04-29 NOTE — Progress Notes (Signed)
Chief Complaint: FU  Referring Provider:  Lynnell Jude, MD      ASSESSMENT AND PLAN;   #1.  RUQ Pain  #2. H/O Recurrent descending colon diverticulitis  -Adm 11/30-12/03/2020 to [facility] with 2.4 cm div abscess, Rx with Levaquin/Flagyl. Resolved. Neg CT 03/2021 -01/19/2019 on CT at [facility]. Rx with augmentin.  -Neg colon 09/2018 except for pancolonic diverticulosis.  #2. IBS-C (with alt diarrhea) #3. GERD   Plan: -CBC, CMP, lipase (pl give order, she wants to get these done from [facility]- she works there) -[place] abdo complete -Miralax 17g po QD -No NSAIDs. -Dexilant 60 mg p.o. QD #30. 11RF -If still with problems, trial of bentyl  HPI:    Christina Potts is a 51 y.o. female  Works in the lab Engineer, building services) at [employer]  With over [place] H/O abdo pain with alternating constipation and rare diarrhea.  Diagnosed with IBS.  C/O RUQ pain x 2 to 3 months Mostly intermittent, nonradiating Worse with meals Not related to defecation. Denies having any significant nausea/vomiting.  No heartburn as long as she takes Dexilant.  Nl CBC, CMP 03/2021 Neg CT Abdo/pelvis 03/2021  She does a longstanding history of constipation with occasional alternating diarrhea with abdominal bloating. No significant rectal bleeding.  No further attacks of diverticulitis.  Has history of recurrent left colonic diverticulitis x intermittently over last 5 to 6 years.  Prev H/O: 11/30 - 11/02/2020 adm with  2.4 cm diverticular abscess in descending colon. Too small for IR drainage, surgery recommended medical treatment for now and consideration for 1 stage surgery as an outpatient.  She was treated with IV followed by p.o. Levaquin/Flagyl with good results.Rpt CT 03/2021-complete resolution.    On review of previous records:  - ED visit to [facility] Feb 2020 with LLQ pain. CT AP- descending colon diverticulitis without abscess. Rxed with Augmentin 875 BID  -Adm 10/28/2020-  11/02/2020 [facility]). Reviewed: Patient with a 6 day history of abdominal pain, nausea, hematochezia, trouble with PO intake, and fever (Tmax 102.5). Long history diverticular disease with multiple flares. On presentation, leukocytosis noted at 14.3. Colonic abscess on CT measuring 2.4cm. No perf or obstruction. Consulted surgery who recommended serial abdominal exams and consult IR given that she does not currently have an acute abdomen. IR consulted, abscess currently too small for percutaneous drainage. Given the size of the abscess as <4cm, continued medical management with Zosyn, IVFs, bowel rest and symptom control. Patient's symptoms continued to improve and her diet was advanced. On day 4 of hospitalization, repeat CT abdomen showed improvement of inflammatory changes and no readily apparent abscess.Pt transitioned to oral Levaquin and metronidazole which she will continue on discharge.   Past GI work-up: EGD 09/2018 - Mild Gastritis. Bx neg for HP, neg SB bx for celiac  Colonoscopy 09/2018 -Pancolonic diverticulosis predominantly in the sigmoid colon. -Small internal hemorrhoids (likely etiology of rectal bleeding, no bleeding currently) -Otherwise normal colonoscopy to TI. -Rpt 10 yrs. Earlier if any new problems or change in family history.  CT AP with contrast 04/02/2021 IMPRESSION: 1. No acute abdominopelvic findings. 2. Colonic diverticulosis without findings of acute diverticulitis. Past Medical History:  Diagnosis Date   ADD (attention deficit disorder)    Anemia    as child   Anxiety    Arthritis    Asthma    Bowel obstruction ([place])    BRCA negative 01/2013, 9/19   BRCA/Colaris neg; MyRisk neg 9/19   Chronic headaches    Colon  polyps    2010-2011   DDD (degenerative disc disease), cervical    Depression    Diverticulitis    Diverticulosis    Family history of breast cancer 07/2018   IBIS=12.4%   GERD (gastroesophageal reflux disease)    History of snoring     Hypertension    IBS (irritable bowel syndrome)    Migraines    had one 2 weeks ago   Pneumonia    2014   PONV (postoperative nausea and vomiting)    Spondylolisthesis of lumbar region     Past Surgical History:  Procedure Laterality Date   ABDOMINAL HYSTERECTOMY  11/08/2000   TAH RSO, LOA due to pelvic pain/menometrorrhagia. Dr. Ammie Dalton. Partical   ANTERIOR CERVICAL DECOMP/DISCECTOMY FUSION N/A 05/25/2018   Procedure: ANTERIOR CERVICAL DECOMPRESSION FUSION CERVICAL FOUR-FIVE,CERVICAL FIVE-SIX,CERVICAL SIX-SEVEN.;  Surgeon: Eustace Moore, MD;  Location: [facility];  Service: Neurosurgery;  Laterality: N/A;  right side approach   BACK SURGERY  08/2017   Lumbar surgery with Dr. Sherley Bounds   CARPAL TUNNEL RELEASE Left    CESAREAN SECTION     X 2   COLONOSCOPY     2009   ESOPHAGOGASTRODUODENOSCOPY  02/13/2009   Minimal gastritis. Otherwise normal EGD.    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     WRIST SURGERY Left    Cartlige bremoved    Family History  Problem Relation Age of Onset   Lung cancer Maternal Aunt        69s   Breast cancer Paternal Aunt    Prostate cancer Paternal Uncle    Liver cancer Maternal Grandmother        liver disease   Diabetes Maternal Grandmother    Ovarian cancer Other    Breast cancer Other        5 cousins, 50-50 yrs old   Ovarian cancer Cousin    Breast cancer Cousin    Brain cancer Cousin    Stomach cancer Cousin    Aneurysm Cousin    Colon cancer Cousin    Irritable bowel syndrome Mother    Kidney disease Maternal Grandfather    Diabetes Maternal Grandfather    Liver disease Maternal Grandfather    Diabetes Maternal Aunt    Lung cancer Maternal Aunt     Social History   Tobacco Use   Smoking status: Never   Smokeless tobacco: Never  Vaping Use   Vaping Use: Never used  Substance Use Topics   Alcohol use: Not Currently    Comment: rare   Drug use: No    Current Outpatient Medications  Medication  Sig Dispense Refill   albuterol (VENTOLIN HFA) 108 (90 Base) MCG/ACT inhaler Inhale 2 puffs into the lungs every 4 (four) hours as needed for wheezing or shortness of breath. 18 g 0   buPROPion (WELLBUTRIN XL) 300 MG 24 hr tablet Take 300 mg by mouth daily.     cyclobenzaprine (FLEXERIL) 10 MG tablet Take 1 tablet (10 mg total) by mouth 2 (two) times daily as needed for muscle spasms. 20 tablet 0   dexlansoprazole (DEXILANT) 60 MG capsule TAKE 1 CAPSULE BY MOUTH EVERY DAY 90 capsule 4   levalbuterol (XOPENEX HFA) 45 MCG/ACT inhaler SMARTSIG:2 Puff(s) Via Inhaler 4 Times Daily PRN     losartan-hydrochlorothiazide (HYZAAR) 50-12.5 MG per tablet Take 1 tablet by mouth at bedtime.      metFORMIN (GLUCOPHAGE-XR) 500 MG 24 hr tablet Take 500  mg by mouth 2 (two) times daily.     methylphenidate (RITALIN) 10 MG tablet Take 20 mg by mouth 2 (two) times daily.     ondansetron (ZOFRAN-ODT) 4 MG disintegrating tablet Take 4 mg by mouth every 8 (eight) hours as needed for nausea or vomiting.      No current facility-administered medications for this visit.    Allergies  Allergen Reactions   Dilaudid [Hydromorphone Hcl] Anaphylaxis and Shortness Of Breath    Patient reports 09/21/17 she tolerates oxycodone fine   Strawberry (Diagnostic) Anaphylaxis, Hives and Swelling   Morphine And Related Rash and Other (See Comments)    Burns my skin     Review of Systems:  neg     Physical Exam:    BP (!) 148/88   Pulse 88   Ht '4\' 11"'  (1.499 m)   Wt 227 lb 8 oz (103.2 kg)   SpO2 98%   BMI 45.95 kg/m  Filed Weights   04/29/22 1053  Weight: 227 lb 8 oz (103.2 kg)   Constitutional:  Well-developed, in no acute distress. Psychiatric: Normal mood and affect. Behavior is normal. HEENT: Pupils normal.  Conjunctivae are normal. No scleral icterus. Neck supple.  Well-healed surgical scar. Cardiovascular: Normal rate, regular rhythm. No edema Pulmonary/chest: Effort normal and breath sounds normal. No  wheezing, rales or rhonchi. Abdominal: Soft, nondistended. RUQ abdominal tenderness without rebound.  Bowel sounds active throughout. There are no masses palpable. No hepatomegaly. Neurological: Alert and oriented to person place and time. Skin: Skin is warm and dry. No rashes noted.  Data Reviewed: I have personally reviewed following labs and imaging studies  CBC:    Latest Ref Rng & Units 03/11/2021   10:28 AM 01/24/2019    9:45 AM 05/17/2018    2:52 PM  CBC  WBC 3.8 - 10.8 Thousand/uL 9.0   7.7   9.2    Hemoglobin 11.7 - 15.5 g/dL 15.9   15.5   15.1    Hematocrit 35.0 - 45.0 % 47.3   44.7   46.9    Platelets 140 - 400 Thousand/uL 334   365   321      CMP:    Latest Ref Rng & Units 03/11/2021   10:28 AM 07/26/2018    2:20 PM 05/17/2018    2:52 PM  CMP  Glucose 65 - 99 mg/dL 89   92   85    BUN 7 - 25 mg/dL '17   6   17    ' Creatinine 0.50 - 1.10 mg/dL 0.96   0.86   0.85    Sodium 135 - 146 mmol/L 138   142   138    Potassium 3.5 - 5.3 mmol/L 4.3   4.1   3.3    Chloride 98 - 110 mmol/L 99   101   97    CO2 20 - 32 mmol/L '27   24   30    ' Calcium 8.6 - 10.2 mg/dL 9.3   9.1   9.0    Total Protein 6.1 - 8.1 g/dL 6.8   6.4     Total Bilirubin 0.2 - 1.2 mg/dL 0.4   0.2     Alkaline Phos 39 - 117 IU/L  78     AST 10 - 35 U/L 18   17     ALT 6 - 29 U/L 18   19           Carmell Austria, MD 04/29/2022, 11:01 AM  Cc: Lynnell Jude, MD

## 2022-04-29 NOTE — Patient Instructions (Addendum)
If you are age 49 or older, your body mass index should be between 23-30. Your Body mass index is 45.95 kg/m. If this is out of the aforementioned range listed, please consider follow up with your Primary Care Provider.  If you are age 29 or younger, your body mass index should be between 19-25. Your Body mass index is 45.95 kg/m. If this is out of the aformentioned range listed, please consider follow up with your Primary Care Provider.   ________________________________________________________  The [facility] providers would like to encourage you to use [facility] to communicate with providers for non-urgent requests or questions.  Due to long hold times on the telephone, sending your provider a message by [facility] may be a faster and more efficient way to get a response.  Please allow 48 business hours for a response.  Please remember that this is for non-urgent requests.  _______________________________________________________  Your provider has requested that you go to the basement level for lab work before leaving today. Press "B" on the elevator. The lab is located at the first door on the left as you exit the elevator.   Please purchase the following medications over the counter and take as directed: Miralax 17g daily  No NSAID's  We have sent the following medications to your pharmacy for you to pick up at your convenience: [facility] have been scheduled for an abdominal ultrasound at [facility] (1st floor of hospital) on June 12th    at  [place]. Please arrive 30 minutes prior to your appointment for registration. Make certain not to have anything to eat or drink midnight prior to your appointment. Should you need to reschedule your appointment, please contact radiology at [phone]. This test typically takes about 30 minutes to perform.  Thank you,  Dr. Jackquline Denmark

## 2022-04-30 ENCOUNTER — Telehealth: Payer: Self-pay | Admitting: Gastroenterology

## 2022-04-30 LAB — CBC WITH DIFFERENTIAL/PLATELET
Absolute Monocytes: 510 cells/uL (ref 200–950)
Basophils Absolute: 44 cells/uL (ref 0–200)
Basophils Relative: 0.5 %
Eosinophils Absolute: 97 cells/uL (ref 15–500)
Eosinophils Relative: 1.1 %
HCT: 44.5 % (ref 35.0–45.0)
Hemoglobin: 15.1 g/dL (ref 11.7–15.5)
Lymphs Abs: 1487 cells/uL (ref 850–3900)
MCH: 31.6 pg (ref 27.0–33.0)
MCHC: 33.9 g/dL (ref 32.0–36.0)
MCV: 93.1 fL (ref 80.0–100.0)
MPV: 10.8 fL (ref 7.5–12.5)
Monocytes Relative: 5.8 %
Neutro Abs: 6662 cells/uL (ref 1500–7800)
Neutrophils Relative %: 75.7 %
Platelets: 297 10*3/uL (ref 140–400)
RBC: 4.78 10*6/uL (ref 3.80–5.10)
RDW: 12.3 % (ref 11.0–15.0)
Total Lymphocyte: 16.9 %
WBC: 8.8 10*3/uL (ref 3.8–10.8)

## 2022-04-30 LAB — COMPREHENSIVE METABOLIC PANEL
AG Ratio: 1.5 (calc) (ref 1.0–2.5)
ALT: 18 U/L (ref 6–29)
AST: 18 U/L (ref 10–35)
Albumin: 3.8 g/dL (ref 3.6–5.1)
Alkaline phosphatase (APISO): 79 U/L (ref 31–125)
BUN: 11 mg/dL (ref 7–25)
CO2: 26 mmol/L (ref 20–32)
Calcium: 8.9 mg/dL (ref 8.6–10.2)
Chloride: 102 mmol/L (ref 98–110)
Creat: 0.9 mg/dL (ref 0.50–0.99)
Globulin: 2.6 g/dL (calc) (ref 1.9–3.7)
Glucose, Bld: 88 mg/dL (ref 65–99)
Potassium: 4.2 mmol/L (ref 3.5–5.3)
Sodium: 137 mmol/L (ref 135–146)
Total Bilirubin: 0.3 mg/dL (ref 0.2–1.2)
Total Protein: 6.4 g/dL (ref 6.1–8.1)

## 2022-04-30 LAB — LIPASE: Lipase: 13 U/L (ref 7–60)

## 2022-04-30 NOTE — Telephone Encounter (Signed)
Patient called in requesting that Dr. Lyndel Safe send in Zofran-ODT 8mg . She has felt nauseous since yesterday after the OV. She is still able to hold down food/liquid, but feels sick to her stomach. She started taking zofran from a left over prescription she had at home yesterday which has provided some relief, but she is almost out. Will route to MD.

## 2022-04-30 NOTE — Telephone Encounter (Signed)
Inbound call from patient seeking advice if there is anyway she can have Zofran-ODT 8 mlg called in. Please advise.

## 2022-05-03 NOTE — Telephone Encounter (Signed)
Please call in Zofran 8 mg ODT Q8 Hrs prn #20, NR RG

## 2022-05-04 MED ORDER — ONDANSETRON 8 MG PO TBDP: 8.0000 mg | ORAL_TABLET | Freq: Three times a day (TID) | ORAL | 0 refills | Status: DC | PRN

## 2022-05-04 NOTE — Telephone Encounter (Signed)
Done

## 2022-05-07 ENCOUNTER — Telehealth: Payer: Self-pay

## 2022-05-07 NOTE — Telephone Encounter (Signed)
Donita Brooks from [facility] said patient cancelled [place] due to cost

## 2022-05-10 ENCOUNTER — Ambulatory Visit (HOSPITAL_COMMUNITY): Payer: 59

## 2022-06-25 ENCOUNTER — Telehealth: Payer: Self-pay | Admitting: Gastroenterology

## 2022-06-25 NOTE — Telephone Encounter (Signed)
Patient would like a call back to discuss [place] appt at an outpatient facility

## 2022-06-28 NOTE — Telephone Encounter (Signed)
Pt stated that she has been having abdominal pain on and off  for the last 2 weeks: Pt states that she feels constipated and have only small bm lately that are " small hard balls" Pt was encouraged to take Miralax twice a day until a large BM and then once a day until she feels that she is back to normal.Pt encouraged to increase water intake: Pt was notified to call our office back in a couple days if she is not feeling better and the constipation is not any  better: Pt verbalized understanding with all questions answered.

## 2022-06-28 NOTE — Telephone Encounter (Signed)
Patient said that she has been having flare up from her diverticulitis for 2 weeks that has been off and on and has been been in pain all weekend and is seeking advise. Please advise  Patient will call [facility] to schedule [place] at [facility]

## 2022-07-08 ENCOUNTER — Other Ambulatory Visit: Payer: 59

## 2022-07-12 ENCOUNTER — Ambulatory Visit
Admission: RE | Admit: 2022-07-12 | Discharge: 2022-07-12 | Disposition: A | Discharge status: Home / Self Care | Payer: 59 | Source: Ambulatory Visit | Attending: Gastroenterology | Admitting: Gastroenterology

## 2022-07-12 ENCOUNTER — Other Ambulatory Visit: Payer: Self-pay | Admitting: Obstetrics and Gynecology

## 2022-07-12 DIAGNOSIS — R1011 Right upper quadrant pain: Secondary | ICD-10-CM | POA: Insufficient documentation

## 2022-07-12 DIAGNOSIS — K5732 Diverticulitis of large intestine without perforation or abscess without bleeding: Secondary | ICD-10-CM | POA: Diagnosis present

## 2022-07-12 DIAGNOSIS — K581 Irritable bowel syndrome with constipation: Secondary | ICD-10-CM | POA: Diagnosis present

## 2022-07-12 DIAGNOSIS — Z1231 Encounter for screening mammogram for malignant neoplasm of breast: Secondary | ICD-10-CM

## 2022-07-20 ENCOUNTER — Other Ambulatory Visit: Payer: Self-pay

## 2022-07-20 DIAGNOSIS — R110 Nausea: Secondary | ICD-10-CM

## 2022-07-20 MED ORDER — ONDANSETRON 4 MG PO TBDP: 4.0000 mg | ORAL_TABLET | Freq: Three times a day (TID) | ORAL | 0 refills | Status: DC | PRN

## 2022-08-16 ENCOUNTER — Ambulatory Visit
Admission: RE | Admit: 2022-08-16 | Discharge: 2022-08-16 | Disposition: A | Discharge status: Home / Self Care | Payer: 59 | Source: Ambulatory Visit | Attending: Obstetrics and Gynecology | Admitting: Obstetrics and Gynecology

## 2022-08-16 DIAGNOSIS — Z1231 Encounter for screening mammogram for malignant neoplasm of breast: Secondary | ICD-10-CM | POA: Insufficient documentation

## 2023-01-25 ENCOUNTER — Ambulatory Visit: Payer: 59 | Admitting: Gastroenterology

## 2023-01-25 ENCOUNTER — Encounter: Payer: Self-pay | Admitting: Gastroenterology

## 2023-01-25 ENCOUNTER — Other Ambulatory Visit: Payer: 59

## 2023-01-25 VITALS — BP 128/92 | HR 93 | Ht 59.0 in | Wt 225.2 lb

## 2023-01-25 DIAGNOSIS — R1011 Right upper quadrant pain: Secondary | ICD-10-CM | POA: Diagnosis not present

## 2023-01-25 DIAGNOSIS — K582 Mixed irritable bowel syndrome: Secondary | ICD-10-CM

## 2023-01-25 DIAGNOSIS — Z8719 Personal history of other diseases of the digestive system: Secondary | ICD-10-CM

## 2023-01-25 DIAGNOSIS — K219 Gastro-esophageal reflux disease without esophagitis: Secondary | ICD-10-CM

## 2023-01-25 MED ORDER — POTASSIUM CHLORIDE CRYS ER 20 MEQ PO TBCR: 20.0000 meq | EXTENDED_RELEASE_TABLET | Freq: Every day | ORAL | 2 refills | Status: DC

## 2023-01-25 NOTE — Progress Notes (Signed)
Chief Complaint: FU  Referring Provider:  Lynnell Jude, MD      ASSESSMENT AND PLAN;   #1.  LUQ Pain  #2. H/O Recurrent descending colon diverticulitis  -Adm 1/26- 12/28/2022 for desc colon diverticulitis without abscess, treated with Augmentin -Adm 11/30-12/03/2020 to [facility] with 2.4 cm div abscess, Rx with Levaquin/Flagyl. Resolved. Neg CT 03/2021 -01/19/2019 on CT at [facility]. Rx with augmentin.  -Neg colon 09/2018 except for pancolonic diverticulosis.  #2. IBS-C (with alt diarrhea) #3. GERD   Plan: -CBC, CMP, lipase  -Benefiber 1 TBS po QD with 8oz water. -Miralax 17g po QD PRN (No BM x 48 hrs or pellet-like stools) -Avoid NSAIDs. -Colon April 2024 with miralax -Kdur '20mg'$  po QD #30 2RF -Start exercising (walking) 30 min/day -Increase water intake -If still with problems, trial of bentyl  HPI:    Christina Potts is a 52 y.o. female  Works in the lab Engineer, building services) at [employer]  With over [place] H/O abdo pain with alternating constipation and rare diarrhea.  Diagnosed with IBS.  FU from Adm 1/26- 12/28/2022 with Acute Uncomplicated descending colon diverticulitis: Presented with LLQ and LUQ pain consistent with prior episode of diverticulitis in 2021. KUB with nonobstructive bowel gas pattern. CT Abd/Pelvis confirmed acute diverticulitis of descending colon. Pt treated with Zosyn with transition to Augmentin on 1/28 to complete 10 day course.   For FU Doing better Still with persistent LUQ/LMQ Abdo pain Has been constipated with pellet-like stools.  No further fever or chills.  Denies having any upper GI symptoms including nausea, vomiting, heartburn, regurgitation.  No odynophagia or dysphagia.  No sodas, chocolates, chewing gums, artificial sweeteners and candy. No NSAIDs.    On review of previous records:  - ED visit to [facility] Feb 2020 with LLQ pain. CT AP- descending colon diverticulitis without abscess. Rxed with Augmentin 875  BID  -Adm 10/28/2020- 11/02/2020 [facility]). Reviewed: Patient with a 6 day history of abdominal pain, nausea, hematochezia, trouble with PO intake, and fever (Tmax 102.5). Long history diverticular disease with multiple flares. On presentation, leukocytosis noted at 14.3. Colonic abscess on CT measuring 2.4cm. No perf or obstruction. Consulted surgery who recommended serial abdominal exams and consult IR given that she does not currently have an acute abdomen. IR consulted, abscess currently too small for percutaneous drainage. Given the size of the abscess as <4cm, continued medical management with Zosyn, IVFs, bowel rest and symptom control. Patient's symptoms continued to improve and her diet was advanced. On day 4 of hospitalization, repeat CT abdomen showed improvement of inflammatory changes and no readily apparent abscess.Pt transitioned to oral Levaquin and metronidazole which she will continue on discharge.   Past GI work-up:  CT AP with contrast 12/25/2022 FINDINGS: LOWER CHEST: Dependent subsegmental atelectasis in the visualized lung bases. No pleural or pericardial effusion. LIVER: Normal liver contour. No focal liver lesions. BILIARY: The gallbladder is normal in appearance. No biliary ductal dilatation. SPLEEN: Normal in size and contour. Subcentimeter hypoattenuating lesion in the anterior spleen too small to characterize, unchanged. PANCREAS: Normal pancreatic contour. No focal lesions. No ductal dilation. ADRENAL GLANDS: Normal appearance of the adrenal glands. KIDNEYS/URETERS: Symmetric renal enhancement. No hydronephrosis. No solid renal mass. BLADDER: Unremarkable. REPRODUCTIVE ORGANS: Uterus is surgically absent. No suspicious adnexal lesions. GI TRACT: The stomach appears unremarkable. The small bowel is normal in caliber. There is a long segment of circumferentially thickened descending colon with associated pericolonic inflammatory stranding and small volume free fluid  tracking along  the left paracolic gutter ([id number], [id number]). No diverticular abscess. PERITONEUM, RETROPERITONEUM AND MESENTERY: No free air. Small volume free fluid tracking along left paracolic gutter. LYMPH NODES: No adenopathy. VESSELS: Hepatic and portal veins are patent. Normal caliber aorta. BONES and SOFT TISSUES: Mild degenerative changes of the visualized spine. Posterior spinal fusion hardware with interbody spacer at L4-L5. Small fat-containing umbilical hernia.   EGD 09/2018 - Mild Gastritis. Bx neg for HP, neg SB bx for celiac  Colonoscopy 09/2018 -Pancolonic diverticulosis predominantly in the sigmoid colon. -Small internal hemorrhoids (likely etiology of rectal bleeding, no bleeding currently) -Otherwise normal colonoscopy to TI. -Rpt 10 yrs. Earlier if any new problems or change in family history.  CT AP with contrast 04/02/2021 IMPRESSION: 1. No acute abdominopelvic findings. 2. Colonic diverticulosis without findings of acute diverticulitis. Past Medical History:  Diagnosis Date   ADD (attention deficit disorder)    Anemia    as child   Anxiety    Arthritis    Asthma    Bowel obstruction ([place])    BRCA negative 01/2013, 9/19   BRCA/Colaris neg; MyRisk neg 9/19   Chronic headaches    Colon polyps    2010-2011   DDD (degenerative disc disease), cervical    Depression    Diverticulitis    Diverticulosis    Family history of breast cancer 07/2018   IBIS=12.4%   GERD (gastroesophageal reflux disease)    History of snoring    Hypertension    IBS (irritable bowel syndrome)    Migraines    had one 2 weeks ago   Pneumonia    2014   PONV (postoperative nausea and vomiting)    Spondylolisthesis of lumbar region     Past Surgical History:  Procedure Laterality Date   ABDOMINAL HYSTERECTOMY  11/08/2000   TAH RSO, LOA due to pelvic pain/menometrorrhagia. Dr. Ammie Dalton. Partical   ANTERIOR CERVICAL DECOMP/DISCECTOMY FUSION N/A 05/25/2018   Procedure: ANTERIOR CERVICAL  DECOMPRESSION FUSION CERVICAL FOUR-FIVE,CERVICAL FIVE-SIX,CERVICAL SIX-SEVEN.;  Surgeon: Eustace Moore, MD;  Location: [facility];  Service: Neurosurgery;  Laterality: N/A;  right side approach   BACK SURGERY  08/2017   Lumbar surgery with Dr. Sherley Bounds   CARPAL TUNNEL RELEASE Left    CESAREAN SECTION     X 2   COLONOSCOPY     2009   ESOPHAGOGASTRODUODENOSCOPY  02/13/2009   Minimal gastritis. Otherwise normal EGD.    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     WRIST SURGERY Left    Cartlige bremoved    Family History  Problem Relation Age of Onset   Irritable bowel syndrome Mother    Lung cancer Maternal Aunt        65s   Diabetes Maternal Aunt    Lung cancer Maternal Aunt    Breast cancer Paternal Aunt    Prostate cancer Paternal Uncle    Liver cancer Maternal Grandmother        liver disease   Diabetes Maternal Grandmother    Kidney disease Maternal Grandfather    Diabetes Maternal Grandfather    Liver disease Maternal Grandfather    Ovarian cancer Cousin    Breast cancer Cousin    Brain cancer Cousin    Stomach cancer Cousin    Aneurysm Cousin    Colon cancer Cousin    Ovarian cancer Other    Breast cancer Other        5 cousins, 3-50 yrs old  Esophageal cancer Neg Hx    Pancreatic cancer Neg Hx     Social History   Tobacco Use   Smoking status: Never   Smokeless tobacco: Never  Vaping Use   Vaping Use: Never used  Substance Use Topics   Alcohol use: Not Currently    Comment: rare   Drug use: No    Current Outpatient Medications  Medication Sig Dispense Refill   albuterol (VENTOLIN HFA) 108 (90 Base) MCG/ACT inhaler Inhale 2 puffs into the lungs every 4 (four) hours as needed for wheezing or shortness of breath. 18 g 0   buPROPion (WELLBUTRIN XL) 300 MG 24 hr tablet Take 300 mg by mouth daily.     dexlansoprazole (DEXILANT) 60 MG capsule Take 60 mg by mouth 2 (two) times a week.     levalbuterol (XOPENEX HFA) 45 MCG/ACT inhaler  SMARTSIG:2 Puff(s) Via Inhaler 4 Times Daily PRN     losartan-hydrochlorothiazide (HYZAAR) 50-12.5 MG per tablet Take 1 tablet by mouth at bedtime.      methylphenidate (RITALIN) 10 MG tablet Take 20 mg by mouth 2 (two) times daily.     ondansetron (ZOFRAN-ODT) 4 MG disintegrating tablet Take 1 tablet (4 mg total) by mouth every 8 (eight) hours as needed for nausea or vomiting. 30 tablet 0   ondansetron (ZOFRAN-ODT) 8 MG disintegrating tablet Take 1 tablet (8 mg total) by mouth every 8 (eight) hours as needed for nausea or vomiting. 20 tablet 0   polyethylene glycol (MIRALAX) 17 g packet Take 17 g by mouth as needed for moderate constipation or mild constipation.     No current facility-administered medications for this visit.    Allergies  Allergen Reactions   Dilaudid [Hydromorphone Hcl] Anaphylaxis and Shortness Of Breath    Patient reports 09/21/17 she tolerates oxycodone fine   Strawberry (Diagnostic) Anaphylaxis, Hives and Swelling   Morphine And Related Rash and Other (See Comments)    Burns my skin     Review of Systems:  neg     Physical Exam:    BP (!) 128/94   Pulse 93   Ht '4\' 11"'$  (1.499 m)   Wt 225 lb 4 oz (102.2 kg)   SpO2 96%   BMI 45.49 kg/m  Filed Weights   01/25/23 1436  Weight: 225 lb 4 oz (102.2 kg)   Constitutional:  Well-developed, in no acute distress. Psychiatric: Normal mood and affect. Behavior is normal. HEENT: Pupils normal.  Conjunctivae are normal. No scleral icterus. Neck supple.  Well-healed surgical scar. Cardiovascular: Normal rate, regular rhythm. No edema Pulmonary/chest: Effort normal and breath sounds normal. No wheezing, rales or rhonchi. Abdominal: Soft, nondistended. LUQ muscle tenderness without rebound..  Bowel sounds active throughout. There are no masses palpable. No hepatomegaly. Neurological: Alert and oriented to person place and time. Skin: Skin is warm and dry. No rashes noted.  Data Reviewed: I have personally reviewed  following labs and imaging studies  CBC:    Latest Ref Rng & Units 04/29/2022   11:39 AM 03/11/2021   10:28 AM 01/24/2019    9:45 AM  CBC  WBC 3.8 - 10.8 Thousand/uL 8.8  9.0  7.7   Hemoglobin 11.7 - 15.5 g/dL 15.1  15.9  15.5   Hematocrit 35.0 - 45.0 % 44.5  47.3  44.7   Platelets 140 - 400 Thousand/uL 297  334  365     CMP:    Latest Ref Rng & Units 04/29/2022   11:39 AM 03/11/2021   10:28  AM 07/26/2018    2:20 PM  CMP  Glucose 65 - 99 mg/dL 88  89  92   BUN 7 - 25 mg/dL '11  17  6   '$ Creatinine 0.50 - 0.99 mg/dL 0.90  0.96  0.86   Sodium 135 - 146 mmol/L 137  138  142   Potassium 3.5 - 5.3 mmol/L 4.2  4.3  4.1   Chloride 98 - 110 mmol/L 102  99  101   CO2 20 - 32 mmol/L '26  27  24   '$ Calcium 8.6 - 10.2 mg/dL 8.9  9.3  9.1   Total Protein 6.1 - 8.1 g/dL 6.4  6.8  6.4   Total Bilirubin 0.2 - 1.2 mg/dL 0.3  0.4  0.2   Alkaline Phos 39 - 117 IU/L   78   AST 10 - 35 U/L '18  18  17   '$ ALT 6 - 29 U/L '18  18  19         '$ Carmell Austria, MD 01/25/2023, 2:40 PM  Cc: Lynnell Jude, MD

## 2023-01-25 NOTE — Patient Instructions (Addendum)
_______________________________________________________  If your blood pressure at your visit was 140/90 or greater, please contact your primary care physician to follow up on this.  _______________________________________________________  If you are age 50 or older, your body mass index should be between 23-30. Your Body mass index is 45.49 kg/m. If this is out of the aforementioned range listed, please consider follow up with your Primary Care Provider.  If you are age 5 or younger, your body mass index should be between 19-25. Your Body mass index is 45.49 kg/m. If this is out of the aformentioned range listed, please consider follow up with your Primary Care Provider.   ________________________________________________________  The [facility] providers would like to encourage you to use [facility] to communicate with providers for non-urgent requests or questions.  Due to long hold times on the telephone, sending your provider a message by [facility] may be a faster and more efficient way to get a response.  Please allow 48 business hours for a response.  Please remember that this is for non-urgent requests.  _______________________________________________________  Dennis Bast have been given your lab orders to [facility]  Please purchase the following medications over the counter and take as directed: Miralax 17g daily Benefiber 1 tablespoon in 8oz daily  No NSAID's  We have sent the following medications to your pharmacy for you to pick up at your convenience: Potassium 66mq daily  Start exercising 30 minutes daily  Increase water intake  You have been scheduled for a colonoscopy. Please follow written instructions given to you at your visit today.  Please pick up your prep supplies at the pharmacy within the next 1-3 days. If you use inhalers (even only as needed), please bring them with you on the day of your procedure.  Call with any questions or concerns.  Thank  you,  Dr. RJackquline Denmark

## 2023-01-26 LAB — CBC WITH DIFFERENTIAL/PLATELET
Absolute Monocytes: 540 cells/uL (ref 200–950)
Basophils Absolute: 36 cells/uL (ref 0–200)
Basophils Relative: 0.4 %
Eosinophils Absolute: 90 cells/uL (ref 15–500)
Eosinophils Relative: 1 %
HCT: 44.1 % (ref 35.0–45.0)
Hemoglobin: 15.5 g/dL (ref 11.7–15.5)
Lymphs Abs: 1962 cells/uL (ref 850–3900)
MCH: 31.2 pg (ref 27.0–33.0)
MCHC: 35.1 g/dL (ref 32.0–36.0)
MCV: 88.7 fL (ref 80.0–100.0)
MPV: 11.1 fL (ref 7.5–12.5)
Monocytes Relative: 6 %
Neutro Abs: 6372 cells/uL (ref 1500–7800)
Neutrophils Relative %: 70.8 %
Platelets: 355 10*3/uL (ref 140–400)
RBC: 4.97 10*6/uL (ref 3.80–5.10)
RDW: 12.4 % (ref 11.0–15.0)
Total Lymphocyte: 21.8 %
WBC: 9 10*3/uL (ref 3.8–10.8)

## 2023-01-26 LAB — COMPREHENSIVE METABOLIC PANEL
AG Ratio: 1.4 (calc) (ref 1.0–2.5)
ALT: 24 U/L (ref 6–29)
AST: 22 U/L (ref 10–35)
Albumin: 4 g/dL (ref 3.6–5.1)
Alkaline phosphatase (APISO): 94 U/L (ref 31–125)
BUN: 13 mg/dL (ref 7–25)
CO2: 24 mmol/L (ref 20–32)
Calcium: 9.1 mg/dL (ref 8.6–10.2)
Chloride: 101 mmol/L (ref 98–110)
Creat: 0.84 mg/dL (ref 0.50–0.99)
Globulin: 2.9 g/dL (calc) (ref 1.9–3.7)
Glucose, Bld: 95 mg/dL (ref 65–99)
Potassium: 3.8 mmol/L (ref 3.5–5.3)
Sodium: 138 mmol/L (ref 135–146)
Total Bilirubin: 0.3 mg/dL (ref 0.2–1.2)
Total Protein: 6.9 g/dL (ref 6.1–8.1)

## 2023-01-26 LAB — LIPASE: Lipase: 13 U/L (ref 7–60)

## 2023-01-27 ENCOUNTER — Telehealth: Payer: Self-pay | Admitting: Gastroenterology

## 2023-01-27 NOTE — Telephone Encounter (Signed)
Pt stated that she recently had office visit with Dr. Lyndel Safe and she feels that her pain is progressively getting worse, having Left abdomen pain, spasms, flutters, nausea, last BM this AM pencil thin, Pt stated that her WBC increased from 6 to 9 recently and she feels that she diverticulitis and does not want to have to go back to hospital. Pt requesting antibiotics. Please advise

## 2023-01-27 NOTE — Telephone Encounter (Signed)
Inbound call from patient, states since her appointment 2/27. Her abdominal pains have not improved. She wants to be prescribed some pain or antibiotic medication. She states she feels flutters as well, is requesting to speak with a nurse to further advise.

## 2023-01-27 NOTE — Telephone Encounter (Signed)
Please give her Levaquin 500 mg QD, Flagyl 500 TID x 10 days. If still with problems, needs repeat CT Abdo/pelvis with contrast RG

## 2023-01-28 ENCOUNTER — Other Ambulatory Visit: Payer: Self-pay

## 2023-01-28 DIAGNOSIS — Z8719 Personal history of other diseases of the digestive system: Secondary | ICD-10-CM

## 2023-01-28 DIAGNOSIS — R109 Unspecified abdominal pain: Secondary | ICD-10-CM

## 2023-01-28 DIAGNOSIS — K5732 Diverticulitis of large intestine without perforation or abscess without bleeding: Secondary | ICD-10-CM

## 2023-01-28 DIAGNOSIS — R110 Nausea: Secondary | ICD-10-CM

## 2023-01-28 DIAGNOSIS — R1011 Right upper quadrant pain: Secondary | ICD-10-CM

## 2023-01-28 MED ORDER — METRONIDAZOLE 500 MG PO TABS: 500.0000 mg | ORAL_TABLET | Freq: Three times a day (TID) | ORAL | 0 refills | Status: AC

## 2023-01-28 MED ORDER — LEVOFLOXACIN 500 MG PO TABS: 500.0000 mg | ORAL_TABLET | Freq: Every day | ORAL | 0 refills | Status: AC

## 2023-01-28 NOTE — Telephone Encounter (Signed)
Patient is returning your call.  

## 2023-01-28 NOTE — Telephone Encounter (Signed)
Unable to reach pt or leave voice mail due  to pt voice mailbox being full.

## 2023-01-28 NOTE — Telephone Encounter (Signed)
Pt made aware of Dr. Lyndel Safe recommendations.  Prescriptions sent to pharmacy. Pt made aware Pt verbalized understanding with all questions answered.

## 2023-03-07 ENCOUNTER — Encounter: Payer: Self-pay | Admitting: Gastroenterology

## 2023-03-15 ENCOUNTER — Encounter: Payer: Self-pay | Admitting: Gastroenterology

## 2023-03-15 ENCOUNTER — Ambulatory Visit (AMBULATORY_SURGERY_CENTER): Payer: 59 | Admitting: Gastroenterology

## 2023-03-15 VITALS — BP 146/94 | HR 77 | Temp 98.4°F | Resp 14 | Ht 59.0 in | Wt 225.0 lb

## 2023-03-15 DIAGNOSIS — K582 Mixed irritable bowel syndrome: Secondary | ICD-10-CM | POA: Diagnosis not present

## 2023-03-15 DIAGNOSIS — Z8719 Personal history of other diseases of the digestive system: Secondary | ICD-10-CM

## 2023-03-15 MED ORDER — SODIUM CHLORIDE 0.9 % IV SOLN: 500.0000 mL | Freq: Once | INTRAVENOUS | Status: DC

## 2023-03-15 NOTE — Progress Notes (Signed)
Chief Complaint: FU  Referring Provider:  Lynnell Jude, MD      ASSESSMENT AND PLAN;   #1.  LUQ Pain  #2. H/O Recurrent descending colon diverticulitis  -Adm 1/26- 12/28/2022 for desc colon diverticulitis without abscess, treated with Augmentin -Adm 11/30-12/03/2020 to [facility] with 2.4 cm div abscess, Rx with Levaquin/Flagyl. Resolved. Neg CT 03/2021 -01/19/2019 on CT at [facility]. Rx with augmentin.  -Neg colon 09/2018 except for pancolonic diverticulosis.  #2. IBS-C (with alt diarrhea) #3. GERD   Plan: -CBC, CMP, lipase  -Benefiber 1 TBS po QD with 8oz water. -Miralax 17g po QD PRN (No BM x 48 hrs or pellet-like stools) -Avoid NSAIDs. -Colon April 2024 with miralax -Kdur '20mg'$  po QD #30 2RF -Start exercising (walking) 30 min/day -Increase water intake -If still with problems, trial of bentyl  HPI:    Christina Potts is a 52 y.o. female  Works in the lab Engineer, building services) at [employer]  With over [place] H/O abdo pain with alternating constipation and rare diarrhea.  Diagnosed with IBS.  FU from Adm 1/26- 12/28/2022 with Acute Uncomplicated descending colon diverticulitis: Presented with LLQ and LUQ pain consistent with prior episode of diverticulitis in 2021. KUB with nonobstructive bowel gas pattern. CT Abd/Pelvis confirmed acute diverticulitis of descending colon. Pt treated with Zosyn with transition to Augmentin on 1/28 to complete 10 day course.   For FU Doing better Still with persistent LUQ/LMQ Abdo pain Has been constipated with pellet-like stools.  No further fever or chills.  Denies having any upper GI symptoms including nausea, vomiting, heartburn, regurgitation.  No odynophagia or dysphagia.  No sodas, chocolates, chewing gums, artificial sweeteners and candy. No NSAIDs.    On review of previous records:  - ED visit to [facility] Feb 2020 with LLQ pain. CT AP- descending colon diverticulitis without abscess. Rxed with Augmentin 875  BID  -Adm 10/28/2020- 11/02/2020 [facility]). Reviewed: Patient with a 6 day history of abdominal pain, nausea, hematochezia, trouble with PO intake, and fever (Tmax 102.5). Long history diverticular disease with multiple flares. On presentation, leukocytosis noted at 14.3. Colonic abscess on CT measuring 2.4cm. No perf or obstruction. Consulted surgery who recommended serial abdominal exams and consult IR given that she does not currently have an acute abdomen. IR consulted, abscess currently too small for percutaneous drainage. Given the size of the abscess as <4cm, continued medical management with Zosyn, IVFs, bowel rest and symptom control. Patient's symptoms continued to improve and her diet was advanced. On day 4 of hospitalization, repeat CT abdomen showed improvement of inflammatory changes and no readily apparent abscess.Pt transitioned to oral Levaquin and metronidazole which she will continue on discharge.   Past GI work-up:  CT AP with contrast 12/25/2022 FINDINGS: LOWER CHEST: Dependent subsegmental atelectasis in the visualized lung bases. No pleural or pericardial effusion. LIVER: Normal liver contour. No focal liver lesions. BILIARY: The gallbladder is normal in appearance. No biliary ductal dilatation. SPLEEN: Normal in size and contour. Subcentimeter hypoattenuating lesion in the anterior spleen too small to characterize, unchanged. PANCREAS: Normal pancreatic contour. No focal lesions. No ductal dilation. ADRENAL GLANDS: Normal appearance of the adrenal glands. KIDNEYS/URETERS: Symmetric renal enhancement. No hydronephrosis. No solid renal mass. BLADDER: Unremarkable. REPRODUCTIVE ORGANS: Uterus is surgically absent. No suspicious adnexal lesions. GI TRACT: The stomach appears unremarkable. The small bowel is normal in caliber. There is a long segment of circumferentially thickened descending colon with associated pericolonic inflammatory stranding and small volume free fluid  tracking along  the left paracolic gutter (5:78, 2:44). No diverticular abscess. PERITONEUM, RETROPERITONEUM AND MESENTERY: No free air. Small volume free fluid tracking along left paracolic gutter. LYMPH NODES: No adenopathy. VESSELS: Hepatic and portal veins are patent. Normal caliber aorta. BONES and SOFT TISSUES: Mild degenerative changes of the visualized spine. Posterior spinal fusion hardware with interbody spacer at L4-L5. Small fat-containing umbilical hernia.   EGD 09/2018 - Mild Gastritis. Bx neg for HP, neg SB bx for celiac  Colonoscopy 09/2018 -Pancolonic diverticulosis predominantly in the sigmoid colon. -Small internal hemorrhoids (likely etiology of rectal bleeding, no bleeding currently) -Otherwise normal colonoscopy to TI. -Rpt 10 yrs. Earlier if any new problems or change in family history.  CT AP with contrast 04/02/2021 IMPRESSION: 1. No acute abdominopelvic findings. 2. Colonic diverticulosis without findings of acute diverticulitis. Past Medical History:  Diagnosis Date   ADD (attention deficit disorder)    Anemia    as child   Anxiety    Arthritis    Asthma    Bowel obstruction    BRCA negative 01/2013, 9/19   BRCA/Colaris neg; MyRisk neg 9/19   Chronic headaches    Colon polyps    2010-2011   DDD (degenerative disc disease), cervical    Depression    Diverticulitis    Diverticulosis    Family history of breast cancer 07/2018   IBIS=12.4%   GERD (gastroesophageal reflux disease)    History of snoring    Hypertension    IBS (irritable bowel syndrome)    Migraines    had one 2 weeks ago   Pneumonia    2014   PONV (postoperative nausea and vomiting)    Spondylolisthesis of lumbar region     Past Surgical History:  Procedure Laterality Date   ABDOMINAL HYSTERECTOMY  11/08/2000   TAH RSO, LOA due to pelvic pain/menometrorrhagia. Dr. Arvil Chaco. Partical   ANTERIOR CERVICAL DECOMP/DISCECTOMY FUSION N/A 05/25/2018   Procedure: ANTERIOR CERVICAL  DECOMPRESSION FUSION CERVICAL FOUR-FIVE,CERVICAL FIVE-SIX,CERVICAL SIX-SEVEN.;  Surgeon: Tia Alert, MD;  Location: [facility] OR;  Service: Neurosurgery;  Laterality: N/A;  right side approach   BACK SURGERY  08/2017   Lumbar surgery with Dr. Marikay Alar   CARPAL TUNNEL RELEASE Left    CESAREAN SECTION     X 2   COLONOSCOPY     2009   ESOPHAGOGASTRODUODENOSCOPY  02/13/2009   Minimal gastritis. Otherwise normal EGD.    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     WRIST SURGERY Left    Cartlige bremoved    Family History  Problem Relation Age of Onset   Irritable bowel syndrome Mother    Lung cancer Maternal Aunt        63s   Diabetes Maternal Aunt    Lung cancer Maternal Aunt    Breast cancer Paternal Aunt    Prostate cancer Paternal Uncle    Liver cancer Maternal Grandmother        liver disease   Diabetes Maternal Grandmother    Kidney disease Maternal Grandfather    Diabetes Maternal Grandfather    Liver disease Maternal Grandfather    Ovarian cancer Cousin    Breast cancer Cousin    Brain cancer Cousin    Stomach cancer Cousin    Aneurysm Cousin    Colon cancer Cousin    Ovarian cancer Other    Breast cancer Other        5 cousins, 75-50 yrs old   Esophageal  cancer Neg Hx    Pancreatic cancer Neg Hx     Social History   Tobacco Use   Smoking status: Never   Smokeless tobacco: Never  Vaping Use   Vaping Use: Never used  Substance Use Topics   Alcohol use: Not Currently    Comment: rare   Drug use: No    Current Outpatient Medications  Medication Sig Dispense Refill   albuterol (VENTOLIN HFA) 108 (90 Base) MCG/ACT inhaler Inhale 2 puffs into the lungs every 4 (four) hours as needed for wheezing or shortness of breath. 18 g 0   buPROPion (WELLBUTRIN XL) 300 MG 24 hr tablet Take 300 mg by mouth daily.     dexlansoprazole (DEXILANT) 60 MG capsule Take 60 mg by mouth 2 (two) times a week.     levalbuterol (XOPENEX HFA) 45 MCG/ACT inhaler  SMARTSIG:2 Puff(s) Via Inhaler 4 Times Daily PRN     losartan-hydrochlorothiazide (HYZAAR) 50-12.5 MG per tablet Take 1 tablet by mouth at bedtime.      methylphenidate (RITALIN) 10 MG tablet Take 20 mg by mouth 2 (two) times daily.     ondansetron (ZOFRAN-ODT) 4 MG disintegrating tablet Take 1 tablet (4 mg total) by mouth every 8 (eight) hours as needed for nausea or vomiting. 30 tablet 0   ondansetron (ZOFRAN-ODT) 8 MG disintegrating tablet Take 1 tablet (8 mg total) by mouth every 8 (eight) hours as needed for nausea or vomiting. 20 tablet 0   polyethylene glycol (MIRALAX) 17 g packet Take 17 g by mouth as needed for moderate constipation or mild constipation.     potassium chloride SA (KLOR-CON M) 20 MEQ tablet Take 1 tablet (20 mEq total) by mouth daily. 30 tablet 2   No current facility-administered medications for this visit.    Allergies  Allergen Reactions   Dilaudid [Hydromorphone Hcl] Anaphylaxis and Shortness Of Breath    Patient reports 09/21/17 she tolerates oxycodone fine   Strawberry (Diagnostic) Anaphylaxis, Hives and Swelling   Morphine And Related Rash and Other (See Comments)    Burns my skin     Review of Systems:  neg     Physical Exam:    BP (!) 144/86   Pulse 94   Temp 98.4 F (36.9 C)   Ht 4\' 11"  (1.499 m)   Wt 225 lb (102.1 kg)   SpO2 100%   BMI 45.44 kg/m  Filed Weights   03/15/23 1346  Weight: 225 lb (102.1 kg)   Constitutional:  Well-developed, in no acute distress. Psychiatric: Normal mood and affect. Behavior is normal. HEENT: Pupils normal.  Conjunctivae are normal. No scleral icterus. Neck supple.  Well-healed surgical scar. Cardiovascular: Normal rate, regular rhythm. No edema Pulmonary/chest: Effort normal and breath sounds normal. No wheezing, rales or rhonchi. Abdominal: Soft, nondistended. LUQ muscle tenderness without rebound..  Bowel sounds active throughout. There are no masses palpable. No hepatomegaly. Neurological: Alert and  oriented to person place and time. Skin: Skin is warm and dry. No rashes noted.  Data Reviewed: I have personally reviewed following labs and imaging studies  CBC:    Latest Ref Rng & Units 01/25/2023    3:39 PM 04/29/2022   11:39 AM 03/11/2021   10:28 AM  CBC  WBC 3.8 - 10.8 Thousand/uL 9.0  8.8  9.0   Hemoglobin 11.7 - 15.5 g/dL 16.1  09.6  04.5   Hematocrit 35.0 - 45.0 % 44.1  44.5  47.3   Platelets 140 - 400 Thousand/uL 355  297  334     CMP:    Latest Ref Rng & Units 01/25/2023    3:39 PM 04/29/2022   11:39 AM 03/11/2021   10:28 AM  CMP  Glucose 65 - 99 mg/dL 95  88  89   BUN 7 - 25 mg/dL Creatinine 0.50 - 0.99 mg/dL 1.61  0.96  0.45   Sodium 135 - 146 mmol/L 138  137  138   Potassium 3.5 - 5.3 mmol/L 3.8  4.2  4.3   Chloride 98 - 110 mmol/L 101  102  99   CO2 20 - 32 mmol/L Calcium 8.6 - 10.2 mg/dL 9.1  8.9  9.3   Total Protein 6.1 - 8.1 g/dL 6.9  6.4  6.8   Total Bilirubin 0.2 - 1.2 mg/dL 0.3  0.3  0.4   AST 10 - 35 U/L ALT 6 - 29 U/L Edman Circle, MD 03/15/2023, 1:59 PM  Cc: Dortha Kern, MD

## 2023-03-15 NOTE — Op Note (Signed)
[facility] Patient Name: Christina Potts Procedure Date: 03/15/2023 2:42 PM MRN: [id number] Endoscopist: Lynann Bologna , MD, [id number] Age: 52 Referring MD:  Date of Birth: [birth year] Gender: Female Account #: [id number] Procedure:                Colonoscopy Indications:              #1. LUQ Pain                           #2. H/O Recurrent descending colon diverticulitis Medicines:                Monitored Anesthesia Care Procedure:                Pre-Anesthesia Assessment:                           - Prior to the procedure, a History and Physical                            was performed, and patient medications and                            allergies were reviewed. The patient's tolerance of                            previous anesthesia was also reviewed. The risks                            and benefits of the procedure and the sedation                            options and risks were discussed with the patient.                            All questions were answered, and informed consent                            was obtained. Prior Anticoagulants: The patient has                            taken no anticoagulant or antiplatelet agents. ASA                            Grade Assessment: II - A patient with mild systemic                            disease. After reviewing the risks and benefits,                            the patient was deemed in satisfactory condition to                            undergo the procedure.  After obtaining informed consent, the colonoscope                            was passed under direct vision. Throughout the                            procedure, the patient's blood pressure, pulse, and                            oxygen saturations were monitored continuously. The                            Olympus PCF-[id number] (#[id number]) Colonoscope was                            introduced through the anus and advanced to the 2                             cm into the ileum. The colonoscopy was performed                            without difficulty. The patient tolerated the                            procedure well. The quality of the bowel                            preparation was good. The terminal ileum, ileocecal                            valve, appendiceal orifice, and rectum were                            photographed. Scope In: 2:44:00 PM Scope Out: 2:59:57 PM Scope Withdrawal Time: 0 hours 11 minutes 17 seconds  Total Procedure Duration: 0 hours 15 minutes 57 seconds  Findings:                 Multiple medium-mouthed diverticula were found in                            the sigmoid colon, descending colon, transverse                            colon, ascending colon and one in cecum. There were                            mainly in the sigmoid colon giving it a "Swiss                            cheese appearance". Few diverticula with stool                            impacted. No endoscopic evidence of diverticulitis.  There were moderate diverticula also visualized in                            the ascending colon.                           The colon (entire examined portion) appeared                            normal. Biopsies for histology were taken with a                            cold forceps for evaluation of microscopic colitis.                           Non-bleeding internal hemorrhoids were found during                            retroflexion. The hemorrhoids were small and Grade                            I (internal hemorrhoids that do not prolapse).                           The terminal ileum appeared normal.                           The exam was otherwise without abnormality on                            direct and retroflexion views. Complications:            No immediate complications. Estimated Blood Loss:     Estimated blood loss: none. Impression:                - Moderate pancolonic diverticulosis predominantly                            in the sigmoid colon.                           - The entire examined colon is normal. Biopsied.                           - Non-bleeding internal hemorrhoids.                           - The examined portion of the ileum was normal.                           - The examination was otherwise normal on direct                            and retroflexion views. Recommendation:           - Patient has a contact number available for  emergencies. The signs and symptoms of potential                            delayed complications were discussed with the                            patient. Return to normal activities tomorrow.                            Written discharge instructions were provided to the                            patient.                           - High fiber diet.                           - Continue present medications.                           - Repeat colonoscopy in 10 years for screening                            purposes. Earlier, if with any new problems or                            change in family history.                           - If recurrent diverticulitis, will consider                            surgical referral. Certainly last resort.                           - The findings and recommendations were discussed                            with the patient's family. Lynann Bologna, MD 03/15/2023 3:06:35 PM This report has been signed electronically.

## 2023-03-15 NOTE — Progress Notes (Signed)
Pt's states no medical or surgical changes since previsit or office visit. 

## 2023-03-15 NOTE — Progress Notes (Signed)
Called to room to assist during endoscopic procedure.  Patient ID and intended procedure confirmed with present staff. Received instructions for my participation in the procedure from the performing physician.  

## 2023-03-15 NOTE — Patient Instructions (Signed)
Await pathology results.  Continue present medications. High fiber diet. Handouts on hemorrhoids, diverticulosis, and high fiber diet provided.   YOU HAD AN ENDOSCOPIC PROCEDURE TODAY AT THE [facility]:   Refer to the procedure report that was given to you for any specific questions about what was found during the examination.  If the procedure report does not answer your questions, please call your gastroenterologist to clarify.  If you requested that your care partner not be given the details of your procedure findings, then the procedure report has been included in a sealed envelope for you to review at your convenience later.  YOU SHOULD EXPECT: Some feelings of bloating in the abdomen. Passage of more gas than usual.  Walking can help get rid of the air that was put into your GI tract during the procedure and reduce the bloating. If you had a lower endoscopy (such as a colonoscopy or flexible sigmoidoscopy) you may notice spotting of blood in your stool or on the toilet paper. If you underwent a bowel prep for your procedure, you may not have a normal bowel movement for a few days.  Please Note:  You might notice some irritation and congestion in your nose or some drainage.  This is from the oxygen used during your procedure.  There is no need for concern and it should clear up in a day or so.  SYMPTOMS TO REPORT IMMEDIATELY:  Following lower endoscopy (colonoscopy or flexible sigmoidoscopy):  Excessive amounts of blood in the stool  Significant tenderness or worsening of abdominal pains  Swelling of the abdomen that is new, acute  Fever of 100F or higher   For urgent or emergent issues, a gastroenterologist can be reached at any hour by calling [phone]. Do not use MyChart messaging for urgent concerns.    DIET:  We do recommend a small meal at first, but then you may proceed to your regular diet.  Drink plenty of fluids but you should avoid alcoholic beverages  for 24 hours.  ACTIVITY:  You should plan to take it easy for the rest of today and you should NOT DRIVE or use heavy machinery until tomorrow (because of the sedation medicines used during the test).    FOLLOW UP: Our staff will call the number listed on your records the next business day following your procedure.  We will call around 7:15- 8:00 am to check on you and address any questions or concerns that you may have regarding the information given to you following your procedure. If we do not reach you, we will leave a message.     If any biopsies were taken you will be contacted by phone or by letter within the next 1-3 weeks.  Please call us at [phone] if you have not heard about the biopsies in 3 weeks.    SIGNATURES/CONFIDENTIALITY: You and/or your care partner have signed paperwork which will be entered into your electronic medical record.  These signatures attest to the fact that that the information above on your After Visit Summary has been reviewed and is understood.  Full responsibility of the confidentiality of this discharge information lies with you and/or your care-partner.

## 2023-03-15 NOTE — Progress Notes (Signed)
Uneventful anesthetic. Report to pacu rn. Vss. Care resumed by rn. 

## 2023-03-16 ENCOUNTER — Telehealth: Payer: Self-pay | Admitting: *Deleted

## 2023-03-16 NOTE — Telephone Encounter (Signed)
Post procedure follow up call placed, no answer and unable to leave message.

## 2023-03-21 ENCOUNTER — Encounter: Payer: Self-pay | Admitting: Gastroenterology

## 2023-05-25 ENCOUNTER — Other Ambulatory Visit: Payer: Self-pay | Admitting: Gastroenterology

## 2023-06-06 ENCOUNTER — Ambulatory Visit: Payer: 59 | Admitting: Obstetrics and Gynecology

## 2023-09-26 ENCOUNTER — Telehealth: Payer: Self-pay | Admitting: Gastroenterology

## 2023-09-26 NOTE — Telephone Encounter (Signed)
Inbound call from patient stating she is currently having a diverticulitis flare up since last week. States she is experiencing severe spasms in her abdomen. Also states she started to have a low grade fever today along with nausea. Patient requesting a call to discuss further. Please advise, thank you.

## 2023-09-27 ENCOUNTER — Other Ambulatory Visit: Payer: Self-pay

## 2023-09-27 DIAGNOSIS — R109 Unspecified abdominal pain: Secondary | ICD-10-CM

## 2023-09-27 DIAGNOSIS — K5732 Diverticulitis of large intestine without perforation or abscess without bleeding: Secondary | ICD-10-CM

## 2023-09-27 MED ORDER — AMOXICILLIN-POT CLAVULANATE 875-125 MG PO TABS
1.0000 | ORAL_TABLET | Freq: Two times a day (BID) | ORAL | 0 refills | Status: DC
Start: 2023-09-27 — End: 2024-08-15

## 2023-09-27 MED ORDER — DICYCLOMINE HCL 10 MG PO CAPS
10.0000 mg | ORAL_CAPSULE | Freq: Three times a day (TID) | ORAL | 0 refills | Status: DC
Start: 2023-09-27 — End: 2024-08-23

## 2023-09-27 NOTE — Telephone Encounter (Signed)
Please call in Augmentin 875 mg p.o. twice daily x 10 days Also Bentyl 10 mg p.o. 3 times daily x 10 days If worsens or does not get better over the next 48 hours, need to check CBC, CMP and a CT Abdo/pelvis with contrast. RG

## 2023-09-27 NOTE — Telephone Encounter (Signed)
Pt stated that she feels that she is having a diverticulitis flare up: Pt stated that last week she started having diarrhea  that turned to diarrhea/constipation and 3 days ago just constipation, 2 days ago she started have pain and tenderness in 5 or 6 spots in her stomach, yesterday she felt as she had a low grade temp due to her face being flush and her ears getting red. Last BM yesterday. Last office visit 01/25/2023 Please review and advise.

## 2023-09-27 NOTE — Telephone Encounter (Signed)
Pt made aware of Dr. Chales Abrahams recommendations: Prescription sent to pharmacy. Pt made aware. Pt notified to contact our office after 48 hours is symptoms worsen of do not approve.  Pt verbalized understanding with all questions answered.

## 2023-09-27 NOTE — Telephone Encounter (Signed)
Prescriptions sent to pharmacy Left message for pt to call back

## 2024-01-09 ENCOUNTER — Ambulatory Visit
Admission: RE | Admit: 2024-01-09 | Discharge: 2024-01-09 | Disposition: A | Discharge status: Home / Self Care | Payer: 59 | Attending: Family Medicine | Admitting: Family Medicine

## 2024-01-09 ENCOUNTER — Ambulatory Visit
Admission: RE | Admit: 2024-01-09 | Discharge: 2024-01-09 | Disposition: A | Discharge status: Home / Self Care | Payer: 59 | Source: Ambulatory Visit | Attending: Family Medicine | Admitting: Family Medicine

## 2024-01-09 ENCOUNTER — Other Ambulatory Visit: Payer: Self-pay | Admitting: Family Medicine

## 2024-01-09 DIAGNOSIS — R059 Cough, unspecified: Secondary | ICD-10-CM

## 2024-08-14 ENCOUNTER — Telehealth: Payer: Self-pay | Admitting: Gastroenterology

## 2024-08-14 ENCOUNTER — Other Ambulatory Visit: Payer: Self-pay | Admitting: Gastroenterology

## 2024-08-14 DIAGNOSIS — R110 Nausea: Secondary | ICD-10-CM

## 2024-08-14 DIAGNOSIS — K5732 Diverticulitis of large intestine without perforation or abscess without bleeding: Secondary | ICD-10-CM

## 2024-08-14 NOTE — Telephone Encounter (Signed)
 Inbound call from pt requesting the have a prescription for her diverticulitis flare as well as a prescription for her nausea that she has when she does have a flare up. Patient is requesting a call back to inform her of what prescription was being sent. Please advise.

## 2024-08-15 MED ORDER — ONDANSETRON 8 MG PO TBDP: 8.0000 mg | ORAL_TABLET | Freq: Three times a day (TID) | ORAL | 0 refills | Status: AC | PRN

## 2024-08-15 MED ORDER — AMOXICILLIN-POT CLAVULANATE 875-125 MG PO TABS: 1.0000 | ORAL_TABLET | Freq: Two times a day (BID) | ORAL | 0 refills | Status: DC

## 2024-08-15 NOTE — Telephone Encounter (Addendum)
 Spoke with patient. Patient reports that symptoms started last Thursday and Friday. Patient thought the abdominal pain could be due to constipation so she started Miralax over the weekend. Pain has worsened, patient describes as a sharp, pulsating pain that radiates to her back. Pain is predominantly left upper quadrant. Patient denies fever or chills. Patient would like to know if we can send in antibiotics for possible diverticulitis flare up. Patient has been scheduled for a follow up on Thursday, 08/23/24 at 3:20 pm with [facility], NP. Please advise, thanks.

## 2024-08-15 NOTE — Telephone Encounter (Signed)
 Inbound call from patient to follow up on previous message. Patient states she is need of medication and will be leaving for [place]  today and will be gone for a week. Requesting a call to discuss. Please advise.

## 2024-08-15 NOTE — Telephone Encounter (Signed)
 Please call in Augmentin  [phone] 1 tab po BID x 10 days okay to call in Zofran  8mg  ODT Q8hrs prn #20, NR If not better will need labs/CT/OV RG

## 2024-08-15 NOTE — Telephone Encounter (Signed)
 Called and informed patient that prescriptions have been sent to [facility] in [place]  as requested. Patient has been advised to call prior to her appt if she is not improving. Patient verbalized understanding and had no concerns at the end of the call.

## 2024-08-22 ENCOUNTER — Ambulatory Visit: Admitting: Gastroenterology

## 2024-08-23 ENCOUNTER — Encounter: Payer: Self-pay | Admitting: Gastroenterology

## 2024-08-23 ENCOUNTER — Other Ambulatory Visit (INDEPENDENT_AMBULATORY_CARE_PROVIDER_SITE_OTHER)

## 2024-08-23 ENCOUNTER — Ambulatory Visit: Admitting: Gastroenterology

## 2024-08-23 VITALS — BP 124/80 | HR 80 | Ht 59.0 in | Wt 218.0 lb

## 2024-08-23 DIAGNOSIS — K5732 Diverticulitis of large intestine without perforation or abscess without bleeding: Secondary | ICD-10-CM | POA: Diagnosis not present

## 2024-08-23 DIAGNOSIS — R1032 Left lower quadrant pain: Secondary | ICD-10-CM

## 2024-08-23 DIAGNOSIS — B370 Candidal stomatitis: Secondary | ICD-10-CM

## 2024-08-23 LAB — COMPREHENSIVE METABOLIC PANEL WITH GFR
AG Ratio: 1.4 (calc) (ref 1.0–2.5)
ALT: 29 U/L (ref 6–29)
AST: 53 U/L — ABNORMAL HIGH (ref 10–35)
Albumin: 3.6 g/dL (ref 3.6–5.1)
Alkaline phosphatase (APISO): 77 U/L (ref 37–153)
BUN: 11 mg/dL (ref 7–25)
CO2: 33 mmol/L — ABNORMAL HIGH (ref 20–32)
Calcium: 8.8 mg/dL (ref 8.6–10.4)
Chloride: 100 mmol/L (ref 98–110)
Creat: 0.92 mg/dL (ref 0.50–1.03)
Globulin: 2.6 g/dL (ref 1.9–3.7)
Glucose, Bld: 94 mg/dL (ref 65–99)
Potassium: 3.8 mmol/L (ref 3.5–5.3)
Sodium: 140 mmol/L (ref 135–146)
Total Bilirubin: 0.3 mg/dL (ref 0.2–1.2)
Total Protein: 6.2 g/dL (ref 6.1–8.1)
eGFR: 75 mL/min/1.73m2 (ref >=60)

## 2024-08-23 LAB — CBC WITH DIFFERENTIAL/PLATELET
Absolute Lymphocytes: 2108 {cells}/uL (ref 850–3900)
Absolute Monocytes: 521 {cells}/uL (ref 200–950)
Basophils Absolute: 50 {cells}/uL (ref 0–200)
Basophils Relative: 0.6 %
Eosinophils Absolute: 118 {cells}/uL (ref 15–500)
Eosinophils Relative: 1.4 %
HCT: 44.2 % (ref 35.0–45.0)
Hemoglobin: 15 g/dL (ref 11.7–15.5)
MCH: 30.5 pg (ref 27.0–33.0)
MCHC: 33.9 g/dL (ref 32.0–36.0)
MCV: 90 fL (ref 80.0–100.0)
MPV: 10.9 fL (ref 7.5–12.5)
Monocytes Relative: 6.2 %
Neutro Abs: 5603 {cells}/uL (ref 1500–7800)
Neutrophils Relative %: 66.7 %
Platelets: 405 Thousand/uL — ABNORMAL HIGH (ref 140–400)
RBC: 4.91 Million/uL (ref 3.80–5.10)
RDW: 12.1 % (ref 11.0–15.0)
Total Lymphocyte: 25.1 %
WBC: 8.4 Thousand/uL (ref 3.8–10.8)

## 2024-08-23 LAB — LIPASE: Lipase: 13 U/L (ref 7–60)

## 2024-08-23 MED ORDER — AMOXICILLIN-POT CLAVULANATE 875-125 MG PO TABS: 1.0000 | ORAL_TABLET | Freq: Two times a day (BID) | ORAL | 0 refills | Status: AC

## 2024-08-23 MED ORDER — NYSTATIN 100000 UNIT/ML MT SUSP: 5.0000 mL | Freq: Four times a day (QID) | OROMUCOSAL | 0 refills | Status: AC

## 2024-08-23 NOTE — Patient Instructions (Addendum)
 Full liquid diet  -Advised to go to the ER if there is any severe abdominal pain, unable to hold down food/water, blood in stool or vomit, chest pain, shortness of breath, or any worsening symptoms.    We have sent the following medications to your pharmacy for you to pick up at your convenience: Augmentin  & Nystatin    You have been scheduled for a CT scan of the abdomen and pelvis at [facility]. You are scheduled on Friday 08/24/24 at 1 pm. You should arrive 15 minutes prior to your appointment time for registration.    Please follow the written instructions below on the day of your exam:   1) Do not eat anything after 12 pm (4 hours prior to your test)   You may take any medications as prescribed with a small amount of water, if necessary. If you take any of the following medications: METFORMIN, GLUCOPHAGE, GLUCOVANCE, AVANDAMET, RIOMET, FORTAMET, ACTOPLUS MET, JANUMET, GLUMETZA or METAGLIP, you MAY be asked to HOLD this medication 48 hours AFTER the exam.   The purpose of you drinking the oral contrast is to aid in the visualization of your intestinal tract. The contrast solution may cause some diarrhea. Depending on your individual set of symptoms, you may also receive an intravenous injection of x-ray contrast/dye. Plan on being at [facility] for 45 minutes or longer, depending on the type of exam you are having performed.   If you have any questions regarding your exam or if you need to reschedule, you may call [facility] at [phone] between the hours of 8:00 am and 5:00 pm, Monday-Friday.

## 2024-08-23 NOTE — Progress Notes (Signed)
 Chief Complaint: follow-up diverticulitis Primary GI Doctor:Dr. Charlanne  HPI: Christina Potts is a 53 y.o. 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  Diagnosed with IBS.  FU from Adm 1/26- 12/28/2022 with Acute Uncomplicated descending colon diverticulitis: Presented with LLQ and LUQ pain consistent with prior episode of diverticulitis in 2021. KUB with nonobstructive bowel gas pattern. CT Abd/Pelvis confirmed acute diverticulitis of descending colon. Pt treated with Zosyn with transition to Augmentin  on 1/28 to complete 10 day course.   Last seen in GI office on 01/25/23 by Dr. Charlanne for follow-up.   Interval History     Patient presents for evaluation of recurrent diverticulitis.  Patient called office on 9/17 with complaint of abdominal pain that started 9/11-9/12. She reports she thought the abdominal pain could be due to constipation so she started Miralax over the weekend. Her abdominal pain  worsened, patient described as a sharp, pulsating pain that radiated to her back. Pain is predominantly left upper quadrant.   Dr. Charlanne sent in Augmentin  for 10 days, she has completed 8 days. She reports the abdominal pain comes and goes. She reports her bowels are currently loose and runny.  She has not been eating much mostly soft foods like eggs or jello. Poor appetite.  She complains of nausea and using ondansetron .  She reports she has been running fever  as high as 101.6 last week.   She also complains of oral thrush since starting the antibiotics. Reports white film on her mouth. She has been doing frequent oral care.    On review of previous records:   - ED visit to [facility] Feb 2020 with LLQ pain. CT AP- descending colon diverticulitis without abscess. Rxed with Augmentin  875 BID   -Adm 10/28/2020- 11/02/2020 [facility]). Reviewed: Patient with a 6 day history of abdominal  pain, nausea, hematochezia, trouble with PO intake, and fever (Tmax 102.5). Long history diverticular disease with multiple flares. On presentation, leukocytosis noted at 14.3. Colonic abscess on CT measuring 2.4cm. No perf or obstruction. Consulted surgery who recommended serial abdominal exams and consult IR given that she does not currently have an acute abdomen. IR consulted, abscess currently too small for percutaneous drainage. Given the size of the abscess as <4cm, continued medical management with Zosyn, IVFs, bowel rest and symptom control. Patient's symptoms continued to improve and her diet was advanced. On day 4 of hospitalization, repeat CT abdomen showed improvement of inflammatory changes and no readily apparent abscess.Pt transitioned to oral Levaquin  and metronidazole  which she will continue on discharge.     Past GI work-up:   CT AP with contrast 12/25/2022 FINDINGS: LOWER CHEST: Dependent subsegmental atelectasis in the visualized lung bases. No pleural or pericardial effusion. LIVER: Normal liver contour. No focal liver lesions. BILIARY: The gallbladder is normal in appearance. No biliary ductal dilatation. SPLEEN: Normal in size and contour. Subcentimeter hypoattenuating lesion in the anterior spleen too small to characterize, unchanged. PANCREAS: Normal pancreatic contour. No focal lesions. No ductal dilation. ADRENAL GLANDS: Normal appearance of the adrenal glands. KIDNEYS/URETERS: Symmetric renal enhancement. No hydronephrosis. No solid renal mass. BLADDER: Unremarkable. REPRODUCTIVE ORGANS: Uterus is surgically absent. No suspicious adnexal lesions. GI TRACT: The stomach appears unremarkable. The small bowel is normal in caliber. There is a long segment of circumferentially thickened descending colon with associated pericolonic inflammatory stranding and small volume free fluid tracking along the left paracolic gutter (5:28, 2:44).  No diverticular abscess. PERITONEUM, RETROPERITONEUM  AND MESENTERY: No free air. Small volume free fluid tracking along left paracolic gutter. LYMPH NODES: No adenopathy. VESSELS: Hepatic and portal veins are patent. Normal caliber aorta. BONES and SOFT TISSUES: Mild degenerative changes of the visualized spine. Posterior spinal fusion hardware with interbody spacer at L4-L5. Small fat-containing umbilical hernia.    EGD 09/2018 - Mild Gastritis. Bx neg for HP, neg SB bx for celiac   Colonoscopy 09/2018 -Pancolonic diverticulosis predominantly in the sigmoid colon. -Small internal hemorrhoids (likely etiology of rectal bleeding, no bleeding currently) -Otherwise normal colonoscopy to TI. -Rpt 10 yrs. Earlier if any new problems or change in family history.   CT AP with contrast 04/02/2021 IMPRESSION: 1. No acute abdominopelvic findings. 2. Colonic diverticulosis without findings of acute diverticulitis.  06/2022 US  abd complete IMPRESSION: Diffuse increased echogenicity of the hepatic parenchyma is a nonspecific indicator of hepatocellular dysfunction, most commonly steatosis.    Wt Readings from Last 3 Encounters:  08/23/24 218 lb (98.9 kg)  03/15/23 225 lb (102.1 kg)  01/25/23 225 lb 4 oz (102.2 kg)      Past Medical History:  Diagnosis Date   ADD (attention deficit disorder)    Anemia    as child   Anxiety    Arthritis    Asthma    Bowel obstruction (HCC)    BRCA negative 01/2013, 9/19   BRCA/Colaris neg; MyRisk neg 9/19   Chronic headaches    Colon polyps    2010-2011   DDD (degenerative disc disease), cervical    Depression    Diverticulitis    Diverticulosis    Family history of breast cancer 07/2018   IBIS=12.4%   GERD (gastroesophageal reflux disease)    History of snoring    Hypertension    IBS (irritable bowel syndrome)    Migraines    had one 2 weeks ago   Pneumonia    2014   PONV (postoperative nausea and vomiting)    Spondylolisthesis of lumbar region     Past Surgical History:  Procedure  Laterality Date   ABDOMINAL HYSTERECTOMY  11/08/2000   TAH RSO, LOA due to pelvic pain/menometrorrhagia. Dr. Van Dalen. Partical   ANTERIOR CERVICAL DECOMP/DISCECTOMY FUSION N/A 05/25/2018   Procedure: ANTERIOR CERVICAL DECOMPRESSION FUSION CERVICAL FOUR-FIVE,CERVICAL FIVE-SIX,CERVICAL SIX-SEVEN.;  Surgeon: Joshua Alm RAMAN, MD;  Location: [facility] [place] OR;  Service: Neurosurgery;  Laterality: N/A;  right side approach   BACK SURGERY  08/2017   Lumbar surgery with Dr. Alm Joshua   CARPAL TUNNEL RELEASE Left    CESAREAN SECTION     X 2   COLONOSCOPY     2009   ESOPHAGOGASTRODUODENOSCOPY  02/13/2009   Minimal gastritis. Otherwise normal EGD.    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 18 g 0   amphetamine-dextroamphetamine (ADDERALL) 15 MG tablet Take 1 tablet by mouth 2 (two) times daily.     buPROPion  (WELLBUTRIN  XL) 300 MG 24 hr tablet Take 300 mg by mouth daily.     levalbuterol (XOPENEX HFA) 45 MCG/ACT inhaler SMARTSIG:2 Puff(s) Via Inhaler 4 Times Daily PRN     losartan -hydrochlorothiazide  (HYZAAR) 50-12.5 MG per tablet Take  1 tablet by mouth at bedtime.      montelukast (SINGULAIR) 10 MG tablet Take 10 mg by mouth daily.     nystatin  (MYCOSTATIN ) 100000 UNIT/ML suspension Take 5 mLs (500,000 Units total) by mouth 4 (four) times daily. 270 mL 0   ondansetron  (ZOFRAN -ODT) 4 MG disintegrating tablet Take 1 tablet (4 mg total) by mouth every 8 (eight) hours as needed for nausea or vomiting. 30 tablet 0   ondansetron  (ZOFRAN -ODT) 8 MG disintegrating tablet Take 1 tablet (8 mg total) by mouth every 8 (eight) hours as needed for nausea or vomiting. 20 tablet 0   polyethylene glycol (MIRALAX) 17 g packet Take 17 g by mouth  as needed for moderate constipation or mild constipation.     QNASL 80 MCG/ACT AERS Place 2 sprays into both nostrils daily.     amoxicillin -clavulanate (AUGMENTIN ) 875-125 MG tablet Take 1 tablet by mouth 2 (two) times daily for 10 days. 20 tablet 0   No current facility-administered medications for this visit.    Allergies as of 08/23/2024 - Review Complete 08/23/2024  Allergen Reaction Noted   Dilaudid [hydromorphone hcl] Anaphylaxis and Shortness Of Breath 11/27/2015   Strawberry (diagnostic) Anaphylaxis, Hives, and Swelling 11/27/2015   Morphine  and codeine Rash and Other (See Comments) 11/27/2015    Family History  Problem Relation Age of Onset   Irritable bowel syndrome Mother    Liver cancer Maternal Grandmother        liver disease   Diabetes Maternal Grandmother    Kidney disease Maternal Grandfather    Diabetes Maternal Grandfather    Liver disease Maternal Grandfather    Lung cancer Maternal Aunt        60s   Diabetes Maternal Aunt    Lung cancer Maternal Aunt    Breast cancer Paternal Aunt    Prostate cancer Paternal Uncle    Ovarian cancer Cousin    Breast cancer Cousin    Brain cancer Cousin    Stomach cancer Cousin    Aneurysm Cousin    Colon cancer Cousin    Ovarian cancer Other    Breast cancer Other        5 cousins, 76-50 yrs old   Esophageal cancer Neg Hx    Pancreatic cancer Neg Hx     Review of Systems:    Constitutional: No weight loss, fever, chills, weakness or fatigue HEENT: Eyes: No change in vision               Ears, Nose, Throat:  No change in hearing or congestion Skin: No rash or itching Cardiovascular: No chest pain, chest pressure or palpitations   Respiratory: No SOB or cough Gastrointestinal: See HPI and otherwise negative Genitourinary: No dysuria or change in urinary frequency Neurological: No headache, dizziness or syncope Musculoskeletal: No new muscle or joint pain Hematologic: No bleeding or bruising Psychiatric: No  history of depression or anxiety    Physical Exam:  Vital signs: BP 124/80   Pulse 80   Ht 4' 11 (1.499 m)   Wt 218 lb (98.9 kg)   BMI 44.03 kg/m   Constitutional:   Pleasant  female appears to be iin discomfort, Well developed, alert and cooperative Throat: Oral cavity and pharynx without inflammation, swelling or lesion.  Respiratory: Respirations even and unlabored. Lungs clear to auscultation bilaterally.   No wheezes, crackles, or rhonchi.  Cardiovascular: Normal S1, S2. Regular rate and rhythm. No peripheral edema, cyanosis or pallor.  Gastrointestinal:  Soft, nondistended, generalized  abdominal tenderness to left side. No rebound or guarding. Normal bowel sounds. No appreciable masses or hepatomegaly. Rectal:  Not performed.  Msk:  Symmetrical without gross deformities. Without edema, no deformity or joint abnormality.  Neurologic:  Alert and  oriented x4;  grossly normal neurologically.  Skin:   Dry and intact without significant lesions or rashes.  RELEVANT LABS AND IMAGING: CBC    Latest Ref Rng & Units 01/25/2023    3:39 PM 04/29/2022   11:39 AM 03/11/2021   10:28 AM  CBC  WBC 3.8 - 10.8 Thousand/uL 9.0  8.8  9.0   Hemoglobin 11.7 - 15.5 g/dL 84.4  84.8  84.0   Hematocrit 35.0 - 45.0 % 44.1  44.5  47.3   Platelets 140 - 400 Thousand/uL 355  297  334      CMP     Latest Ref Rng & Units 01/25/2023    3:39 PM 04/29/2022   11:39 AM 03/11/2021   10:28 AM  CMP  Glucose 65 - 99 mg/dL 95  88  89   BUN 7 - 25 mg/dL 13  11  17    Creatinine 0.50 - 0.99 mg/dL 9.15  9.09  9.03   Sodium 135 - 146 mmol/L 138  137  138   Potassium 3.5 - 5.3 mmol/L 3.8  4.2  4.3   Chloride 98 - 110 mmol/L 101  102  99   CO2 20 - 32 mmol/L 24  26  27    Calcium 8.6 - 10.2 mg/dL 9.1  8.9  9.3   Total Protein 6.1 - 8.1 g/dL 6.9  6.4  6.8   Total Bilirubin 0.2 - 1.2 mg/dL 0.3  0.3  0.4   AST 10 - 35 U/L 22  18  18    ALT 6 - 29 U/L 24  18  18       Lab Results  Component Value Date   TSH 1.380  07/26/2018     Assessment: Encounter Diagnoses  Name Primary?   Diverticulitis of colon    Thrush Yes   LLQ pain     #1.  LUQ/LLQ Pain   #2. H/O Recurrent descending colon diverticulitis  -Adm 1/26- 12/28/2022 for desc colon diverticulitis without abscess, treated with Augmentin  -Adm 11/30-12/03/2020 to [facility] with 2.4 cm div abscess, Rx with Levaquin /Flagyl . Resolved. Neg CT 03/2021 -01/19/2019 on CT at [facility]. Rx with augmentin .  -Neg colon 09/2018 except for pancolonic diverticulosis. --Given RX for Augmentin  08/14/24   #2. IBS-C (with alt diarrhea) #3. GERD #4 Oral thrush  Plan: - order CBC, CMP, lipase  - stat CTAP  -Recommend full liquid diet  -nystatin  swish QID  Thank you for the courtesy of this consult. Please call me with any questions or concerns.   Omari Koslosky, FNP-C [facility] 08/23/2024, 5:28 PM  Cc: Derick Leita POUR, MD

## 2024-08-24 ENCOUNTER — Telehealth: Payer: Self-pay | Admitting: *Deleted

## 2024-08-24 ENCOUNTER — Ambulatory Visit
Admission: RE | Admit: 2024-08-24 | Discharge: 2024-08-24 | Disposition: A | Discharge status: Home / Self Care | Source: Ambulatory Visit | Attending: Gastroenterology | Admitting: Gastroenterology

## 2024-08-24 ENCOUNTER — Other Ambulatory Visit (HOSPITAL_COMMUNITY)

## 2024-08-24 ENCOUNTER — Other Ambulatory Visit: Payer: Self-pay | Admitting: Gastroenterology

## 2024-08-24 DIAGNOSIS — K5732 Diverticulitis of large intestine without perforation or abscess without bleeding: Secondary | ICD-10-CM | POA: Insufficient documentation

## 2024-08-24 MED ORDER — CIPROFLOXACIN HCL 500 MG PO TABS: 500.0000 mg | ORAL_TABLET | Freq: Two times a day (BID) | ORAL | 0 refills | Status: AC

## 2024-08-24 MED ORDER — METRONIDAZOLE 500 MG PO TABS: 500.0000 mg | ORAL_TABLET | Freq: Three times a day (TID) | ORAL | 0 refills | Status: AC

## 2024-08-24 MED ORDER — IOHEXOL 300 MG/ML  SOLN
100.0000 mL | Freq: Once | INTRAMUSCULAR | Status: AC | PRN
  Administered 2024-08-24: 100 mL via INTRAVENOUS

## 2024-08-24 NOTE — Telephone Encounter (Signed)
 Doc of day PM  Dr. Shila - Patient had a STAT CT scan that shows diverticulitis. Please review.

## 2024-08-24 NOTE — Telephone Encounter (Signed)
 Called patient, acute diverticulitis, no abscess or perforation.  No improvement with 10 days of Augmentin .  She still having severe pain.  Advised her to switch to ciprofloxacin  500 mg twice daily and Flagyl  100 mg 3 times daily to complete 14-day course.  Please follow-up on Monday to check if her symptoms are improving.  Advised her to come to ER if has worsening pain, may need IV antibiotics and hospitalization

## 2024-08-27 NOTE — Telephone Encounter (Signed)
 Patient states she has not been able to start the Cipro  yet as her pharmacy did not have it. She is suppose to pick it up today. Patient states if she has not see improvement by mid-week she will go to the ER.

## 2024-09-02 ENCOUNTER — Other Ambulatory Visit: Payer: Self-pay | Admitting: Gastroenterology

## 2024-09-06 ENCOUNTER — Ambulatory Visit: Payer: Self-pay | Admitting: Gastroenterology

## 2024-09-21 ENCOUNTER — Ambulatory Visit (INDEPENDENT_AMBULATORY_CARE_PROVIDER_SITE_OTHER): Admitting: Gastroenterology

## 2024-09-21 ENCOUNTER — Encounter: Payer: Self-pay | Admitting: Gastroenterology

## 2024-09-21 VITALS — BP 132/90 | HR 79 | Ht 59.0 in | Wt 215.0 lb

## 2024-09-21 DIAGNOSIS — K582 Mixed irritable bowel syndrome: Secondary | ICD-10-CM | POA: Diagnosis not present

## 2024-09-21 DIAGNOSIS — K5732 Diverticulitis of large intestine without perforation or abscess without bleeding: Secondary | ICD-10-CM

## 2024-09-21 MED ORDER — DICYCLOMINE HCL 10 MG PO CAPS: 10.0000 mg | ORAL_CAPSULE | Freq: Three times a day (TID) | ORAL | 0 refills | Status: AC

## 2024-09-21 NOTE — Progress Notes (Signed)
 Chief Complaint: FU  Referring Provider:  Derick Leita POUR, MD      ASSESSMENT AND PLAN;   #1.  LUQ Pain  #2. H/O Recurrent sig/descending colon diverticulitis  -CT AP 08/24/2024. Treated with augmentin  x 10 days, then cipro /flagyl  x 14 days -Adm 1/26- 12/28/2022 for desc colon diverticulitis without abscess, treated with Augmentin  -Adm 11/30-12/03/2020 to [facility] with 2.4 cm div abscess, Rx with Levaquin /Flagyl . Resolved. Neg CT 03/2021 -01/19/2019 on CT at [facility]. Rx with augmentin .  -Neg colon 02/2023, 09/2018 except for pancolonic diverticulosis.  #3. IBS-C (with alt diarrhea)   Plan: -CBC, CMP, lipase  -Rpt CT AP with contrast (to r/o abscess) -Continue Miralax 17g po every other day prn -Bentyl  10mg  po BID #60 -Surgical eval ([facility]SA)for possible L colectomy for recurrent diverticulitis.  HPI:    Christina Potts is a 53 y.o. female  Works in the lab Systems developer) at [employer] With over 15-year H/O abdo pain with alternating constipation and rare diarrhea.  Diagnosed with IBS. History of Present Illness With recurrent diverticulitis who presents with ongoing abdominal pain and bowel changes.  She has been experiencing symptoms of diverticulitis since the second week of September 2025. Initially, she was treated with a course of Augmentin  and Zofran  for ten days, Then underwent CT AP (as below) showing acute sigmoid/descending diverticulitis without abscess, followed by Cipro  and Flagyl  for fourteen days. Despite these treatments, her symptoms persist.  Her symptoms include abdominal pain that originates on the left side and radiates to the right side and under the rib cage. She also experiences nausea, vomiting, and alternating bowel habits between constipation and diarrhea. The pain occasionally improves with bowel movements but remains persistent and recurrent.  She has made dietary modifications, consuming foods like applesauce, Jell-O, light scrambled eggs,  and yogurt, which she tolerates well. However, reintroducing other foods leads to a return of symptoms.  She uses Miralax regularly, every day or every other day, to manage constipation. She also experiences joint pain in her hip, knee, and ankle, which she associates with her abdominal symptoms. She has not been using Bentyl  or dicyclomine  recently for spasms.  No further fever or chills.  Denies having any upper GI symptoms including nausea, vomiting, heartburn, regurgitation.  No odynophagia or dysphagia.  No sodas, chocolates, chewing gums, artificial sweeteners and candy. No NSAIDs.    On review of previous records:  -Adm 1/26- 12/28/2022 with Acute Uncomplicated descending colon diverticulitis: Presented with LLQ and LUQ pain consistent with prior episode of diverticulitis in 2021. KUB with nonobstructive bowel gas pattern. CT Abd/Pelvis confirmed acute diverticulitis of descending colon. Pt treated with Zosyn with transition to Augmentin  on 1/28 to complete 10 day course.   - ED visit to [facility] Feb 2020 with LLQ pain. CT AP- descending colon diverticulitis without abscess. Rxed with Augmentin  875 BID  -Adm 10/28/2020- 11/02/2020 [facility]). Reviewed: Patient with a 6 day history of abdominal pain, nausea, hematochezia, trouble with PO intake, and fever (Tmax 102.5). Long history diverticular disease with multiple flares. On presentation, leukocytosis noted at 14.3. Colonic abscess on CT measuring 2.4cm. No perf or obstruction. Consulted surgery who recommended serial abdominal exams and consult IR given that she does not currently have an acute abdomen. IR consulted, abscess currently too small for percutaneous drainage. Given the size of the abscess as <4cm, continued medical management with Zosyn, IVFs, bowel rest and symptom control. Patient's symptoms continued to improve and her diet was advanced. On day 4  of hospitalization, repeat CT abdomen showed improvement of  inflammatory changes and no readily apparent abscess.Pt transitioned to oral Levaquin  and metronidazole  which she will continue on discharge.   Past GI work-up: Colon 02/2023 - Moderate pancolonic diverticulosis predominantly in the sigmoid colon. - The entire examined colon is normal. Biopsied. - Non- bleeding internal hemorrhoids. - The examined portion of the ileum was normal. - The examination was otherwise normal on direct and retroflexion views.  CT AP with contrast 08/24/2024 1. Colonic diverticulosis. Inflammatory stranding and wall thickening around the distal descending colon likely representing acute diverticulitis. No abscess or perforation. Follow-up after resolution of acute process recommended to exclude underlying focal lesion. 2. No evidence of bowel obstruction.   CT AP with contrast 12/25/2022 FINDINGS: LOWER CHEST: Dependent subsegmental atelectasis in the visualized lung bases. No pleural or pericardial effusion. LIVER: Normal liver contour. No focal liver lesions. BILIARY: The gallbladder is normal in appearance. No biliary ductal dilatation. SPLEEN: Normal in size and contour. Subcentimeter hypoattenuating lesion in the anterior spleen too small to characterize, unchanged. PANCREAS: Normal pancreatic contour. No focal lesions. No ductal dilation. ADRENAL GLANDS: Normal appearance of the adrenal glands. KIDNEYS/URETERS: Symmetric renal enhancement. No hydronephrosis. No solid renal mass. BLADDER: Unremarkable. REPRODUCTIVE ORGANS: Uterus is surgically absent. No suspicious adnexal lesions. GI TRACT: The stomach appears unremarkable. The small bowel is normal in caliber. There is a long segment of circumferentially thickened descending colon with associated pericolonic inflammatory stranding and small volume free fluid tracking along the left paracolic gutter (5:28, 2:44). No diverticular abscess. PERITONEUM, RETROPERITONEUM AND MESENTERY: No free air. Small volume free fluid  tracking along left paracolic gutter. LYMPH NODES: No adenopathy. VESSELS: Hepatic and portal veins are patent. Normal caliber aorta. BONES and SOFT TISSUES: Mild degenerative changes of the visualized spine. Posterior spinal fusion hardware with interbody spacer at L4-L5. Small fat-containing umbilical hernia.   EGD 09/2018 - Mild Gastritis. Bx neg for HP, neg SB bx for celiac  Colonoscopy 09/2018 -Pancolonic diverticulosis predominantly in the sigmoid colon. -Small internal hemorrhoids (likely etiology of rectal bleeding, no bleeding currently) -Otherwise normal colonoscopy to TI. -Rpt 10 yrs. Earlier if any new problems or change in family history.  CT AP with contrast 04/02/2021 IMPRESSION: 1. No acute abdominopelvic findings. 2. Colonic diverticulosis without findings of acute diverticulitis. Past Medical History:  Diagnosis Date   ADD (attention deficit disorder)    Anemia    as child   Anxiety    Arthritis    Asthma    Bowel obstruction (HCC)    BRCA negative 01/2013, 9/19   BRCA/Colaris neg; MyRisk neg 9/19   Chronic headaches    Colon polyps    2010-2011   DDD (degenerative disc disease), cervical    Depression    Diverticulitis    Diverticulosis    Family history of breast cancer 07/2018   IBIS=12.4%   GERD (gastroesophageal reflux disease)    History of snoring    Hypertension    IBS (irritable bowel syndrome)    Migraines    had one 2 weeks ago   Pneumonia    2014   PONV (postoperative nausea and vomiting)    Spondylolisthesis of lumbar region     Past Surgical History:  Procedure Laterality Date   ABDOMINAL HYSTERECTOMY  11/08/2000   TAH RSO, LOA due to pelvic pain/menometrorrhagia. Dr. Van Dalen. Partical   ANTERIOR CERVICAL DECOMP/DISCECTOMY FUSION N/A 05/25/2018   Procedure: ANTERIOR CERVICAL DECOMPRESSION FUSION CERVICAL FOUR-FIVE,CERVICAL FIVE-SIX,CERVICAL SIX-SEVEN.;  Surgeon: Joshua,  Alm RAMAN, MD;  Location: [facility] OR;  Service: Neurosurgery;   Laterality: N/A;  right side approach   BACK SURGERY  08/2017   Lumbar surgery with Dr. Alm Molt   CARPAL TUNNEL RELEASE Left    CESAREAN SECTION     X 2   COLONOSCOPY     2009   ESOPHAGOGASTRODUODENOSCOPY  02/13/2009   Minimal gastritis. Otherwise normal EGD.    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     WRIST SURGERY Left    Cartlige bremoved    Family History  Problem Relation Age of Onset   Irritable bowel syndrome Mother    Liver cancer Maternal Grandmother        liver disease   Diabetes Maternal Grandmother    Kidney disease Maternal Grandfather    Diabetes Maternal Grandfather    Liver disease Maternal Grandfather    Lung cancer Maternal Aunt        60s   Diabetes Maternal Aunt    Lung cancer Maternal Aunt    Breast cancer Paternal Aunt    Prostate cancer Paternal Uncle    Ovarian cancer Cousin    Breast cancer Cousin    Brain cancer Cousin    Stomach cancer Cousin    Aneurysm Cousin    Colon cancer Cousin    Ovarian cancer Other    Breast cancer Other        5 cousins, 30-50 yrs old   Esophageal cancer Neg Hx    Pancreatic cancer Neg Hx     Social History   Tobacco Use   Smoking status: Never   Smokeless tobacco: Never  Vaping Use   Vaping status: Never Used  Substance Use Topics   Alcohol use: Not Currently    Comment: rare   Drug use: No    Current Outpatient Medications  Medication Sig Dispense Refill   albuterol  (VENTOLIN  HFA) 108 (90 Base) MCG/ACT inhaler Inhale 2 puffs into the lungs every 4 (four) hours as needed for wheezing or shortness of breath. 18 g 0   amphetamine-dextroamphetamine (ADDERALL) 15 MG tablet Take 1 tablet by mouth 2 (two) times daily.     buPROPion  (WELLBUTRIN  XL) 300 MG 24 hr tablet Take 300 mg by mouth daily.     levalbuterol (XOPENEX HFA) 45 MCG/ACT inhaler SMARTSIG:2 Puff(s) Via Inhaler 4 Times Daily PRN     losartan -hydrochlorothiazide  (HYZAAR) 50-12.5 MG per tablet Take 1 tablet by mouth  at bedtime.      montelukast (SINGULAIR) 10 MG tablet Take 10 mg by mouth daily.     nystatin  (MYCOSTATIN ) 100000 UNIT/ML suspension Take 5 mLs (500,000 Units total) by mouth 4 (four) times daily. 270 mL 0   ondansetron  (ZOFRAN -ODT) 8 MG disintegrating tablet Take 1 tablet (8 mg total) by mouth every 8 (eight) hours as needed for nausea or vomiting. 20 tablet 0   polyethylene glycol (MIRALAX) 17 g packet Take 17 g by mouth as needed for moderate constipation or mild constipation.     QNASL 80 MCG/ACT AERS Place 2 sprays into both nostrils daily.     No current facility-administered medications for this visit.    Allergies  Allergen Reactions   Dilaudid [Hydromorphone Hcl] Anaphylaxis and Shortness Of Breath    Patient reports 09/21/17 she tolerates oxycodone  fine   Strawberry (Diagnostic) Anaphylaxis, Hives and Swelling   Morphine  And Codeine Rash and Other (See Comments)    Burns my skin  Review of Systems:  neg     Physical Exam:    BP (!) 132/90   Pulse 79   Ht 4' 11 (1.499 m)   Wt 215 lb (97.5 kg)   BMI 43.42 kg/m  Filed Weights   09/21/24 1315  Weight: 215 lb (97.5 kg)   Constitutional:  Well-developed, in no acute distress. Psychiatric: Normal mood and affect. Behavior is normal. HEENT: Pupils normal.  Conjunctivae are normal. No scleral icterus. Neck supple.  Well-healed surgical scar. Cardiovascular: Normal rate, regular rhythm. No edema Pulmonary/chest: Effort normal and breath sounds normal. No wheezing, rales or rhonchi. Abdominal: Soft, nondistended. LLQ mild tenderness without rebound. Bowel sounds active throughout. There are no masses palpable. No hepatomegaly. Neurological: Alert and oriented to person place and time. Skin: Skin is warm and dry. No rashes noted.  Data Reviewed: I have personally reviewed following labs and imaging studies  CBC:    Latest Ref Rng & Units 08/23/2024    4:33 PM 01/25/2023    3:39 PM 04/29/2022   11:39 AM  CBC  WBC  3.8 - 10.8 Thousand/uL 8.4  9.0  8.8   Hemoglobin 11.7 - 15.5 g/dL 84.9  84.4  84.8   Hematocrit 35.0 - 45.0 % 44.2  44.1  44.5   Platelets 140 - 400 Thousand/uL 405  355  297     CMP:    Latest Ref Rng & Units 08/23/2024    4:33 PM 01/25/2023    3:39 PM 04/29/2022   11:39 AM  CMP  Glucose 65 - 99 mg/dL 94  95  88   BUN 7 - 25 mg/dL 11  13  11    Creatinine 0.50 - 1.03 mg/dL 9.07  9.15  9.09   Sodium 135 - 146 mmol/L 140  138  137   Potassium 3.5 - 5.3 mmol/L 3.8  3.8  4.2   Chloride 98 - 110 mmol/L 100  101  102   CO2 20 - 32 mmol/L 33  24  26   Calcium 8.6 - 10.4 mg/dL 8.8  9.1  8.9   Total Protein 6.1 - 8.1 g/dL 6.2  6.9  6.4   Total Bilirubin 0.2 - 1.2 mg/dL 0.3  0.3  0.3   AST 10 - 35 U/L 53  22  18   ALT 6 - 29 U/L 29  24  18          Anselm Bring, MD 09/21/2024, 1:36 PM  Cc: Derick Leita POUR, MD

## 2024-09-21 NOTE — Patient Instructions (Addendum)
 Your provider has requested that you go to the basement level for lab work before leaving today. Press B on the elevator. The lab is located at the first door on the left as you exit the elevator.  We have sent the following medications to your pharmacy for you to pick up at your convenience:  Bentyl   Continue Miralax  You have been scheduled for a CT scan of the abdomen and pelvis at [facility], 1st floor Radiology. You are scheduled on 09/28/2024 at 12:15pm.   You may take any medications as prescribed with a small amount of water, if necessary. If you take any of the following medications: METFORMIN, GLUCOPHAGE, GLUCOVANCE, AVANDAMET, RIOMET, FORTAMET, ACTOPLUS MET, JANUMET, GLUMETZA or METAGLIP, you MAY be asked to HOLD this medication 48 hours AFTER the exam.   The purpose of you drinking the oral contrast is to aid in the visualization of your intestinal tract. The contrast solution may cause some diarrhea. Depending on your individual set of symptoms, you may also receive an intravenous injection of x-ray contrast/dye. Plan on being at [facility] for 45 minutes or longer, depending on the type of exam you are having performed.   If you have any questions regarding your exam or if you need to reschedule, you may call [facility] at [phone] between the hours of 8:00 am and 5:00 pm, Monday-Friday.   I will send in a referral to [facility]   _______________________________________________________  If your blood pressure at your visit was 140/90 or greater, please contact your primary care physician to follow up on this.  _______________________________________________________  If you are age 57 or older, your body mass index should be between 23-30. Your Body mass index is 43.42 kg/m. If this is out of the aforementioned range listed, please consider follow up with your Primary Care Provider.  If you are age 6 or younger, your body mass index  should be between 19-25. Your Body mass index is 43.42 kg/m. If this is out of the aformentioned range listed, please consider follow up with your Primary Care Provider.   ________________________________________________________  The [facility] providers would like to encourage you to use MYCHART to communicate with providers for non-urgent requests or questions.  Due to long hold times on the telephone, sending your provider a message by [facility] may be a faster and more efficient way to get a response.  Please allow 48 business hours for a response.  Please remember that this is for non-urgent requests.  _______________________________________________________  [facility] is using a team-based approach to care.  Your team is made up of your doctor and two to three APPS. Our APPS (Nurse Practitioners and Physician Assistants) work with your physician to ensure care continuity for you. They are fully qualified to address your health concerns and develop a treatment plan. They communicate directly with your gastroenterologist to care for you. Seeing the Advanced Practice Practitioners on your physician's team can help you by facilitating care more promptly, often allowing for earlier appointments, access to diagnostic testing, procedures, and other specialty referrals.

## 2024-09-28 ENCOUNTER — Other Ambulatory Visit (HOSPITAL_COMMUNITY)

## 2024-09-28 ENCOUNTER — Ambulatory Visit
Admission: RE | Admit: 2024-09-28 | Discharge: 2024-09-28 | Disposition: A | Discharge status: Home / Self Care | Source: Ambulatory Visit | Attending: Gastroenterology | Admitting: Gastroenterology

## 2024-09-28 DIAGNOSIS — K582 Mixed irritable bowel syndrome: Secondary | ICD-10-CM | POA: Diagnosis present

## 2024-09-28 DIAGNOSIS — K5732 Diverticulitis of large intestine without perforation or abscess without bleeding: Secondary | ICD-10-CM | POA: Insufficient documentation

## 2024-09-28 MED ORDER — IOHEXOL 300 MG/ML  SOLN
100.0000 mL | Freq: Once | INTRAMUSCULAR | Status: AC | PRN
  Administered 2024-09-28: 100 mL via INTRAVENOUS

## 2024-09-30 ENCOUNTER — Ambulatory Visit: Payer: Self-pay | Admitting: Gastroenterology

## 2024-10-03 ENCOUNTER — Ambulatory Visit: Payer: Self-pay | Admitting: General Surgery

## 2024-10-03 DIAGNOSIS — K5732 Diverticulitis of large intestine without perforation or abscess without bleeding: Secondary | ICD-10-CM

## 2024-10-11 ENCOUNTER — Other Ambulatory Visit: Payer: Self-pay | Admitting: Urology

## 2024-10-12 NOTE — Progress Notes (Signed)
 Surgical Instructions   Your procedure is scheduled on 10/19/2024. Report to [facility] Main Entrance A at 9:30 A.M., then check in with the Admitting office. Any questions or running late day of surgery: call [phone]  Questions prior to your surgery date: call [phone], Monday-Friday, 8am-4pm. If you experience any cold or flu symptoms such as cough, fever, chills, shortness of breath, etc. between now and your scheduled surgery, please notify us  at the above number.     Remember:  Do not eat after midnight the night before your surgery   You may drink clear liquids until 8:30 A.M. the morning of your surgery.   Clear liquids allowed are: Water, Non-Citrus Juices (without pulp), Carbonated Beverages, Clear Tea (no milk, honey, etc.), Black Coffee Only (NO MILK, CREAM OR POWDERED CREAMER of any kind), and Gatorade.    Take these medicines the morning of surgery with A SIP OF WATER  buPROPion  (WELLBUTRIN  XL)  dicyclomine  (BENTYL )  montelukast (SINGULAIR)   May take these medicines IF NEEDED: albuterol  (VENTOLIN  HFA) and bring to hospital ondansetron  (ZOFRAN -ODT)  polyethylene glycol (MIRALAX)  QNASL   One week prior to surgery, STOP taking any Aspirin (unless otherwise instructed by your surgeon) Aleve, Naproxen, Ibuprofen , Motrin , Advil , Goody's, BC's, all herbal medications, fish oil, and non-prescription vitamins.                     Do NOT Smoke (Tobacco/Vaping) for 24 hours prior to your procedure.  If you use a CPAP at night, you may bring your mask/headgear for your overnight stay.   You will be asked to remove any contacts, glasses, piercing's, hearing aid's, dentures/partials prior to surgery. Please bring cases for these items if needed.    Patients discharged the day of surgery will not be allowed to drive home, and someone needs to stay with them for 24 hours.  SURGICAL WAITING ROOM VISITATION Patients may have no more than 2 support people in the  waiting area - these visitors may rotate.   Pre-op nurse will coordinate an appropriate time for 1 ADULT support person, who may not rotate, to accompany patient in pre-op.  Children under the age of 23 must have an adult with them who is not the patient and must remain in the main waiting area with an adult.  If the patient needs to stay at the hospital during part of their recovery, the visitor guidelines for inpatient rooms apply.  Please refer to the [facility] website for the visitor guidelines for any additional information.   If you received a COVID test during your pre-op visit  it is requested that you wear a mask when out in public, stay away from anyone that may not be feeling well and notify your surgeon if you develop symptoms. If you have been in contact with anyone that has tested positive in the last 10 days please notify you surgeon.      Pre-operative CHG Bathing Instructions   You can play a key role in reducing the risk of infection after surgery. Your skin needs to be as free of germs as possible. You can reduce the number of germs on your skin by washing with CHG (chlorhexidine  gluconate) soap before surgery. CHG is an antiseptic soap that kills germs and continues to kill germs even after washing.   DO NOT use if you have an allergy to chlorhexidine /CHG or antibacterial soaps. If your skin becomes reddened or irritated, stop using the CHG and notify one  of our RNs at [phone].              TAKE A SHOWER THE NIGHT BEFORE SURGERY   Please keep in mind the following:  DO NOT shave, including legs and underarms, 48 hours prior to surgery.   You may shave your face before/day of surgery.  Place clean sheets on your bed the night before surgery Use a clean washcloth (not used since being washed) for shower. DO NOT sleep with pet's night before surgery.  CHG Shower Instructions:  Wash your face and private area with normal soap. If you choose to wash your hair, wash  first with your normal shampoo.  After you use shampoo/soap, rinse your hair and body thoroughly to remove shampoo/soap residue.  Turn the water OFF and apply half the bottle of CHG soap to a CLEAN washcloth.  Apply CHG soap ONLY FROM YOUR NECK DOWN TO YOUR TOES (washing for 3-5 minutes)  DO NOT use CHG soap on face, private areas, open wounds, or sores.  Pay special attention to the area where your surgery is being performed.  If you are having back surgery, having someone wash your back for you may be helpful. Wait 2 minutes after CHG soap is applied, then you may rinse off the CHG soap.  Pat dry with a clean towel  Put on clean pajamas    Additional instructions for the day of surgery: If you choose, you may shower the morning of surgery with an antibacterial soap.  DO NOT APPLY any lotions, deodorants, cologne, or perfumes.   Do not wear jewelry or makeup Do not wear nail polish, gel polish, artificial nails, or any other type of covering on natural nails (fingers and toes) Do not bring valuables to the hospital. [facility] is not responsible for valuables/personal belongings. Put on clean/comfortable clothes.  Please brush your teeth.  Ask your nurse before applying any prescription medications to the skin.

## 2024-10-15 ENCOUNTER — Encounter (HOSPITAL_COMMUNITY)
Admission: RE | Admit: 2024-10-15 | Discharge: 2024-10-15 | Disposition: A | Discharge status: Home / Self Care | Source: Ambulatory Visit | Attending: General Surgery | Admitting: General Surgery

## 2024-10-15 ENCOUNTER — Encounter (HOSPITAL_COMMUNITY): Payer: Self-pay

## 2024-10-15 ENCOUNTER — Other Ambulatory Visit: Payer: Self-pay

## 2024-10-15 VITALS — BP 131/93 | HR 97 | Temp 98.1°F | Resp 18 | Ht 59.0 in | Wt 215.6 lb

## 2024-10-15 DIAGNOSIS — Z01818 Encounter for other preprocedural examination: Secondary | ICD-10-CM | POA: Insufficient documentation

## 2024-10-15 DIAGNOSIS — K5732 Diverticulitis of large intestine without perforation or abscess without bleeding: Secondary | ICD-10-CM | POA: Diagnosis not present

## 2024-10-15 DIAGNOSIS — I10 Essential (primary) hypertension: Secondary | ICD-10-CM | POA: Insufficient documentation

## 2024-10-15 HISTORY — DX: Sleep apnea, unspecified: G47.30

## 2024-10-15 HISTORY — DX: Umbilical hernia without obstruction or gangrene: K42.9

## 2024-10-15 LAB — COMPREHENSIVE METABOLIC PANEL WITH GFR
ALT: 25 U/L (ref 0–44)
AST: 27 U/L (ref 15–41)
Albumin: 3.3 g/dL — ABNORMAL LOW (ref 3.5–5.0)
Alkaline Phosphatase: 77 U/L (ref 38–126)
Anion gap: 11 (ref 5–15)
BUN: 15 mg/dL (ref 6–20)
CO2: 23 mmol/L (ref 22–32)
Calcium: 8.4 mg/dL — ABNORMAL LOW (ref 8.9–10.3)
Chloride: 105 mmol/L (ref 98–111)
Creatinine, Ser: 1 mg/dL (ref 0.44–1.00)
GFR, Estimated: >60 mL/min (ref >60)
Glucose, Bld: 86 mg/dL (ref 70–99)
Potassium: 4.1 mmol/L (ref 3.5–5.1)
Sodium: 139 mmol/L (ref 135–145)
Total Bilirubin: 0.9 mg/dL (ref 0.0–1.2)
Total Protein: 6.6 g/dL (ref 6.5–8.1)

## 2024-10-15 LAB — CBC WITH DIFFERENTIAL/PLATELET
Abs Immature Granulocytes: 0.02 K/uL (ref 0.00–0.07)
Basophils Absolute: 0 K/uL (ref 0.0–0.1)
Basophils Relative: 0 %
Eosinophils Absolute: 0.1 K/uL (ref 0.0–0.5)
Eosinophils Relative: 1 %
HCT: 45.2 % (ref 36.0–46.0)
Hemoglobin: 14.7 g/dL (ref 12.0–15.0)
Immature Granulocytes: 0 %
Lymphocytes Relative: 25 %
Lymphs Abs: 1.8 K/uL (ref 0.7–4.0)
MCH: 29.6 pg (ref 26.0–34.0)
MCHC: 32.5 g/dL (ref 30.0–36.0)
MCV: 91.1 fL (ref 80.0–100.0)
Monocytes Absolute: 0.4 K/uL (ref 0.1–1.0)
Monocytes Relative: 6 %
Neutro Abs: 4.8 K/uL (ref 1.7–7.7)
Neutrophils Relative %: 68 %
Platelets: 287 K/uL (ref 150–400)
RBC: 4.96 MIL/uL (ref 3.87–5.11)
RDW: 12.6 % (ref 11.5–15.5)
WBC: 7.2 K/uL (ref 4.0–10.5)
nRBC: 0 % (ref 0.0–0.2)

## 2024-10-15 LAB — PROTIME-INR
INR: 1 (ref 0.8–1.2)
Prothrombin Time: 14.2 s (ref 11.4–15.2)

## 2024-10-15 LAB — HEMOGLOBIN A1C
Hgb A1c MFr Bld: 5.3 % (ref 4.8–5.6)
Mean Plasma Glucose: 105.41 mg/dL

## 2024-10-15 NOTE — Progress Notes (Signed)
 PCP - Dr. Leita Alder Cardiologist - denies  PPM/ICD - denies   Chest x-ray - 01/09/24 EKG - 10/15/24 Stress Test - denies ECHO - denies Cardiac Cath - denies  Sleep Study - OSA+ CPAP - denies  DM- denies  Last dose of GLP1 agonist-  n/a   ASA/Blood Thinner Instructions: n/a   ERAS Protcol - 3 G2s given, clears until 0830   COVID TEST- n/a   Anesthesia review: no  Patient denies shortness of breath, fever, cough and chest pain at PAT appointment   All instructions explained to the patient, with a verbal understanding of the material. Patient agrees to go over the instructions while at home for a better understanding. The opportunity to ask questions was provided.

## 2024-10-15 NOTE — Progress Notes (Signed)
 Surgical Instructions   Your procedure is scheduled on 10/19/2024. Report to [facility] Main Entrance A at 9:30 A.M., then check in with the Admitting office. Any questions or running late day of surgery: call [phone]  Questions prior to your surgery date: call [phone], Monday-Friday, 8am-4pm. If you experience any cold or flu symptoms such as cough, fever, chills, shortness of breath, etc. between now and your scheduled surgery, please notify us  at the above number.     Remember:  Do not eat after midnight the night before your surgery   You may drink clear liquids until 8:30 A.M. the morning of your surgery.   Clear liquids allowed are: Water, Non-Citrus Juices (without pulp), Carbonated Beverages, Clear Tea (no milk, honey, etc.), Black Coffee Only (NO MILK, CREAM OR POWDERED CREAMER of any kind), and Gatorade.  Drink 2 G2s the night before surgery (11/20) and 1 G2 the morning of surgery (11/21) by 08:30 AM.   Take these medicines the morning of surgery with A SIP OF WATER  buPROPion  (WELLBUTRIN  XL)  dicyclomine  (BENTYL )  montelukast (SINGULAIR)   May take these medicines IF NEEDED: albuterol  (VENTOLIN  HFA)  bring inhaler with you ondansetron  (ZOFRAN -ODT)  QNASL nasal spray  One week prior to surgery, STOP taking any Aspirin (unless otherwise instructed by your surgeon) Aleve, Naproxen, Ibuprofen , Motrin , Advil , Goody's, BC's, all herbal medications, fish oil, and non-prescription vitamins.                     Do NOT Smoke (Tobacco/Vaping) for 24 hours prior to your procedure.  If you use a CPAP at night, you may bring your mask/headgear for your overnight stay.   You will be asked to remove any contacts, glasses, piercing's, hearing aid's, dentures/partials prior to surgery. Please bring cases for these items if needed.    Patients discharged the day of surgery will not be allowed to drive home, and someone needs to stay with them for 24 hours.  SURGICAL  WAITING ROOM VISITATION Patients may have no more than 2 support people in the waiting area - these visitors may rotate.   Pre-op nurse will coordinate an appropriate time for 1 ADULT support person, who may not rotate, to accompany patient in pre-op.  Children under the age of 47 must have an adult with them who is not the patient and must remain in the main waiting area with an adult.  If the patient needs to stay at the hospital during part of their recovery, the visitor guidelines for inpatient rooms apply.  Please refer to the [facility] website for the visitor guidelines for any additional information.   If you received a COVID test during your pre-op visit  it is requested that you wear a mask when out in public, stay away from anyone that may not be feeling well and notify your surgeon if you develop symptoms. If you have been in contact with anyone that has tested positive in the last 10 days please notify you surgeon.      Pre-operative CHG Bathing Instructions   You can play a key role in reducing the risk of infection after surgery. Your skin needs to be as free of germs as possible. You can reduce the number of germs on your skin by washing with CHG (chlorhexidine  gluconate) soap before surgery. CHG is an antiseptic soap that kills germs and continues to kill germs even after washing.   DO NOT use if you have an allergy to chlorhexidine /CHG  or antibacterial soaps. If your skin becomes reddened or irritated, stop using the CHG and notify one of our RNs at [phone].              TAKE A SHOWER THE NIGHT BEFORE SURGERY   Please keep in mind the following:  DO NOT shave, including legs and underarms, 48 hours prior to surgery.   You may shave your face before/day of surgery.  Place clean sheets on your bed the night before surgery Use a clean washcloth (not used since being washed) for shower. DO NOT sleep with pet's night before surgery.  CHG Shower Instructions:  Wash  your face and private area with normal soap. If you choose to wash your hair, wash first with your normal shampoo.  After you use shampoo/soap, rinse your hair and body thoroughly to remove shampoo/soap residue.  Turn the water OFF and apply half the bottle of CHG soap to a CLEAN washcloth.  Apply CHG soap ONLY FROM YOUR NECK DOWN TO YOUR TOES (washing for 3-5 minutes)  DO NOT use CHG soap on face, private areas, open wounds, or sores.  Pay special attention to the area where your surgery is being performed.  If you are having back surgery, having someone wash your back for you may be helpful. Wait 2 minutes after CHG soap is applied, then you may rinse off the CHG soap.  Pat dry with a clean towel  Put on clean pajamas    Additional instructions for the day of surgery: If you choose, you may shower the morning of surgery with an antibacterial soap.  DO NOT APPLY any lotions, deodorants, cologne, or perfumes.   Do not wear jewelry or makeup Do not wear nail polish, gel polish, artificial nails, or any other type of covering on natural nails (fingers and toes) Do not bring valuables to the hospital. [facility] is not responsible for valuables/personal belongings. Put on clean/comfortable clothes.  Please brush your teeth.  Ask your nurse before applying any prescription medications to the skin.

## 2024-10-18 NOTE — Anesthesia Preprocedure Evaluation (Addendum)
 Anesthesia Evaluation  Patient identified by MRN, date of birth, ID band Patient awake    Reviewed: Allergy & Precautions, NPO status , Patient's Chart, lab work & pertinent test results  History of Anesthesia Complications (+) PONV and history of anesthetic complications  Airway Mallampati: II  TM Distance: >3 FB Neck ROM: Full    Dental no notable dental hx. (+) Teeth Intact, Dental Advisory Given   Pulmonary asthma , sleep apnea    Pulmonary exam normal breath sounds clear to auscultation       Cardiovascular hypertension, Pt. on medications (-) angina + Past MI  Normal cardiovascular exam Rhythm:Regular Rate:Normal     Neuro/Psych  Headaches PSYCHIATRIC DISORDERS Anxiety Depression       GI/Hepatic ,GERD  Medicated and Controlled,,diverticulitis   Endo/Other  neg diabetes    Renal/GU Lab Results      Component                Value               Date                      NA                       139                 10/15/2024                CL                       105                 10/15/2024                K                        4.1                 10/15/2024                CO2                      23                  10/15/2024                BUN                      15                  10/15/2024                CREATININE               1.00                10/15/2024                GFRNONAA                 >60                 10/15/2024                CALCIUM                   8.4 (  L)             10/15/2024                ALBUMIN                   3.3 (L)             10/15/2024                GLUCOSE                  86                  10/15/2024                Musculoskeletal  (+) Arthritis ,    Abdominal   Peds  Hematology Lab Results      Component                Value               Date                      WBC                      7.2                 10/15/2024                HGB                       14.7                10/15/2024                HCT                      45.2                10/15/2024                MCV                      91.1                10/15/2024                PLT                      287                 10/15/2024              Anesthesia Other Findings  ALL;   LAPAROSCOPIC PARTIAL COLECTOMY - LAPAROSCOPIC PARTIAL COLECTOMY WITH PRIMARY ANASTOMOSIS      CYSTOSCOPY WITH INDOCYANINE GREEN  IMAGING (    Reproductive/Obstetrics                              Anesthesia Physical Anesthesia Plan  ASA: 3  Anesthesia Plan: General   Post-op Pain Management: Lidocaine  infusion*, Ketamine  IV*, Toradol  IV (intra-op)* and Tylenol  PO (pre-op)*   Induction: Intravenous  PONV Risk Score and Plan: 4 or greater and TIVA, Treatment may vary due to age or medical condition, Ondansetron  and Dexamethasone   Airway Management Planned: Oral ETT  Additional Equipment: None  Intra-op Plan:   Post-operative Plan: Extubation in OR  Informed Consent: I have reviewed the patients History and Physical, chart, labs and discussed the procedure including the risks, benefits and alternatives for the proposed anesthesia with the patient or authorized representative who has indicated his/her understanding and acceptance.     Dental advisory given  Plan Discussed with: CRNA and Surgeon  Anesthesia Plan Comments:          Anesthesia Quick Evaluation

## 2024-10-19 ENCOUNTER — Inpatient Hospital Stay (HOSPITAL_COMMUNITY): Admitting: Anesthesiology

## 2024-10-19 ENCOUNTER — Encounter (HOSPITAL_COMMUNITY)
Admission: RE | Disposition: A | Discharge status: Home / Self Care | Payer: Self-pay | Source: Home / Self Care | Attending: General Surgery

## 2024-10-19 ENCOUNTER — Other Ambulatory Visit: Payer: Self-pay

## 2024-10-19 ENCOUNTER — Inpatient Hospital Stay (HOSPITAL_COMMUNITY)
Admission: RE | Admit: 2024-10-19 | Discharge: 2024-10-29 | DRG: 330 | Disposition: A | Discharge status: Home / Self Care | Attending: General Surgery | Admitting: General Surgery

## 2024-10-19 ENCOUNTER — Encounter (HOSPITAL_COMMUNITY): Payer: Self-pay | Admitting: General Surgery

## 2024-10-19 DIAGNOSIS — K219 Gastro-esophageal reflux disease without esophagitis: Secondary | ICD-10-CM | POA: Diagnosis present

## 2024-10-19 DIAGNOSIS — Z833 Family history of diabetes mellitus: Secondary | ICD-10-CM

## 2024-10-19 DIAGNOSIS — J45909 Unspecified asthma, uncomplicated: Secondary | ICD-10-CM

## 2024-10-19 DIAGNOSIS — Z885 Allergy status to narcotic agent status: Secondary | ICD-10-CM

## 2024-10-19 DIAGNOSIS — F418 Other specified anxiety disorders: Secondary | ICD-10-CM | POA: Diagnosis not present

## 2024-10-19 DIAGNOSIS — F909 Attention-deficit hyperactivity disorder, unspecified type: Secondary | ICD-10-CM | POA: Diagnosis present

## 2024-10-19 DIAGNOSIS — Z860100 Personal history of colon polyps, unspecified: Secondary | ICD-10-CM

## 2024-10-19 DIAGNOSIS — D62 Acute posthemorrhagic anemia: Secondary | ICD-10-CM | POA: Diagnosis not present

## 2024-10-19 DIAGNOSIS — E66813 Obesity, class 3: Secondary | ICD-10-CM | POA: Diagnosis present

## 2024-10-19 DIAGNOSIS — I10 Essential (primary) hypertension: Secondary | ICD-10-CM

## 2024-10-19 DIAGNOSIS — F32A Depression, unspecified: Secondary | ICD-10-CM | POA: Diagnosis present

## 2024-10-19 DIAGNOSIS — Z90710 Acquired absence of both cervix and uterus: Secondary | ICD-10-CM

## 2024-10-19 DIAGNOSIS — Z9102 Food additives allergy status: Secondary | ICD-10-CM

## 2024-10-19 DIAGNOSIS — F419 Anxiety disorder, unspecified: Secondary | ICD-10-CM | POA: Diagnosis present

## 2024-10-19 DIAGNOSIS — Z9109 Other allergy status, other than to drugs and biological substances: Secondary | ICD-10-CM

## 2024-10-19 DIAGNOSIS — Z6841 Body Mass Index (BMI) 40.0 and over, adult: Secondary | ICD-10-CM

## 2024-10-19 DIAGNOSIS — K5790 Diverticulosis of intestine, part unspecified, without perforation or abscess without bleeding: Secondary | ICD-10-CM

## 2024-10-19 DIAGNOSIS — Z9049 Acquired absence of other specified parts of digestive tract: Principal | ICD-10-CM

## 2024-10-19 DIAGNOSIS — Z800 Family history of malignant neoplasm of digestive organs: Secondary | ICD-10-CM

## 2024-10-19 DIAGNOSIS — K5720 Diverticulitis of large intestine with perforation and abscess without bleeding: Principal | ICD-10-CM | POA: Diagnosis present

## 2024-10-19 DIAGNOSIS — Z981 Arthrodesis status: Secondary | ICD-10-CM

## 2024-10-19 DIAGNOSIS — L259 Unspecified contact dermatitis, unspecified cause: Secondary | ICD-10-CM | POA: Diagnosis not present

## 2024-10-19 HISTORY — PX: LAPAROSCOPIC ABDOMINAL EXPLORATION: SHX6249

## 2024-10-19 HISTORY — PX: FLEXIBLE SIGMOIDOSCOPY: SHX5431

## 2024-10-19 HISTORY — PX: CYSTOSCOPY WITH INDOCYANINE GREEN IMAGING (ICG): SHX7549

## 2024-10-19 HISTORY — PX: LAPAROSCOPIC PARTIAL COLECTOMY: SHX5907

## 2024-10-19 LAB — CBC
HCT: 39.5 % (ref 36.0–46.0)
Hemoglobin: 13.3 g/dL (ref 12.0–15.0)
MCH: 30.2 pg (ref 26.0–34.0)
MCHC: 33.7 g/dL (ref 30.0–36.0)
MCV: 89.6 fL (ref 80.0–100.0)
Platelets: 303 K/uL (ref 150–400)
RBC: 4.41 MIL/uL (ref 3.87–5.11)
RDW: 12.4 % (ref 11.5–15.5)
WBC: 16.7 K/uL — ABNORMAL HIGH (ref 4.0–10.5)
nRBC: 0 % (ref 0.0–0.2)

## 2024-10-19 LAB — GLUCOSE, CAPILLARY: Glucose-Capillary: 174 mg/dL — ABNORMAL HIGH (ref 70–99)

## 2024-10-19 SURGERY — LAPAROSCOPIC PARTIAL COLECTOMY
Anesthesia: General | Site: Rectum

## 2024-10-19 MED ORDER — CHLORHEXIDINE GLUCONATE CLOTH 2 % EX PADS: 6.0000 | MEDICATED_PAD | Freq: Once | CUTANEOUS | Status: DC

## 2024-10-19 MED ORDER — SODIUM CHLORIDE 0.9 % IR SOLN
Status: DC | PRN
  Administered 2024-10-19: 1000 mL via INTRAVESICAL

## 2024-10-19 MED ORDER — ONDANSETRON HCL 4 MG/2ML IJ SOLN
INTRAMUSCULAR | Status: AC
  Filled 2024-10-19: qty 2

## 2024-10-19 MED ORDER — LIDOCAINE IN D5W 4-5 MG/ML-% IV SOLN
1.0000 mg/min | INTRAVENOUS | Status: AC
  Administered 2024-10-19: 1.5 mg/min via INTRAVENOUS
  Filled 2024-10-19: qty 500

## 2024-10-19 MED ORDER — KCL IN DEXTROSE-NACL 20-5-0.45 MEQ/L-%-% IV SOLN
INTRAVENOUS | Status: AC
  Filled 2024-10-19 (×4): qty 1000

## 2024-10-19 MED ORDER — ENSURE PRE-SURGERY PO LIQD: 296.0000 mL | Freq: Once | ORAL | Status: DC

## 2024-10-19 MED ORDER — BUPROPION HCL ER (XL) 150 MG PO TB24
300.0000 mg | ORAL_TABLET | Freq: Every day | ORAL | Status: DC
  Administered 2024-10-21 – 2024-10-29 (×7): 300 mg via ORAL
  Filled 2024-10-19 (×11): qty 2

## 2024-10-19 MED ORDER — CHLORHEXIDINE GLUCONATE CLOTH 2 % EX PADS
6.0000 | MEDICATED_PAD | Freq: Every day | CUTANEOUS | Status: DC
  Administered 2024-10-19 – 2024-10-27 (×8): 6 via TOPICAL

## 2024-10-19 MED ORDER — PROPOFOL 1000 MG/100ML IV EMUL
INTRAVENOUS | Status: AC
  Filled 2024-10-19: qty 100

## 2024-10-19 MED ORDER — ACETAMINOPHEN 500 MG PO TABS
1000.0000 mg | ORAL_TABLET | Freq: Once | ORAL | Status: AC
  Filled 2024-10-19: qty 2

## 2024-10-19 MED ORDER — PROPOFOL 500 MG/50ML IV EMUL
INTRAVENOUS | Status: DC | PRN
  Administered 2024-10-19: 125 ug/kg/min via INTRAVENOUS

## 2024-10-19 MED ORDER — KETOROLAC TROMETHAMINE 30 MG/ML IJ SOLN: 30.0000 mg | Freq: Once | INTRAMUSCULAR | Status: DC | PRN

## 2024-10-19 MED ORDER — ENSURE PRE-SURGERY PO LIQD: 592.0000 mL | Freq: Once | ORAL | Status: DC

## 2024-10-19 MED ORDER — ONDANSETRON HCL 4 MG PO TABS
4.0000 mg | ORAL_TABLET | Freq: Four times a day (QID) | ORAL | Status: DC | PRN
  Administered 2024-10-22 – 2024-10-23 (×3): 4 mg via ORAL
  Filled 2024-10-19 (×5): qty 1

## 2024-10-19 MED ORDER — FENTANYL CITRATE (PF) 50 MCG/ML IJ SOSY
25.0000 ug | PREFILLED_SYRINGE | INTRAMUSCULAR | Status: DC | PRN
  Administered 2024-10-19: 25 ug via INTRAVENOUS
  Filled 2024-10-19 (×2): qty 1

## 2024-10-19 MED ORDER — INDOCYANINE GREEN 25 MG IV SOLR
INTRAVENOUS | Status: DC | PRN
  Administered 2024-10-19: 25 mg

## 2024-10-19 MED ORDER — KETAMINE HCL 10 MG/ML IJ SOLN
INTRAMUSCULAR | Status: DC | PRN
  Administered 2024-10-19 (×3): 10 mg via INTRAVENOUS
  Administered 2024-10-19: 20 mg via INTRAVENOUS

## 2024-10-19 MED ORDER — HYDRALAZINE HCL 20 MG/ML IJ SOLN: 10.0000 mg | Freq: Four times a day (QID) | INTRAMUSCULAR | Status: DC | PRN

## 2024-10-19 MED ORDER — FENTANYL CITRATE (PF) 100 MCG/2ML IJ SOLN
INTRAMUSCULAR | Status: AC
  Filled 2024-10-19: qty 2

## 2024-10-19 MED ORDER — FENTANYL CITRATE (PF) 100 MCG/2ML IJ SOLN
25.0000 ug | INTRAMUSCULAR | Status: DC | PRN
  Administered 2024-10-19: 50 ug via INTRAVENOUS

## 2024-10-19 MED ORDER — BUPIVACAINE-EPINEPHRINE 0.25% -1:200000 IJ SOLN
INTRAMUSCULAR | Status: DC | PRN
  Administered 2024-10-19: 60 mL

## 2024-10-19 MED ORDER — LACTATED RINGERS IV SOLN: INTRAVENOUS | Status: DC | PRN

## 2024-10-19 MED ORDER — ACETAMINOPHEN 500 MG PO TABS
1000.0000 mg | ORAL_TABLET | ORAL | Status: AC
  Administered 2024-10-19: 1000 mg via ORAL

## 2024-10-19 MED ORDER — ORAL CARE MOUTH RINSE: 15.0000 mL | Freq: Once | OROMUCOSAL | Status: AC

## 2024-10-19 MED ORDER — ONDANSETRON HCL 4 MG/2ML IJ SOLN
4.0000 mg | Freq: Four times a day (QID) | INTRAMUSCULAR | Status: DC | PRN
  Administered 2024-10-19 – 2024-10-26 (×10): 4 mg via INTRAVENOUS
  Filled 2024-10-19 (×10): qty 2

## 2024-10-19 MED ORDER — DEXAMETHASONE SOD PHOSPHATE PF 10 MG/ML IJ SOLN
INTRAMUSCULAR | Status: DC | PRN
  Administered 2024-10-19: 10 mg via INTRAVENOUS

## 2024-10-19 MED ORDER — SODIUM CHLORIDE 0.9 % IV SOLN
2.0000 g | INTRAVENOUS | Status: AC
  Administered 2024-10-19: 2 g via INTRAVENOUS
  Filled 2024-10-19: qty 2

## 2024-10-19 MED ORDER — ONDANSETRON HCL 4 MG/2ML IJ SOLN
INTRAMUSCULAR | Status: DC | PRN
  Administered 2024-10-19: 4 mg via INTRAVENOUS

## 2024-10-19 MED ORDER — FENTANYL CITRATE (PF) 250 MCG/5ML IJ SOLN
INTRAMUSCULAR | Status: DC | PRN
  Administered 2024-10-19 (×2): 50 ug via INTRAVENOUS
  Administered 2024-10-19: 100 ug via INTRAVENOUS
  Administered 2024-10-19: 50 ug via INTRAVENOUS

## 2024-10-19 MED ORDER — ROCURONIUM BROMIDE 10 MG/ML (PF) SYRINGE
PREFILLED_SYRINGE | INTRAVENOUS | Status: DC | PRN
  Administered 2024-10-19: 60 mg via INTRAVENOUS
  Administered 2024-10-19 (×2): 10 mg via INTRAVENOUS

## 2024-10-19 MED ORDER — 0.9 % SODIUM CHLORIDE (POUR BTL) OPTIME
TOPICAL | Status: DC | PRN
  Administered 2024-10-19: 2000 mL

## 2024-10-19 MED ORDER — KETAMINE HCL 50 MG/5ML IJ SOSY
PREFILLED_SYRINGE | INTRAMUSCULAR | Status: AC
  Filled 2024-10-19: qty 5

## 2024-10-19 MED ORDER — OXYCODONE HCL 5 MG PO TABS: 5.0000 mg | ORAL_TABLET | Freq: Once | ORAL | Status: DC | PRN

## 2024-10-19 MED ORDER — SUGAMMADEX SODIUM 200 MG/2ML IV SOLN
INTRAVENOUS | Status: DC | PRN
Start: 2024-10-19 — End: 2024-10-19
  Administered 2024-10-19: 200 mg via INTRAVENOUS

## 2024-10-19 MED ORDER — LIDOCAINE HCL (CARDIAC) PF 50 MG/5ML IV SOSY
PREFILLED_SYRINGE | INTRAVENOUS | Status: DC | PRN
  Administered 2024-10-19: 100 mg via INTRAVENOUS

## 2024-10-19 MED ORDER — OXYCODONE HCL 5 MG PO TABS
5.0000 mg | ORAL_TABLET | ORAL | Status: DC | PRN
  Administered 2024-10-19 – 2024-10-20 (×3): 5 mg via ORAL
  Filled 2024-10-19 (×3): qty 1

## 2024-10-19 MED ORDER — SIMETHICONE 80 MG PO CHEW
40.0000 mg | CHEWABLE_TABLET | Freq: Four times a day (QID) | ORAL | Status: DC | PRN
  Administered 2024-10-23: 40 mg via ORAL
  Filled 2024-10-19: qty 1

## 2024-10-19 MED ORDER — DEXMEDETOMIDINE HCL IN NACL 80 MCG/20ML IV SOLN
INTRAVENOUS | Status: AC
  Filled 2024-10-19: qty 20

## 2024-10-19 MED ORDER — ALVIMOPAN 12 MG PO CAPS
12.0000 mg | ORAL_CAPSULE | Freq: Two times a day (BID) | ORAL | Status: DC
  Administered 2024-10-20 – 2024-10-21 (×4): 12 mg via ORAL
  Filled 2024-10-19 (×5): qty 1

## 2024-10-19 MED ORDER — PHENYLEPHRINE HCL-NACL 20-0.9 MG/250ML-% IV SOLN
INTRAVENOUS | Status: AC
  Filled 2024-10-19: qty 250

## 2024-10-19 MED ORDER — LACTATED RINGERS IV SOLN
INTRAVENOUS | Status: DC
Start: 2024-10-19 — End: 2024-10-19

## 2024-10-19 MED ORDER — ACETAMINOPHEN 500 MG PO TABS
1000.0000 mg | ORAL_TABLET | Freq: Four times a day (QID) | ORAL | Status: AC
  Administered 2024-10-19 – 2024-10-26 (×24): 1000 mg via ORAL
  Filled 2024-10-19 (×27): qty 2

## 2024-10-19 MED ORDER — BUPIVACAINE-EPINEPHRINE (PF) 0.25% -1:200000 IJ SOLN
INTRAMUSCULAR | Status: AC
  Filled 2024-10-19: qty 30

## 2024-10-19 MED ORDER — SCOPOLAMINE 1 MG/3DAYS TD PT72
1.0000 | MEDICATED_PATCH | TRANSDERMAL | Status: DC
  Administered 2024-10-19: 1 mg via TRANSDERMAL
  Filled 2024-10-19: qty 1

## 2024-10-19 MED ORDER — METHOCARBAMOL 500 MG PO TABS
500.0000 mg | ORAL_TABLET | Freq: Three times a day (TID) | ORAL | Status: DC
  Administered 2024-10-19: 500 mg via ORAL
  Filled 2024-10-19: qty 1

## 2024-10-19 MED ORDER — ENOXAPARIN SODIUM 40 MG/0.4ML IJ SOSY
40.0000 mg | PREFILLED_SYRINGE | Freq: Once | INTRAMUSCULAR | Status: AC
  Administered 2024-10-19: 40 mg via SUBCUTANEOUS
  Filled 2024-10-19: qty 0.4

## 2024-10-19 MED ORDER — ONDANSETRON HCL 4 MG/2ML IJ SOLN
4.0000 mg | Freq: Once | INTRAMUSCULAR | Status: AC | PRN
  Administered 2024-10-19: 4 mg via INTRAVENOUS

## 2024-10-19 MED ORDER — ALVIMOPAN 12 MG PO CAPS
12.0000 mg | ORAL_CAPSULE | ORAL | Status: AC
  Administered 2024-10-19: 12 mg via ORAL
  Filled 2024-10-19: qty 1

## 2024-10-19 MED ORDER — SODIUM CHLORIDE FLUSH 0.9 % IV SOLN
INTRAVENOUS | Status: DC | PRN
  Administered 2024-10-19: 10 mL

## 2024-10-19 MED ORDER — ENOXAPARIN SODIUM 40 MG/0.4ML IJ SOSY: 40.0000 mg | PREFILLED_SYRINGE | INTRAMUSCULAR | Status: DC

## 2024-10-19 MED ORDER — ALBUMIN HUMAN 5 % IV SOLN: INTRAVENOUS | Status: DC | PRN

## 2024-10-19 MED ORDER — OXYCODONE HCL 5 MG/5ML PO SOLN: 5.0000 mg | Freq: Once | ORAL | Status: DC | PRN

## 2024-10-19 MED ORDER — GABAPENTIN 300 MG PO CAPS
300.0000 mg | ORAL_CAPSULE | Freq: Two times a day (BID) | ORAL | Status: DC
  Administered 2024-10-19: 300 mg via ORAL
  Filled 2024-10-19: qty 1

## 2024-10-19 MED ORDER — PROPOFOL 10 MG/ML IV BOLUS
INTRAVENOUS | Status: AC
  Filled 2024-10-19: qty 20

## 2024-10-19 MED ORDER — MIDAZOLAM HCL (PF) 2 MG/2ML IJ SOLN
INTRAMUSCULAR | Status: DC | PRN
  Administered 2024-10-19: 2 mg via INTRAVENOUS

## 2024-10-19 MED ORDER — FENTANYL CITRATE (PF) 250 MCG/5ML IJ SOLN
INTRAMUSCULAR | Status: AC
  Filled 2024-10-19: qty 5

## 2024-10-19 MED ORDER — PROPOFOL 10 MG/ML IV BOLUS
INTRAVENOUS | Status: DC | PRN
  Administered 2024-10-19: 150 mg via INTRAVENOUS

## 2024-10-19 MED ORDER — GABAPENTIN 300 MG PO CAPS
300.0000 mg | ORAL_CAPSULE | ORAL | Status: AC
  Administered 2024-10-19: 300 mg via ORAL
  Filled 2024-10-19: qty 1

## 2024-10-19 MED ORDER — MIDAZOLAM HCL 2 MG/2ML IJ SOLN
INTRAMUSCULAR | Status: AC
Start: 2024-10-19 — End: 2024-10-19
  Filled 2024-10-19: qty 2

## 2024-10-19 MED ORDER — SODIUM CHLORIDE (PF) 0.9 % IJ SOLN
INTRAMUSCULAR | Status: AC
  Filled 2024-10-19: qty 10

## 2024-10-19 MED ORDER — ENSURE SURGERY PO LIQD
237.0000 mL | Freq: Two times a day (BID) | ORAL | Status: DC
  Administered 2024-10-21: 237 mL via ORAL
  Filled 2024-10-19 (×4): qty 237

## 2024-10-19 MED ORDER — CHLORHEXIDINE GLUCONATE 0.12 % MT SOLN
15.0000 mL | Freq: Once | OROMUCOSAL | Status: AC
Start: 2024-10-19 — End: 2024-10-19
  Administered 2024-10-19: 15 mL via OROMUCOSAL
  Filled 2024-10-19: qty 15

## 2024-10-19 MED ORDER — CELECOXIB 200 MG PO CAPS
200.0000 mg | ORAL_CAPSULE | ORAL | Status: AC
  Administered 2024-10-19: 200 mg via ORAL
  Filled 2024-10-19: qty 1

## 2024-10-19 SURGICAL SUPPLY — 91 items
BAG COUNTER SPONGE SURGICOUNT (BAG) ×3 IMPLANT
BAG DRAIN URO-CYSTO SKYTR STRL (DRAIN) ×3 IMPLANT
BAG URINE DRAIN 2000ML AR STRL (UROLOGICAL SUPPLIES) ×3 IMPLANT
BLADE CLIPPER SURG (BLADE) IMPLANT
CANISTER SUCTION 3000ML PPV (SUCTIONS) ×3 IMPLANT
CATH ROBINSON RED A/P 14FR (CATHETERS) IMPLANT
CATH URET 5FR 28IN OPEN ENDED (CATHETERS) IMPLANT
CATH URET 5FR 70CM CONE TIP (BALLOONS) IMPLANT
CATH URETL OPEN END 6FR 70 (CATHETERS) IMPLANT
CHLORAPREP W/TINT 26 (MISCELLANEOUS) ×3 IMPLANT
COVER SURGICAL LIGHT HANDLE (MISCELLANEOUS) ×6 IMPLANT
DERMABOND ADVANCED .7 DNX12 (GAUZE/BANDAGES/DRESSINGS) ×3 IMPLANT
DRSG OPSITE POSTOP 4X10 (GAUZE/BANDAGES/DRESSINGS) IMPLANT
DRSG OPSITE POSTOP 4X8 (GAUZE/BANDAGES/DRESSINGS) IMPLANT
ELECT BLADE 6.5 EXT (BLADE) ×3 IMPLANT
ELECT CAUTERY BLADE 6.4 (BLADE) ×3 IMPLANT
ELECTRODE REM PT RTRN 9FT ADLT (ELECTROSURGICAL) ×3 IMPLANT
GEL ULTRASOUND 20GR AQUASONIC (MISCELLANEOUS) IMPLANT
GLOVE BIOGEL M STRL SZ7.5 (GLOVE) ×3 IMPLANT
GLOVE BIOGEL PI IND STRL 9 (GLOVE) ×3 IMPLANT
GLOVE BIOGEL PI MICRO STRL 6 (GLOVE) ×6 IMPLANT
GLOVE INDICATOR 6.5 STRL GRN (GLOVE) ×6 IMPLANT
GOWN STRL REUS W/ TWL LRG LVL3 (GOWN DISPOSABLE) ×24 IMPLANT
GOWN STRL REUS W/ TWL XL LVL3 (GOWN DISPOSABLE) ×6 IMPLANT
GUIDEWIRE ANG ZIPWIRE 038X150 (WIRE) IMPLANT
GUIDEWIRE STR DUAL SENSOR (WIRE) IMPLANT
HANDLE SUCTION POOLE (INSTRUMENTS) IMPLANT
IRRIGATION SUCT STRKRFLW 2 WTP (MISCELLANEOUS) IMPLANT
KIT BASIN OR (CUSTOM PROCEDURE TRAY) ×3 IMPLANT
KIT IMAGING PINPOINTPAQ (MISCELLANEOUS) IMPLANT
KIT SIGMOIDOSCOPE (SET/KITS/TRAYS/PACK) IMPLANT
KIT TURNOVER KIT B (KITS) ×3 IMPLANT
LHOOK LAP DISP 36CM (ELECTROSURGICAL) IMPLANT
LIGASURE LAP MARYLAND 5MM 37CM (ELECTROSURGICAL) IMPLANT
MANIFOLD NEPTUNE II (INSTRUMENTS) IMPLANT
NDL 18GX1X1/2 (RX/OR ONLY) (NEEDLE) ×3 IMPLANT
NDL 22X1.5 STRL (OR ONLY) (MISCELLANEOUS) ×3 IMPLANT
NDL INSUFFLATION 14GA 120MM (NEEDLE) ×3 IMPLANT
NEEDLE 18GX1X1/2 (RX/OR ONLY) (NEEDLE) ×3 IMPLANT
NEEDLE 22X1.5 STRL (OR ONLY) (MISCELLANEOUS) ×3 IMPLANT
NEEDLE INSUFFLATION 14GA 120MM (NEEDLE) ×3 IMPLANT
PACK COLON (CUSTOM PROCEDURE TRAY) ×3 IMPLANT
PACK CYSTO (CUSTOM PROCEDURE TRAY) ×3 IMPLANT
PAD ARMBOARD POSITIONER FOAM (MISCELLANEOUS) ×6 IMPLANT
POUCH LAPAROSCOPIC INSTRUMENT (MISCELLANEOUS) ×3 IMPLANT
RELOAD STAPLE 60 4.1 GRN THCK (STAPLE) IMPLANT
RELOAD STAPLE 75 3.8 BLU REG (ENDOMECHANICALS) IMPLANT
RETRACTOR WND ALEXIS 18 MED (MISCELLANEOUS) IMPLANT
SAVER CELL AAL HAEMONETICS (INSTRUMENTS) IMPLANT
SCISSORS LAP 5X35 DISP (ENDOMECHANICALS) ×3 IMPLANT
SET TUBE SMOKE EVAC HIGH FLOW (TUBING) ×3 IMPLANT
SHEARS HARMONIC 36 ACE (MISCELLANEOUS) IMPLANT
SLEEVE Z-THREAD 5X100MM (TROCAR) ×6 IMPLANT
SOLN 0.9% NACL POUR BTL 1000ML (IV SOLUTION) ×6 IMPLANT
SOLN STERILE WATER BTL 1000 ML (IV SOLUTION) ×3 IMPLANT
SPECIMEN JAR LARGE (MISCELLANEOUS) ×3 IMPLANT
STAPLER CIRCULAR MANUAL XL 29 (STAPLE) IMPLANT
STAPLER POWER ECHELON 60 WIDE (STAPLE) IMPLANT
STAPLER PROXIMATE 75MM BLUE (STAPLE) IMPLANT
STAPLER SKIN PROX 35W (STAPLE) IMPLANT
STENT URET 6FRX24 CONTOUR (STENTS) IMPLANT
STENT URET 6FRX26 CONTOUR (STENTS) IMPLANT
SURGILUBE 2OZ TUBE FLIPTOP (MISCELLANEOUS) IMPLANT
SUT ETHILON 2 0 FS 18 (SUTURE) IMPLANT
SUT MNCRL AB 4-0 PS2 18 (SUTURE) ×3 IMPLANT
SUT PDS AB 1 TP1 54 (SUTURE) ×6 IMPLANT
SUT PROLENE 2 0 CT2 30 (SUTURE) IMPLANT
SUT PROLENE 2 0 KS (SUTURE) IMPLANT
SUT SILK 2 0 SH CR/8 (SUTURE) IMPLANT
SUT VIC AB 2-0 SH 18 (SUTURE) IMPLANT
SUT VIC AB 2-0 SH 27XBRD (SUTURE) IMPLANT
SUT VIC AB 3-0 SH 18 (SUTURE) IMPLANT
SUT VIC AB 3-0 SH 8-18 (SUTURE) IMPLANT
SUT VIC AB 4-0 SH 18 (SUTURE) IMPLANT
SUT VICRYL 0 UR6 27IN ABS (SUTURE) IMPLANT
SUT VICRYL AB 2 0 TIES (SUTURE) IMPLANT
SUT VICRYL AB 3 0 TIES (SUTURE) IMPLANT
SYPHON OMNI JUG (MISCELLANEOUS) ×3 IMPLANT
SYR BULB IRRIG 60ML STRL (SYRINGE) ×3 IMPLANT
SYSTEM LAPSCP GELPORT 120MM (MISCELLANEOUS) IMPLANT
SYSTEM WOUND ALEXIS 18CM MED (MISCELLANEOUS) IMPLANT
TOWEL GREEN STERILE (TOWEL DISPOSABLE) ×6 IMPLANT
TOWEL GREEN STERILE FF (TOWEL DISPOSABLE) ×3 IMPLANT
TRAY FOLEY MTR SLVR 16FR STAT (SET/KITS/TRAYS/PACK) ×3 IMPLANT
TROCAR 11X100 Z THREAD (TROCAR) IMPLANT
TROCAR BALLN 12MMX100 BLUNT (TROCAR) IMPLANT
TROCAR Z THREAD OPTICAL 12X100 (TROCAR) IMPLANT
TROCAR Z-THREAD OPTICAL 5X100M (TROCAR) ×3 IMPLANT
TUBE CONNECTING 12X1/4 (SUCTIONS) ×9 IMPLANT
WARMER LAPAROSCOPE (MISCELLANEOUS) ×3 IMPLANT
WATER STERILE IRR 3000ML UROMA (IV SOLUTION) IMPLANT

## 2024-10-19 NOTE — Plan of Care (Signed)
  Problem: Education: Goal: Understanding of discharge needs will improve Outcome: Progressing   Problem: Education: Goal: Verbalization of understanding of the causes of altered bowel function will improve Outcome: Progressing   Problem: Bowel/Gastric: Goal: Gastrointestinal status for postoperative course will improve Outcome: Progressing   Problem: Activity: Goal: Ability to tolerate increased activity will improve Outcome: Progressing   Problem: Skin Integrity: Goal: Will show signs of wound healing Outcome: Progressing

## 2024-10-19 NOTE — Op Note (Signed)
 Preoperative diagnosis:  Pelvic Abscess   Diverticulitis Postoperative diagnosis:  Same   Procedure: Cystoscopy Instillation of ureteral firefly constrast   Surgeon: Morene MICAEL Salines, MD   Anesthesia: General   Complications: None   Intraoperative findings: normal appearing bladder, orthotopic UOs.   EBL: Minimal   Specimens: None   Indication:  Christina Potts  is a 53 y.o.  patient with diverticular abscess.  Dr. Ann requested cystoscopy and instillation of firefly contrast to help facilitate the dissection of the sigmoid colon.  After reviewing the management options for treatment, he elected to proceed with the above surgical procedure(s). We have discussed the potential benefits and risks of the procedure, side effects of the proposed treatment, the likelihood of the patient achieving the goals of the procedure, and any potential problems that might occur during the procedure or recuperation. Informed consent has been obtained.   Description of procedure:   The patient was taken to the operating room and general anesthesia was induced.  The patient was placed in the dorsal lithotomy position, prepped and draped in the usual sterile fashion, and preoperative antibiotics were administered. A preoperative time-out was performed.    A 21 French 30 degree cystoscope was gently passed through the patient's urethra into the bladder.  The bladder was subsequently emptied and then filled slowly up performing a 360 degrees cystoscopic evaluation.  This demonstrated orthotopic ureteral orifices, normal bladder mucosa with an area of heaped mucosa and bullous edema in the dome -  evidence of colovesical fistula without mucosal abnormality.   I then advanced a 5 French open-ended ureteral catheter into the patient's left ureteral orifice and  I then advanced the catheter up into the proximal ureter and then slowly pulled back and injected 7.27ml of the firefly contrast.   Subsequently turned my attention to the patient's right ureteral orifice and performed a similar task.   I then  placed a 16 French Foley.    The surgery was then turned over to Dr. Ann for facilitation of the remainder of the case.

## 2024-10-19 NOTE — Transfer of Care (Signed)
 Immediate Anesthesia Transfer of Care Note  Patient: Christina Potts  Procedure(s) Performed: LAPAROSCOPIC PARTIAL COLECTOMY SIGMOIDOSCOPY, FLEXIBLE (Rectum) EXPLORATION, ABDOMEN (Abdomen) CYSTOSCOPY WITH INDOCYANINE GREEN  IMAGING (ICG)  Patient Location: PACU  Anesthesia Type:General  Level of Consciousness: awake and drowsy  Airway & Oxygen Therapy: Patient Spontanous Breathing and Patient connected to face mask oxygen  Post-op Assessment: Report given to RN and Post -op Vital signs reviewed and stable  Post vital signs: Reviewed and stable  Last Vitals:  Vitals Value Taken Time  BP 108/65 10/19/24 15:30  Temp    Pulse 73 10/19/24 15:35  Resp 14 10/19/24 15:35  SpO2 96 % 10/19/24 15:35  Vitals shown include unfiled device data.  Last Pain:  Vitals:   10/19/24 1016  TempSrc:   PainSc: 2       Patients Stated Pain Goal: 2 (10/19/24 1016)  Complications: No notable events documented.

## 2024-10-19 NOTE — Anesthesia Procedure Notes (Signed)
 Procedure Name: Intubation Date/Time: 10/19/2024 12:08 PM  Performed by: Jerrye Herring, CRNAPre-anesthesia Checklist: Patient identified, Emergency Drugs available, Suction available and Patient being monitored Patient Re-evaluated:Patient Re-evaluated prior to induction Oxygen Delivery Method: Circle System Utilized Preoxygenation: Pre-oxygenation with 100% oxygen Induction Type: IV induction Ventilation: Mask ventilation without difficulty Laryngoscope Size: Mac and 3 Grade View: Grade I Tube type: Oral Tube size: 7.0 mm Number of attempts: 1 Airway Equipment and Method: Stylet and Oral airway Placement Confirmation: ETT inserted through vocal cords under direct vision, positive ETCO2 and breath sounds checked- equal and bilateral Secured at: 23 cm Tube secured with: Tape Dental Injury: Teeth and Oropharynx as per pre-operative assessment

## 2024-10-19 NOTE — H&P (Signed)
 HPI  Christina Potts is an 53 y.o. female who was seen in clinic on 10/03/24 for recurrent diverticulitis  Patient was most recently admitted 1/26-1/30/2024 for acute uncomplicated descending colon diverticulitis and was managed with abx. She has had admissions in the past in 2021 for diverticulitis with 2.4cm abscess. Patient had recurrent pain in September of this year and she was given prescription for Augmentin . Augmentin  did not help symptoms so was prescribed cipro /flagyl  with relief. She states she has certain trigger foods, especially [employer] causes symptoms. She has nausea/emesis when she has flares. She is typically constipated at baseline and takes miralax  to help with this. Denies blood in stools  Most recent colonoscopy in April 2024 with moderate pancolonic diverticulosis predominantly in sigmoid colon. Biopsies taken consistent with normal colonic mucosa.  Patient states she had some spasm pains yesterday which she has sometimes with a flare but that has resolved and pain improved today.   10 point review of systems is negative except as listed above in HPI.  Objective  Past Medical History: Past Medical History:  Diagnosis Date   ADD (attention deficit disorder)    Anemia    as child   Anxiety    Arthritis    Asthma    Bowel obstruction (HCC)    BRCA negative 01/2013, 9/19   BRCA/Colaris neg; MyRisk neg 9/19   Chronic headaches    Colon polyps    2010-2011   DDD (degenerative disc disease), cervical    Depression    Diverticulitis    Diverticulosis    Family history of breast cancer 07/2018   IBIS=12.4%   GERD (gastroesophageal reflux disease)    History of snoring    Hypertension    IBS (irritable bowel syndrome)    Migraines    had one 2 weeks ago   Pneumonia    2014   PONV (postoperative nausea and vomiting)    Sleep apnea    pt does not wear CPAP   Spondylolisthesis of lumbar region    Umbilical hernia     Past Surgical History: Past  Surgical History:  Procedure Laterality Date   ABDOMINAL HYSTERECTOMY  11/08/2000   TAH RSO, LOA due to pelvic pain/menometrorrhagia. Dr. Van Dalen. Partical   ANTERIOR CERVICAL DECOMP/DISCECTOMY FUSION N/A 05/25/2018   Procedure: ANTERIOR CERVICAL DECOMPRESSION FUSION CERVICAL FOUR-FIVE,CERVICAL FIVE-SIX,CERVICAL SIX-SEVEN.;  Surgeon: Joshua Alm RAMAN, MD;  Location: [facility] OR;  Service: Neurosurgery;  Laterality: N/A;  right side approach   BACK SURGERY  08/2017   Lumbar surgery with Dr. Alm Joshua   CARPAL TUNNEL RELEASE Left    CESAREAN SECTION     X 2   COLONOSCOPY     2009   ESOPHAGOGASTRODUODENOSCOPY  02/13/2009   Minimal gastritis. Otherwise normal EGD.    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     WRIST SURGERY Left    Cartlige bremoved    Family History:  Family History  Problem Relation Age of Onset   Irritable bowel syndrome Mother    Liver cancer Maternal Grandmother        liver disease   Diabetes Maternal Grandmother    Kidney disease Maternal Grandfather    Diabetes Maternal Grandfather    Liver disease Maternal Grandfather    Lung cancer Maternal Aunt        60s   Diabetes Maternal Aunt    Lung cancer Maternal Aunt    Breast cancer Paternal Aunt  Prostate cancer Paternal Uncle    Ovarian cancer Cousin    Breast cancer Cousin    Brain cancer Cousin    Stomach cancer Cousin    Aneurysm Cousin    Colon cancer Cousin    Ovarian cancer Other    Breast cancer Other        5 cousins, 76-50 yrs old   Esophageal cancer Neg Hx    Pancreatic cancer Neg Hx     Social History:  reports that she has never smoked. She has never used smokeless tobacco. She reports that she does not currently use alcohol. She reports that she does not use drugs.  Allergies:  Allergies  Allergen Reactions   Dilaudid [Hydromorphone Hcl] Anaphylaxis and Shortness Of Breath    Patient reports 09/21/17 she tolerates oxycodone  fine   Strawberry (Diagnostic)  Anaphylaxis, Hives and Swelling   Morphine  And Codeine Rash and Other (See Comments)    Burns my skin  Pt states she can take codeine    Medications: I have reviewed the patient's current medications.  Labs: Pertinent lab work personally reviewed.  Imaging: Pertinent imaging personally reviewed  CT AP 09/28/24: Resolution of previous diverticulitis. Multiple diverticula in left hemicolon. CT AP 08/24/24: Diverticulosis with inflammatory stranding and wall thickening around distal descending colon consistent with acute diverticulitis.   Physical Exam There were no vitals taken for this visit. General: No acute distress, well appearing HEENT: PERRL, hearing grossly normal, mucous membranes moist CV: Regular rate and rhythm Pulm: Normal work of breathing on room air Abd: Soft, nondistended, minimal discomfort to deep palpation of left mid abdomen Extremities: Warm and well perfused Neuro: A&O x4, no focal neurologic deficits Psych: Appropriate mood and effect     Assessment   Christina Potts is an 53 y.o. female with recurrent diverticulitis  Plan  - Proceed to OR for laparoscopic assisted partial colectomy - We discussed the risks of operation including but not limited to: bleeding, deep and superficial surgical site infections including abscess, injury to surrounding structures with need for repair, reoperation, hernia, leak, possible ostomy, anesthesia risks including MI, CVA, and death. All questions were answered. Patient voiced understanding and agreed to proceed with surgery.    Orie Silversmith, MD General Surgery, Surgical Critical Care and Trauma

## 2024-10-19 NOTE — Consult Note (Addendum)
 WOC Nurse requested for preoperative stoma site marking  Discussed surgical procedure and stoma creation with patient and family.  Explained role of the WOC nurse team.  Provided the patient with educational booklet and provided samples of pouching options.  Answered patient and family questions.   Examined patient lying and sitting, in order to place the marking in the patient's visual field, away from any creases or abdominal contour issues and within the rectus muscle.  Attempted to mark below the patient's belt line, but this is not possible because patient could not visualize marking.    Marked for ileostomy in the RUQ  __ 7__cm to the right of the umbilicus and  _4___ cm above the umbilicus. A straight line was drawn through a deep crease on either side of umbilicus to highlight an area of concern.  Marked for colostomy in the LUQ  __ 5.5__ cm to the left of the umbilicus and  _3___ cm above the umbilicus.  Patient's abdomen cleansed with CHG wipes at site markings, allowed to air dry prior to marking.Covered mark with thin film transparent dressing to preserve mark until date of surgery.   WOC Nurse team will follow up with patient after surgery for continued ostomy care and teaching.   Thank you,  Doyal Polite, MSN, RN, [facility] WOC Team [phone] (Available Mon-Fri 0700-1500)

## 2024-10-19 NOTE — Op Note (Addendum)
 Preoperative Diagnosis: Diverticulosis with history of recurrent diverticulitis   Postoperative Diagnosis: Same  Procedure(s) Performed: Laparoscopic assisted partial colectomy, flexible sigmoidoscopy   Surgeon: Orie Silversmith, MD    Assistants: Marcey Schultze, MD   Anesthesia: General.    Complications: None   Operative Findings: Diverticular disease in distal descending and sigmoid colon. Both ureters identified and preserved throughout case.  Firefly fluorescence seen at colorectal anastomosis. No evidence of leak when tested with flexible sigmoidoscopy and anastomosis intact without any active bleeding on direct visualization with sigmoidoscopy. Staple line at 20cm.   Indications for Surgery: Christina Potts is a 53 y.o. female with history of recurrent diverticulitis with prior admissions and most recent episode in September treated with PO antibiotics.   Procedure: The patient was identified in the holding area and transferred to the operating room and placed in lithotomy with arms tucked A timeout was performed and she underwent general anesthesia. Orogastric and urinary decompression tubes were placed.    Urology performed their portion of procedure first for firefly of bilateral ureters. Please see their operative dictation for this portion of the procedure.  The abdomen and perineum was then prepped and draped in the usual manner and fashion. Incision made at Palmer's point and veress needle inserted. Aspiration of bubbles and positive drop test. Abdomen then insufflated to CO2. Optiview with 5mm port in RUQ. No evidence of injury at optiview site or veress site. Veress then removed. Additional ports placed under direct visualization.   The patient was placed in Trendelenburg with right side slightly down.  The omentum was tucked over the liver and small bowel moved to the right upper quadrant. Prior to any dissection we utilized firefly fluorescence and identified both the  left and right ureter which were well out of our dissection.  This was checked multiple times throughout the case to ensure ureters were protected. We mobilized the sigmoid and descending colon laterally to medially using LigaSure to free the colon from its peritoneal attachments along the white line of Toldt on the proximal descending colon down to the pelvic inlet.  We were able to free the rectum from surrounding tissue while protecting the ureters.    Once we felt that the colon was adequately mobilized, we identified a point of healthy descending colon proximal to the diseased segment of distal descending and proximal sigmoid colon.  We turned our dissection to the mesentery.  The sigmoid colon mesentery was tented medially the peritoneum was opened.  We continued to open the peritoneum and were able to make a window in the mesentery medially to laterally connecting the lateral dissection plane.  Then using a LigaSure we carefully divided the mesentery.  Care was taken to divide the IMA and ensure hemostasis after division.  We divided the mesentery between the a healthy point of colon proximally and at the rectosigmoid junction distally using a Ligasure. We then marked out our tentative incision at the suprapubic region for Pfannenstiel and placed a 12mm port to the right of midline along this line to facilitate stapler placement. A green load of the laparoscopic automatic stapler was then placed and fired across the rectosigmoid junction.  We then turned our attention back to the suprapubic region and created a Pfannenstiel incision. A 10 blade was used at the skin to lengthen the 12mm port site and electrocautery for dissection down to anterior fascia which we divided transversely. We then bluntly separated the rectus muscle and then divided the peritoneum. We then placed  a wound protector.  We isolated the surgical field with green towels and then we then exteriorized the staple line at the descending  colon and placed a pursestring suture clamp proximal to the staple line. A 2-0 Prolene was placed through the keyhole of the pursestring device.  The staple line distal to the suture was then sharply transected.  Placed reinforcing sutures x3 of the colotomy using 2-0 silk. We sized the colotomy and determined a size 29 EEA stapler needed. We then placed the EEA 29 anvil and the Prolene suture was tied down around the anvil.  We ensured that the anvil was secured. We then placed cap on wound protector and reinsufflated.  At this point we performed an EUA and was easily able to place dilators into rectum under direct visualization. We then introduced a green load of the EEA circular stapler size 29  into the anus and guided under direct vision along the rectal stump.  The spike was open immediately posterior to the midportion of the staple line.  The proximal bowel with anvil in place is engaged with the spike.  The correct mesenteric orientation of the proximal colon was verified prior to closing the stapler.  Stapler was then closed and fired under direct vision, avoiding trapping of the vaginal cuff and bladder. Stapler then removed. Flexible sigmoidoscopy then performed. Anastomosis visualized directly and was hemostatic and intact. Tested for leak by filling the pelvis with saline and performing air leak test by flexible sigmoidoscopy and found to be intact, no evidence of bubbles.  2 complete donuts were examined on the back table. Abdomen and pelvis irrigated.   We once more ensured staple line appeared intact with adequate blood supply--this was testing using firefly fluorescence and the bowel/rectum at the anastomosis fluoresced brightly green. Appeared to be tension free. The patient was flattened and omentum was tucked into the pelvis.  We then redraped and rescrubbed for clean field.   We then desufflated removing the Pfannenstiel wound protector port cap and all trocars.  The peritoneum was  closed in vertical fashion with 0 vicryl. The fascia of Pfannenstiel was closed using two #1 running PDS sutures. Subcutaneous tissue of Pfannenstiel closed approximated with 3-0 vicryl suture interrupted. Skin of Pfannenstiel incision as well as all trocar sites were closed using 4-0 Monocryl.  Dermabond was then placed.   Orogastric tube removed and the patient was awoken from anesthesia and transported to recovery in stable condition with foley catheter in place.  Patient's mother was called and updated and all questions answered.  Orie Silversmith, MD General Surgery, Surgical Critical Care and Trauma

## 2024-10-19 NOTE — Plan of Care (Signed)

## 2024-10-20 LAB — CBC
HCT: 36 % (ref 36.0–46.0)
HCT: 34.4 % — ABNORMAL LOW (ref 36.0–46.0)
Hemoglobin: 12.3 g/dL (ref 12.0–15.0)
Hemoglobin: 11.6 g/dL — ABNORMAL LOW (ref 12.0–15.0)
MCH: 30.3 pg (ref 26.0–34.0)
MCH: 30.2 pg (ref 26.0–34.0)
MCHC: 34.2 g/dL (ref 30.0–36.0)
MCHC: 33.7 g/dL (ref 30.0–36.0)
MCV: 88.7 fL (ref 80.0–100.0)
MCV: 89.6 fL (ref 80.0–100.0)
Platelets: 311 K/uL (ref 150–400)
Platelets: 292 K/uL (ref 150–400)
RBC: 4.06 MIL/uL (ref 3.87–5.11)
RBC: 3.84 MIL/uL — ABNORMAL LOW (ref 3.87–5.11)
RDW: 12.5 % (ref 11.5–15.5)
RDW: 12.6 % (ref 11.5–15.5)
WBC: 11.6 K/uL — ABNORMAL HIGH (ref 4.0–10.5)
WBC: 13.6 K/uL — ABNORMAL HIGH (ref 4.0–10.5)
nRBC: 0 % (ref 0.0–0.2)
nRBC: 0 % (ref 0.0–0.2)

## 2024-10-20 LAB — RENAL FUNCTION PANEL
Albumin: 3 g/dL — ABNORMAL LOW (ref 3.5–5.0)
Anion gap: 9 (ref 5–15)
BUN: 9 mg/dL (ref 6–20)
CO2: 25 mmol/L (ref 22–32)
Calcium: 7.8 mg/dL — ABNORMAL LOW (ref 8.9–10.3)
Chloride: 102 mmol/L (ref 98–111)
Creatinine, Ser: 1.1 mg/dL — ABNORMAL HIGH (ref 0.44–1.00)
GFR, Estimated: >60 mL/min (ref >60)
Glucose, Bld: 123 mg/dL — ABNORMAL HIGH (ref 70–99)
Phosphorus: 3 mg/dL (ref 2.5–4.6)
Potassium: 4 mmol/L (ref 3.5–5.1)
Sodium: 136 mmol/L (ref 135–145)

## 2024-10-20 LAB — BASIC METABOLIC PANEL WITH GFR
Anion gap: 11 (ref 5–15)
BUN: 10 mg/dL (ref 6–20)
CO2: 24 mmol/L (ref 22–32)
Calcium: 7.9 mg/dL — ABNORMAL LOW (ref 8.9–10.3)
Chloride: 100 mmol/L (ref 98–111)
Creatinine, Ser: 1.12 mg/dL — ABNORMAL HIGH (ref 0.44–1.00)
GFR, Estimated: 60 mL/min — ABNORMAL LOW (ref >60)
Glucose, Bld: 139 mg/dL — ABNORMAL HIGH (ref 70–99)
Potassium: 3.9 mmol/L (ref 3.5–5.1)
Sodium: 135 mmol/L (ref 135–145)

## 2024-10-20 LAB — MAGNESIUM: Magnesium: 1.7 mg/dL (ref 1.7–2.4)

## 2024-10-20 LAB — PHOSPHORUS: Phosphorus: 3.3 mg/dL (ref 2.5–4.6)

## 2024-10-20 MED ORDER — LIDOCAINE 5 % EX PTCH
3.0000 | MEDICATED_PATCH | CUTANEOUS | Status: DC
  Administered 2024-10-20 – 2024-10-26 (×7): 3 via TRANSDERMAL
  Filled 2024-10-20 (×7): qty 3

## 2024-10-20 MED ORDER — OXYCODONE HCL 5 MG PO TABS
5.0000 mg | ORAL_TABLET | ORAL | Status: DC | PRN
  Administered 2024-10-20: 10 mg via ORAL
  Administered 2024-10-20: 5 mg via ORAL
  Administered 2024-10-21: 10 mg via ORAL
  Administered 2024-10-21: 5 mg via ORAL
  Administered 2024-10-21 – 2024-10-22 (×3): 10 mg via ORAL
  Filled 2024-10-20 (×5): qty 2
  Filled 2024-10-20 (×2): qty 1

## 2024-10-20 MED ORDER — FENTANYL CITRATE (PF) 50 MCG/ML IJ SOSY
25.0000 ug | PREFILLED_SYRINGE | INTRAMUSCULAR | Status: DC | PRN
  Administered 2024-10-20 – 2024-10-24 (×5): 25 ug via INTRAVENOUS
  Filled 2024-10-20 (×6): qty 1

## 2024-10-20 MED ORDER — LACTATED RINGERS IV BOLUS
500.0000 mL | Freq: Once | INTRAVENOUS | Status: AC
  Administered 2024-10-20: 500 mL via INTRAVENOUS

## 2024-10-20 MED ORDER — METHOCARBAMOL 750 MG PO TABS
750.0000 mg | ORAL_TABLET | Freq: Four times a day (QID) | ORAL | Status: DC
  Administered 2024-10-20 – 2024-10-21 (×5): 750 mg via ORAL
  Filled 2024-10-20 (×5): qty 1

## 2024-10-20 MED ORDER — GABAPENTIN 300 MG PO CAPS
300.0000 mg | ORAL_CAPSULE | Freq: Three times a day (TID) | ORAL | Status: DC
  Administered 2024-10-20 – 2024-10-21 (×4): 300 mg via ORAL
  Filled 2024-10-20 (×4): qty 1

## 2024-10-20 NOTE — Evaluation (Signed)
 Occupational Therapy Evaluation Patient Details Name: Christina Potts MRN: [id number] DOB: 12/03/1970 Today's Date: 10/20/2024   History of Present Illness   Pt is a 53 y/o F who presented for laparascopic partial colectomy and flexible sigmoidoscopy on 10/19/24. PMHx: diverticulosis with recurrent diverticulitis, arthritis, anxiety, depression, IBS, HTN, GERD.     Clinical Impressions Pt seen today for OT evaluation, greeted in supine, recently medicated for pain. PTA, pt lived with her parents and was indep with ADLs, IADLs, driving, and working full time. Today, she presents with high pain levels, requiring max A for LB ADLs and CGA for UB ADLs. No more than CGA for transfers/ambulation in room with RW. Educated on postural awareness and activity pacing. Would benefit from adaptive equipment trial next visit. VSS, returned to supine due to fatigue.   Pt is currently functioning below baseline and would benefit from ongoing acute OT services to progress towards safe discharge and to facilitate return to prior level of function. Currently recommend d/c home with PRN assist at time of discharge. Do not anticipate any post-acute OT needs.     If plan is discharge home, recommend the following:   A little help with walking and/or transfers;A lot of help with bathing/dressing/bathroom;Assistance with cooking/housework;Assist for transportation     Functional Status Assessment   Patient has had a recent decline in their functional status and demonstrates the ability to make significant improvements in function in a reasonable and predictable amount of time.     Equipment Recommendations   Tub/shower seat     Recommendations for Other Services         Precautions/Restrictions   Precautions Precautions: None Precaution/Restrictions Comments: s/p parial colectomy Restrictions Weight Bearing Restrictions Per Provider Order: No     Mobility Bed Mobility Overal bed  mobility: Needs Assistance Bed Mobility: Rolling, Sidelying to Sit, Sit to Sidelying Rolling: Supervision, Used rails Sidelying to sit: Contact guard assist, Used rails     Sit to sidelying: Contact guard assist, Used rails General bed mobility comments: incr effort, guided through log roll technique, HOB slightly elevated for supine>sit, exited to the R side    Transfers Overall transfer level: Needs assistance Equipment used: Rolling walker (2 wheels) Transfers: Sit to/from Stand Sit to Stand: Contact guard assist           General transfer comment: Stood from bed height with cues for hand placement and powering up.      Balance Overall balance assessment: Mild deficits observed, not formally tested (anticipate 2/2 pain)                                         ADL either performed or assessed with clinical judgement   ADL Overall ADL's : Needs assistance/impaired Eating/Feeding: Set up   Grooming: Contact guard assist;Standing;Wash/dry hands;Oral care   Upper Body Bathing: Set up   Lower Body Bathing: Maximal assistance   Upper Body Dressing : Set up   Lower Body Dressing: Maximal assistance   Toilet Transfer: Contact guard assist;Ambulation;Regular Toilet;Rolling walker (2 wheels)   Toileting- Clothing Manipulation and Hygiene: Maximal assistance;Sit to/from stand Toileting - Clothing Manipulation Details (indicate cue type and reason): assist for thoroughness with reaching posterior peri area following attempted BM     Functional mobility during ADLs: Contact guard assist;Rolling walker (2 wheels)       Vision Baseline Vision/History: 1 Wears glasses Ability to  See in Adequate Light: 0 Adequate Patient Visual Report: No change from baseline Vision Assessment?: Wears glasses for reading     Perception         Praxis         Pertinent Vitals/Pain Pain Assessment Pain Assessment: 0-10 Pain Score: 6  Pain Location: abdomen Pain  Descriptors / Indicators: Discomfort, Grimacing, Guarding Pain Intervention(s): Limited activity within patient's tolerance, Monitored during session, Premedicated before session, Repositioned (given oxy prior to OT session)     Extremity/Trunk Assessment Upper Extremity Assessment Upper Extremity Assessment: Overall WFL for tasks assessed (did not formally assess MMT 2/2 abdominal sx)   Lower Extremity Assessment Lower Extremity Assessment: Overall WFL for tasks assessed   Cervical / Trunk Assessment Cervical / Trunk Assessment: Other exceptions Cervical / Trunk Exceptions: abdominal sx   Communication Communication Communication: No apparent difficulties   Cognition Arousal: Alert (intermittently drowsy (suspected 2/2 recent oxy)) Behavior During Therapy: Flat affect (but participatory) Cognition: No apparent impairments                               Following commands: Intact       Cueing  General Comments   Cueing Techniques: Verbal cues;Gestural cues      Exercises     Shoulder Instructions      Home Living Family/patient expects to be discharged to:: Private residence Living Arrangements: Parent Available Help at Discharge: Family;Available 24 hours/day Type of Home: House Home Access: Stairs to enter [employer] of Steps: 6 STE Entrance Stairs-Rails: Right;Left Home Layout: One level     Bathroom Shower/Tub: Chief Strategy Officer: Standard Bathroom Accessibility: Yes How Accessible: Accessible via walker Home Equipment: Hand held shower head          Prior Functioning/Environment Prior Level of Function : Independent/Modified Independent             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[employer], teaches phlebotmy ADLs Comments: indep    OT Problem List: Decreased activity tolerance;Obesity;Pain   OT Treatment/Interventions: Self-care/ADL training;Energy conservation;DME and/or AE  instruction;Therapeutic activities;Patient/family education      OT Goals(Current goals can be found in the care plan section)   Acute Rehab OT Goals Patient Stated Goal: get better, less pain OT Goal Formulation: With patient Time For Goal Achievement: 11/03/24 Potential to Achieve Goals: Good   OT Frequency:  Min 2X/week    Co-evaluation              AM-PAC OT 6 Clicks Daily Activity     Outcome Measure Help from another person eating meals?: None Help from another person taking care of personal grooming?: A Little Help from another person toileting, which includes using toliet, bedpan, or urinal?: A Lot Help from another person bathing (including washing, rinsing, drying)?: A Lot Help from another person to put on and taking off regular upper body clothing?: A Little Help from another person to put on and taking off regular lower body clothing?: A Lot 6 Click Score: 16   End of Session Equipment Utilized During Treatment: Rolling walker (2 wheels) Nurse Communication: Mobility status  Activity Tolerance: Patient tolerated treatment well Patient left: in bed;with call bell/phone within reach;with bed alarm set;with SCD's reapplied  OT Visit Diagnosis: Other abnormalities of gait and mobility (R26.89);Pain Pain - part of body:  (abdomen)                Time: 8597-8564 OT  Time Calculation (min): 33 min Charges:  OT General Charges $OT Visit: 1 Visit OT Evaluation $OT Eval Moderate Complexity: 1 Mod OT Treatments $Self Care/Home Management : 8-22 mins  Baillie Mohammad M. [place], OTR/L [facility] Acute Rehabilitation Services [phone] Secure Chat Preferred  Aurorah Schlachter 10/20/2024, 4:42 PM

## 2024-10-20 NOTE — Evaluation (Signed)
 Physical Therapy Evaluation Patient Details Name: Christina Potts MRN: [id number] DOB: [birth year] Today's Date: 10/20/2024  History of Present Illness  Pt is a 53 y/o F who presented for laparascopic partial colectomy and flexible sigmoidoscopy on 10/19/24. PMHx: diverticulosis with recurrent diverticulitis, arthritis, anxiety, depression, IBS, HTN, GERD.  Clinical Impression  PTA pt living with parents in single story home with 3 steps to enter and working full time, also teaching Phlebotomy. Pt is currently limited in safe mobility by surgical abdominal pain. Pt is contact guard for bed mobility and transfer to recliner. Pt agreeable to sit up to eat her breakfast but is in 10/10 pain and has asked for pain medication. RN in room with pain medication at end of session. PT currently recommending HHPT but pt may well progress to not needing by discharge. PT has referred pt to Mobility Specialist and will continue to follow acutely.         If plan is discharge home, recommend the following: A little help with bathing/dressing/bathroom;Assistance with cooking/housework;Assist for transportation;Help with stairs or ramp for entrance   Can travel by private vehicle    Yes    Equipment Recommendations Rolling walker (2 wheels);BSC/3in1 (if still limited by pain)     Functional Status Assessment Patient has had a recent decline in their functional status and demonstrates the ability to make significant improvements in function in a reasonable and predictable amount of time.     Precautions / Restrictions Precautions Precautions: None Precaution/Restrictions Comments: new colectomy, bilateral IV Restrictions Weight Bearing Restrictions Per Provider Order: No      Mobility  Bed Mobility Overal bed mobility: Needs Assistance Bed Mobility: Sidelying to Sit, Rolling Rolling: Supervision Sidelying to sit: Contact guard assist       General bed mobility comments: taught log rolling  for decreased abdominal torsion with movement, pt able to navigate LE off bed, contact guard for safety with pt slowly bringing her trunk to upright    Transfers Overall transfer level: Needs assistance Equipment used: Rolling walker (2 wheels) Transfers: Sit to/from Stand, Bed to chair/wheelchair/BSC Sit to Stand: Contact guard assist   Step pivot transfers: Contact guard assist       General transfer comment: increased time and effort for powerup, vc for hand placement on bed for powerup and for stepping to chair. contact guard for safety    Ambulation/Gait               General Gait Details: deferred due to increased pain      Balance Overall balance assessment: Mild deficits observed, not formally tested                                           Pertinent Vitals/Pain Pain Assessment Pain Assessment: 0-10 Pain Score: 10-Worst pain ever Pain Location: abdomen 6/10 at rest 10/10 with movement Pain Descriptors / Indicators: Operative site guarding Pain Intervention(s): Limited activity within patient's tolerance, Premedicated before session, Patient requesting pain meds-RN notified    Home Living Family/patient expects to be discharged to:: Private residence Living Arrangements: Parent Available Help at Discharge: Family;Available 24 hours/day Type of Home: House Home Access: Stairs to enter Entrance Stairs-Rails: Doctor, [facility] of Steps: 6   Home Layout: One level Home Equipment: Hand held shower head      Prior Function Prior Level of Function : Independent/Modified Independent  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[employer], teach phlebotmy ADLs Comments: independent     Extremity/Trunk Assessment   Upper Extremity Assessment Upper Extremity Assessment: Defer to OT evaluation    Lower Extremity Assessment Lower Extremity Assessment: Overall WFL for tasks assessed    Cervical / Trunk  Assessment Cervical / Trunk Assessment: Other exceptions Cervical / Trunk Exceptions: abdominal sx  Communication   Communication Communication: No apparent difficulties    Cognition Arousal: Alert Behavior During Therapy: Flat affect   PT - Cognitive impairments: No apparent impairments                         Following commands: Intact       Cueing Cueing Techniques: Verbal cues, Tactile cues     General Comments General comments (skin integrity, edema, etc.): VSS on RA        Assessment/Plan    PT Assessment Patient needs continued PT services  PT Problem List Decreased activity tolerance;Decreased mobility;Pain;Decreased skin integrity       PT Treatment Interventions DME instruction;Gait training;Therapeutic activities;Functional mobility training;Stair training;Therapeutic exercise;Balance training;Patient/family education    PT Goals (Current goals can be found in the Care Plan section)  Acute Rehab PT Goals Patient Stated Goal: less pain PT Goal Formulation: With patient Time For Goal Achievement: 11/03/24 Potential to Achieve Goals: Fair    Frequency Min 3X/week        AM-PAC PT 6 Clicks Mobility  Outcome Measure Help needed turning from your back to your side while in a flat bed without using bedrails?: None Help needed moving from lying on your back to sitting on the side of a flat bed without using bedrails?: A Little Help needed moving to and from a bed to a chair (including a wheelchair)?: A Little Help needed standing up from a chair using your arms (e.g., wheelchair or bedside chair)?: A Little Help needed to walk in hospital room?: A Little Help needed climbing 3-5 steps with a railing? : A Lot 6 Click Score: 18    End of Session   Activity Tolerance: Patient limited by pain Patient left: in chair;with call bell/phone within reach;with nursing/sitter in room Nurse Communication: Mobility status;Patient requests pain meds PT  Visit Diagnosis: Pain;Difficulty in walking, not elsewhere classified (R26.2) Pain - part of body:  (abdomen)    Time: 8995-8967 PT Time Calculation (min) (ACUTE ONLY): 28 min   Charges:   PT Evaluation $PT Eval Low Complexity: 1 Low PT Treatments $Therapeutic Activity: 8-22 mins PT General Charges $$ ACUTE PT VISIT: 1 Visit         Delsy Etzkorn B. Fleeta Lapidus PT, DPT Acute Rehabilitation Services Please use secure chat or  Call Office [phone]   Almarie KATHEE Fleeta Fleet 10/20/2024, 10:50 AM

## 2024-10-20 NOTE — Progress Notes (Addendum)
   Progress Note  1 Day Post-Op  Interval: POD 1, having muscle spasms and pain, some nausea without emesis overnight. Episode of bloody BM overnight   Objective: Vital signs in last 24 hours: Temp:  [97.5 F (36.4 C)-98.2 F (36.8 C)] 97.9 F (36.6 C) (11/22 0744) Pulse Rate:  [63-93] 71 (11/22 0744) Resp:  [12-19] 17 (11/22 0343) BP: (108-148)/(65-91) 129/77 (11/22 0744) SpO2:  [91 %-100 %] 95 % (11/22 0744) Weight:  [103.6 kg] 103.6 kg (11/22 0500) Last BM Date : 10/19/24  Intake/Output from previous day: 11/21 0701 - 11/22 0700 In: 2050 [P.O.:600; I.V.:1100; IV Piggyback:350] Out: 850 [Urine:610; Stool:140; Blood:100] Intake/Output this shift: Total I/O In: 240 [P.O.:240] Out: 350 [Urine:350]  PE: General: NAD Heart: regular, rate, and rhythm Lungs: Normal work of breathing on room air Abd: soft, tender appropriately, nondistended MS: all 4 extremities are symmetrical with no cyanosis, clubbing, or edema. Skin: warm and dry with no masses, lesions, or rashes Neuro: No focal neurologic deficits Psych: A&Ox3 with an appropriate affect.    Lab Results:  Recent Labs    10/20/24 0533 10/20/24 1146  WBC 11.6* 13.6*  HGB 12.3 11.6*  HCT 36.0 34.4*  PLT 311 292   BMET Recent Labs    10/20/24 0533  NA 135  K 3.9  CL 100  CO2 24  GLUCOSE 139*  BUN 10  CREATININE 1.12*  CALCIUM  7.9*   PT/INR No results for input(s): LABPROT, INR in the last 72 hours. CMP     Component Value Date/Time   NA 135 10/20/2024 0533   NA 142 07/26/2018 1420   K 3.9 10/20/2024 0533   CL 100 10/20/2024 0533   CO2 24 10/20/2024 0533   GLUCOSE 139 (H) 10/20/2024 0533   BUN 10 10/20/2024 0533   BUN 6 07/26/2018 1420   CREATININE 1.12 (H) 10/20/2024 0533   CREATININE 0.92 08/23/2024 1633   CALCIUM  7.9 (L) 10/20/2024 0533   PROT 6.6 10/15/2024 1344   PROT 6.4 07/26/2018 1420   ALBUMIN  3.3 (L) 10/15/2024 1344   ALBUMIN  4.0 07/26/2018 1420   AST 27 10/15/2024 1344    ALT 25 10/15/2024 1344   ALKPHOS 77 10/15/2024 1344   BILITOT 0.9 10/15/2024 1344   BILITOT 0.2 07/26/2018 1420   GFRNONAA 60 (L) 10/20/2024 0533   GFRAA 94 07/26/2018 1420   Lipase     Component Value Date/Time   LIPASE 13 08/23/2024 1633       Studies/Results: No results found.   Assessment/Plan 53 y.o. female s/p partial colectomy for diverticulitis with primary anastomosis on 10/19/24  LOS: 1 day   - Continue CLD - Continue IVF given borderline UOP overnight - Increase pain medications: robaxin  750 QID, gaba 300 TID, oxy 5-10 q4h PRN, fentanyl  25q2h PRN, tylenol  sch - Continue foley - Hold lovenox , repeat CBC this PM, repeat electrolytes this PM  I reviewed nursing notes, last 24 h vitals and pain scores, last 48 h intake and output, last 24 h labs and trends, and last 24 h imaging results.    Orie Silversmith, MD General Surgery, Surgical Critical Care and Trauma 10/20/2024, 12:43 PM

## 2024-10-20 NOTE — Anesthesia Postprocedure Evaluation (Signed)
 Anesthesia Post Note  Patient: Christina Potts  Procedure(s) Performed: LAPAROSCOPIC PARTIAL COLECTOMY SIGMOIDOSCOPY, FLEXIBLE (Rectum) EXPLORATION, ABDOMEN (Abdomen) CYSTOSCOPY WITH INDOCYANINE GREEN  IMAGING (ICG)     Patient location during evaluation: PACU Anesthesia Type: General Level of consciousness: awake and alert Pain management: pain level controlled Vital Signs Assessment: post-procedure vital signs reviewed and stable Respiratory status: spontaneous breathing, nonlabored ventilation, respiratory function stable and patient connected to nasal cannula oxygen Cardiovascular status: blood pressure returned to baseline and stable Postop Assessment: no apparent nausea or vomiting Anesthetic complications: no   No notable events documented.  Last Vitals:  Vitals:   10/20/24 0744 10/20/24 1320  BP: 129/77 136/77  Pulse: 71 74  Resp:    Temp: 36.6 C 36.5 C  SpO2: 95% 95%    Last Pain:  Vitals:   10/20/24 1400  TempSrc:   PainSc: 6                  Garnette DELENA Gab

## 2024-10-21 ENCOUNTER — Encounter (HOSPITAL_COMMUNITY): Payer: Self-pay | Admitting: General Surgery

## 2024-10-21 LAB — CBC
HCT: 30 % — ABNORMAL LOW (ref 36.0–46.0)
HCT: 31.8 % — ABNORMAL LOW (ref 36.0–46.0)
Hemoglobin: 10 g/dL — ABNORMAL LOW (ref 12.0–15.0)
Hemoglobin: 10.6 g/dL — ABNORMAL LOW (ref 12.0–15.0)
MCH: 30.1 pg (ref 26.0–34.0)
MCH: 30.3 pg (ref 26.0–34.0)
MCHC: 33.3 g/dL (ref 30.0–36.0)
MCHC: 33.3 g/dL (ref 30.0–36.0)
MCV: 90.4 fL (ref 80.0–100.0)
MCV: 90.9 fL (ref 80.0–100.0)
Platelets: 208 K/uL (ref 150–400)
Platelets: 237 K/uL (ref 150–400)
RBC: 3.32 MIL/uL — ABNORMAL LOW (ref 3.87–5.11)
RBC: 3.5 MIL/uL — ABNORMAL LOW (ref 3.87–5.11)
RDW: 12.8 % (ref 11.5–15.5)
RDW: 12.8 % (ref 11.5–15.5)
WBC: 8.7 K/uL (ref 4.0–10.5)
WBC: 9.9 K/uL (ref 4.0–10.5)
nRBC: 0 % (ref 0.0–0.2)
nRBC: 0 % (ref 0.0–0.2)

## 2024-10-21 LAB — BASIC METABOLIC PANEL WITH GFR
Anion gap: 7 (ref 5–15)
BUN: 6 mg/dL (ref 6–20)
CO2: 25 mmol/L (ref 22–32)
Calcium: 7.6 mg/dL — ABNORMAL LOW (ref 8.9–10.3)
Chloride: 105 mmol/L (ref 98–111)
Creatinine, Ser: 0.9 mg/dL (ref 0.44–1.00)
GFR, Estimated: >60 mL/min (ref >60)
Glucose, Bld: 89 mg/dL (ref 70–99)
Potassium: 3.4 mmol/L — ABNORMAL LOW (ref 3.5–5.1)
Sodium: 137 mmol/L (ref 135–145)

## 2024-10-21 MED ORDER — METHOCARBAMOL 500 MG PO TABS
1000.0000 mg | ORAL_TABLET | Freq: Four times a day (QID) | ORAL | Status: DC
  Administered 2024-10-21 – 2024-10-22 (×4): 1000 mg via ORAL
  Filled 2024-10-21 (×4): qty 2

## 2024-10-21 MED ORDER — MAGIC MOUTHWASH W/LIDOCAINE
15.0000 mL | Freq: Four times a day (QID) | ORAL | Status: DC | PRN
  Administered 2024-10-22 – 2024-10-23 (×3): 15 mL via ORAL
  Filled 2024-10-21 (×6): qty 15

## 2024-10-21 MED ORDER — GABAPENTIN 400 MG PO CAPS
400.0000 mg | ORAL_CAPSULE | Freq: Three times a day (TID) | ORAL | Status: DC
  Administered 2024-10-21 – 2024-10-23 (×8): 400 mg via ORAL
  Filled 2024-10-21 (×8): qty 1

## 2024-10-21 MED ORDER — POTASSIUM CHLORIDE CRYS ER 20 MEQ PO TBCR
40.0000 meq | EXTENDED_RELEASE_TABLET | Freq: Once | ORAL | Status: AC
  Administered 2024-10-21: 40 meq via ORAL
  Filled 2024-10-21: qty 2

## 2024-10-21 MED ORDER — PROCHLORPERAZINE MALEATE 10 MG PO TABS
10.0000 mg | ORAL_TABLET | Freq: Four times a day (QID) | ORAL | Status: DC | PRN
  Administered 2024-10-21 – 2024-10-25 (×3): 10 mg via ORAL
  Filled 2024-10-21 (×5): qty 1

## 2024-10-21 NOTE — Progress Notes (Signed)
   Progress Note  2 Days Post-Op  Interval: Still having pain control issues but slightly improved today. Another episode of blood/clots per rectum. Slowly downtrending H/H, likely combination of oozing from anastomosis and dilution. Leukocytosis resolved. No tachycardia or fevers. Tolerated clears. Some intermittent nausea, typically after meds but not after PO intake.   Objective: Vital signs in last 24 hours: Temp:  [97.4 F (36.3 C)-97.9 F (36.6 C)] 97.9 F (36.6 C) (11/23 0912) Pulse Rate:  [72-81] 72 (11/23 1155) Resp:  [14-17] 14 (11/23 1155) BP: (102-163)/(51-75) 102/51 (11/23 1155) SpO2:  [93 %-100 %] 98 % (11/23 1155) Weight:  [103.1 kg] 103.1 kg (11/23 0500) Last BM Date : 10/20/24  Intake/Output from previous day: 11/22 0701 - 11/23 0700 In: 2260.7 [P.O.:410; I.V.:1850.7] Out: 1975 [Urine:1800; Stool:175] Intake/Output this shift: No intake/output data recorded.  PE: General: NAD Heart: regular, rate, and rhythm Lungs: Normal work of breathing on room air Abd: soft, tender appropriately, nondistended MS: all 4 extremities are symmetrical with no cyanosis, clubbing, or edema. Skin: warm and dry with no masses, lesions, or rashes Neuro: No focal neurologic deficits Psych: A&Ox3 with an appropriate affect.    Lab Results:  Recent Labs    10/20/24 1146 10/21/24 0513  WBC 13.6* 8.7  HGB 11.6* 10.0*  HCT 34.4* 30.0*  PLT 292 208   BMET Recent Labs    10/20/24 1146 10/21/24 0513  NA 136 137  K 4.0 3.4*  CL 102 105  CO2 25 25  GLUCOSE 123* 89  BUN 9 6  CREATININE 1.10* 0.90  CALCIUM  7.8* 7.6*   PT/INR No results for input(s): LABPROT, INR in the last 72 hours. CMP     Component Value Date/Time   NA 137 10/21/2024 0513   NA 142 07/26/2018 1420   K 3.4 (L) 10/21/2024 0513   CL 105 10/21/2024 0513   CO2 25 10/21/2024 0513   GLUCOSE 89 10/21/2024 0513   BUN 6 10/21/2024 0513   BUN 6 07/26/2018 1420   CREATININE 0.90 10/21/2024 0513    CREATININE 0.92 08/23/2024 1633   CALCIUM  7.6 (L) 10/21/2024 0513   PROT 6.6 10/15/2024 1344   PROT 6.4 07/26/2018 1420   ALBUMIN  3.0 (L) 10/20/2024 1146   ALBUMIN  4.0 07/26/2018 1420   AST 27 10/15/2024 1344   ALT 25 10/15/2024 1344   ALKPHOS 77 10/15/2024 1344   BILITOT 0.9 10/15/2024 1344   BILITOT 0.2 07/26/2018 1420   GFRNONAA >60 10/21/2024 0513   GFRAA 94 07/26/2018 1420   Lipase     Component Value Date/Time   LIPASE 13 08/23/2024 1633       Studies/Results: No results found.   Assessment/Plan 53 y.o. female s/p partial colectomy for diverticulitis with primary anastomosis on 10/19/24  LOS: 2 days   - Advance to low fiber diet - Discontinue IVF - Increase pain medications: robaxin  1000 QID, gaba 400 TID, oxy 5-10 q4h PRN, fentanyl  25q2h PRN, tylenol  sch, lidoderm  patchs - Discontinue foley - Hold lovenox , repeat CBC this PM  I reviewed nursing notes, last 24 h vitals and pain scores, last 48 h intake and output, last 24 h labs and trends, and last 24 h imaging results.    Orie Silversmith, MD General Surgery, Surgical Critical Care and Trauma 10/21/2024, 2:37 PM

## 2024-10-21 NOTE — Plan of Care (Signed)

## 2024-10-22 LAB — CBC
HCT: 29 % — ABNORMAL LOW (ref 36.0–46.0)
Hemoglobin: 9.6 g/dL — ABNORMAL LOW (ref 12.0–15.0)
MCH: 30.3 pg (ref 26.0–34.0)
MCHC: 33.1 g/dL (ref 30.0–36.0)
MCV: 91.5 fL (ref 80.0–100.0)
Platelets: 207 K/uL (ref 150–400)
RBC: 3.17 MIL/uL — ABNORMAL LOW (ref 3.87–5.11)
RDW: 13 % (ref 11.5–15.5)
WBC: 7.1 K/uL (ref 4.0–10.5)
nRBC: 0 % (ref 0.0–0.2)

## 2024-10-22 LAB — BASIC METABOLIC PANEL WITH GFR
Anion gap: 4 — ABNORMAL LOW (ref 5–15)
BUN: 8 mg/dL (ref 6–20)
CO2: 31 mmol/L (ref 22–32)
Calcium: 7.7 mg/dL — ABNORMAL LOW (ref 8.9–10.3)
Chloride: 104 mmol/L (ref 98–111)
Creatinine, Ser: 0.87 mg/dL (ref 0.44–1.00)
GFR, Estimated: >60 mL/min (ref >60)
Glucose, Bld: 96 mg/dL (ref 70–99)
Potassium: 3.4 mmol/L — ABNORMAL LOW (ref 3.5–5.1)
Sodium: 139 mmol/L (ref 135–145)

## 2024-10-22 LAB — PROTIME-INR
INR: 1.1 (ref 0.8–1.2)
Prothrombin Time: 14.9 s (ref 11.4–15.2)

## 2024-10-22 LAB — PREPARE RBC (CROSSMATCH)

## 2024-10-22 MED ORDER — CALCIUM GLUCONATE-NACL 2-0.675 GM/100ML-% IV SOLN
2.0000 g | Freq: Once | INTRAVENOUS | Status: AC
  Administered 2024-10-22: 2000 mg via INTRAVENOUS
  Filled 2024-10-22: qty 100

## 2024-10-22 MED ORDER — METHOCARBAMOL 750 MG PO TABS
750.0000 mg | ORAL_TABLET | Freq: Four times a day (QID) | ORAL | Status: DC
  Administered 2024-10-22 – 2024-10-23 (×7): 750 mg via ORAL
  Filled 2024-10-22 (×7): qty 1

## 2024-10-22 MED ORDER — SODIUM CHLORIDE 0.9% IV SOLUTION: Freq: Once | INTRAVENOUS | Status: AC

## 2024-10-22 MED ORDER — OXYCODONE HCL 5 MG PO TABS
10.0000 mg | ORAL_TABLET | ORAL | Status: DC | PRN
  Administered 2024-10-22 – 2024-10-23 (×6): 10 mg via ORAL
  Filled 2024-10-22 (×6): qty 2

## 2024-10-22 MED ORDER — POTASSIUM CHLORIDE CRYS ER 20 MEQ PO TBCR
40.0000 meq | EXTENDED_RELEASE_TABLET | Freq: Two times a day (BID) | ORAL | Status: AC
  Administered 2024-10-22 (×2): 40 meq via ORAL
  Filled 2024-10-22 (×2): qty 2

## 2024-10-22 NOTE — Plan of Care (Signed)

## 2024-10-22 NOTE — Progress Notes (Signed)
 Progress Note  3 Days Post-Op  Interval: Still some blood per rectum but decreasing in amount. Some dizziness, suspect due to overall decreased H/H from her baseline. Still some nausea but improved. Tolerating PO. Small amount of stool yesterday   Objective: Vital signs in last 24 hours: Temp:  [97.8 F (36.6 C)-99 F (37.2 C)] 98.1 F (36.7 C) (11/24 0501) Pulse Rate:  [72-84] 81 (11/24 0853) Resp:  [14-18] 18 (11/24 0853) BP: (102-133)/(51-81) 133/81 (11/24 0853) SpO2:  [95 %-100 %] 95 % (11/24 0853) Weight:  [99.5 kg] 99.5 kg (11/24 0500) Last BM Date : 10/21/24  Intake/Output from previous day: 11/23 0701 - 11/24 0700 In: 600 [P.O.:600] Out: 400 [Urine:400] Intake/Output this shift: No intake/output data recorded.  PE: General: NAD Heart: regular, rate, and rhythm Lungs: Normal work of breathing on room air Abd: soft, tender appropriately, nondistended MS: all 4 extremities are symmetrical with no cyanosis, clubbing, or edema. Skin: warm and dry with no masses, lesions, or rashes Neuro: No focal neurologic deficits Psych: A&Ox3 with an appropriate affect.    Lab Results:  Recent Labs    10/21/24 1442 10/22/24 0532  WBC 9.9 7.1  HGB 10.6* 9.6*  HCT 31.8* 29.0*  PLT 237 207   BMET Recent Labs    10/21/24 0513 10/22/24 0532  NA 137 139  K 3.4* 3.4*  CL 105 104  CO2 25 31  GLUCOSE 89 96  BUN 6 8  CREATININE 0.90 0.87  CALCIUM  7.6* 7.7*   PT/INR Recent Labs    10/22/24 0532  LABPROT 14.9  INR 1.1   CMP     Component Value Date/Time   NA 139 10/22/2024 0532   NA 142 07/26/2018 1420   K 3.4 (L) 10/22/2024 0532   CL 104 10/22/2024 0532   CO2 31 10/22/2024 0532   GLUCOSE 96 10/22/2024 0532   BUN 8 10/22/2024 0532   BUN 6 07/26/2018 1420   CREATININE 0.87 10/22/2024 0532   CREATININE 0.92 08/23/2024 1633   CALCIUM  7.7 (L) 10/22/2024 0532   PROT 6.6 10/15/2024 1344   PROT 6.4 07/26/2018 1420   ALBUMIN  3.0 (L) 10/20/2024 1146   ALBUMIN   4.0 07/26/2018 1420   AST 27 10/15/2024 1344   ALT 25 10/15/2024 1344   ALKPHOS 77 10/15/2024 1344   BILITOT 0.9 10/15/2024 1344   BILITOT 0.2 07/26/2018 1420   GFRNONAA >60 10/22/2024 0532   GFRAA 94 07/26/2018 1420   Lipase     Component Value Date/Time   LIPASE 13 08/23/2024 1633       Studies/Results: No results found.   Assessment/Plan 53 y.o. female s/p partial colectomy for diverticulitis with primary anastomosis on 10/19/24  LOS: 3 days   - Continue low fiber diet - Pain medications: robaxin  750 QID, gaba 400 TID, oxy 10-15 q4h PRN, fentanyl  25q2h PRN, tylenol  sch, lidoderm  patchs - Continue to hold lovenox  giving H/h continuing to drift down - Will transfuse 1u pRBC given patient fatigued and having dizziness/lightheadedness, suspect may be related to ~5 points below her baseline hemoglobin. - Given hemodynamic stability and decreasing amount of blood per rectum will hold off on any interrogation/procedures at this time. - 2g calcium  ordered to give with blood  I reviewed nursing notes, last 24 h vitals and pain scores, last 48 h intake and output, last 24 h labs and trends, and last 24 h imaging results.    Orie Silversmith, MD General Surgery, Surgical Critical Care and Trauma 10/22/2024, 9:44 AM

## 2024-10-22 NOTE — Progress Notes (Signed)
 Physical Therapy Treatment Patient Details Name: Christina Potts MRN: [id number] DOB: July 09, 1971 Today's Date: 10/22/2024   History of Present Illness Pt is a 53 y/o F who presented for laparascopic partial colectomy and flexible sigmoidoscopy on 10/19/24. REceived 1 unit PRBC 11/24. PMHx: diverticulosis with recurrent diverticulitis, arthritis, anxiety, depression, IBS, HTN, GERD.    PT Comments  Pt making good progress today.  She has been mobilizing some in her room on her own.  Ambulated 120' with RW and supervision.  Pt with slowed but steady transfers, does use RW for support/pain control .  Pt lives with her elderly parents who can assist with meals/etc but not provide physical assist.  Will continue to follow acutely but updated to no PT needs at d/c as pt expected to be able to progress on her own.  Did discuss potential for future outpt PT for core strengthening post op if needed.     If plan is discharge home, recommend the following: A little help with bathing/dressing/bathroom;Assistance with cooking/housework;Assist for transportation;Help with stairs or ramp for entrance;A little help with walking and/or transfers   Can travel by private vehicle        Equipment Recommendations  Rolling walker (2 wheels)    Recommendations for Other Services       Precautions / Restrictions Precautions Precautions: None     Mobility  Bed Mobility Overal bed mobility: Needs Assistance Bed Mobility: Sidelying to Sit, Sit to Sidelying Rolling: Supervision, Used rails Sidelying to sit: Supervision, Used rails, HOB elevated     Sit to sidelying: Supervision, Used rails, HOB elevated General bed mobility comments: supervision for safety with good demonstration of log rolling technique but with HOB elevated.  Did educate on positions of comfort for sleep/rest after abdominal sx    Transfers Overall transfer level: Needs assistance Equipment used: Rolling walker (2  wheels) Transfers: Sit to/from Stand Sit to Stand: Supervision           General transfer comment: Pt reports has been up/down to bathroom on her own    Ambulation/Gait Ambulation/Gait assistance: Supervision Gait Distance (Feet): 120 Feet Assistive device: Rolling walker (2 wheels) Gait Pattern/deviations: Step-through pattern, Decreased stride length       General Gait Details: 1 standing rest break due to pain, slowed but steady gait - still functional speed   Stairs             Wheelchair Mobility     Tilt Bed    Modified Rankin (Stroke Patients Only)       Balance Overall balance assessment: Needs assistance Sitting-balance support: No upper extremity supported Sitting balance-Leahy Scale: Good     Standing balance support: No upper extremity supported Standing balance-Leahy Scale: Good Standing balance comment: Ambulated wtih RW for comfort but able to stand for ADLs without support                            Communication    Cognition Arousal: Alert Behavior During Therapy: WFL for tasks assessed/performed   PT - Cognitive impairments: No apparent impairments                                Cueing    Exercises      General Comments General comments (skin integrity, edema, etc.): VSS ( BP 160/74 sitting).  Pt reports some improvement in dizziness after receiving PRBC today  Pertinent Vitals/Pain Pain Assessment Pain Assessment: 0-10 Pain Score: 4  Pain Location: abdomen Pain Descriptors / Indicators: Burning, Discomfort Pain Intervention(s): Limited activity within patient's tolerance, Monitored during session, Premedicated before session    Home Living                          Prior Function            PT Goals (current goals can now be found in the care plan section) Progress towards PT goals: Progressing toward goals    Frequency    Min 1X/week      PT Plan      Co-evaluation               AM-PAC PT 6 Clicks Mobility   Outcome Measure  Help needed turning from your back to your side while in a flat bed without using bedrails?: None Help needed moving from lying on your back to sitting on the side of a flat bed without using bedrails?: None Help needed moving to and from a bed to a chair (including a wheelchair)?: None Help needed standing up from a chair using your arms (e.g., wheelchair or bedside chair)?: None Help needed to walk in hospital room?: A Little Help needed climbing 3-5 steps with a railing? : A Little 6 Click Score: 22    End of Session Equipment Utilized During Treatment: Gait belt Activity Tolerance: Patient tolerated treatment well Patient left: with call bell/phone within reach;in bed Nurse Communication: Mobility status PT Visit Diagnosis: Pain;Difficulty in walking, not elsewhere classified (R26.2)     Time: 8386-8366 PT Time Calculation (min) (ACUTE ONLY): 20 min  Charges:    $Gait Training: 8-22 mins PT General Charges $$ ACUTE PT VISIT: 1 Visit                     Christina, PT Acute Rehab Services [facility] [phone]    Christina Potts 10/22/2024, 4:44 PM

## 2024-10-22 NOTE — Progress Notes (Signed)
 Occupational Therapy Treatment Patient Details Name: Christina Potts MRN: [id number] DOB: 1971/05/18 Today's Date: 10/22/2024   History of present illness Pt is a 53 y/o F who presented for laparascopic partial colectomy and flexible sigmoidoscopy on 10/19/24. PMHx: diverticulosis with recurrent diverticulitis, arthritis, anxiety, depression, IBS, HTN, GERD.   OT comments  Patient received in supine and declining OOB due to recently returned to supine from sitting on EOB but agreeable to return to EOB for AE training for LB dressing. Patient was provided education and demonstration on reacher and sock aide use for doffing socks and donning pants and with sock aide use. Patient was able to return demonstration with min assist and demonstrated good understanding.  Patient returned to supine at end of session with supervision for sit to side lying.  Discharge recommendations continue to be appropriate.  Acute OT to continue to follow to address established goals.       If plan is discharge home, recommend the following:  A little help with walking and/or transfers;A lot of help with bathing/dressing/bathroom;Assistance with cooking/housework;Assist for transportation   Equipment Recommendations  Tub/shower seat    Recommendations for Other Services      Precautions / Restrictions Precautions Precautions: None Precaution/Restrictions Comments: s/p parial colectomy Restrictions Weight Bearing Restrictions Per Provider Order: No       Mobility Bed Mobility Overal bed mobility: Needs Assistance Bed Mobility: Rolling, Sidelying to Sit, Sit to Sidelying Rolling: Supervision, Used rails Sidelying to sit: Supervision, Used rails     Sit to sidelying: Supervision, Used rails General bed mobility comments: supervision for safety with good demonstration of log rolling technique    Transfers Overall transfer level: Needs assistance                 General transfer comment:  declined OOB     Balance Overall balance assessment: Mild deficits observed, not formally tested                                         ADL either performed or assessed with clinical judgement   ADL Overall ADL's : Needs assistance/impaired     Grooming: Set up;Sitting Grooming Details (indicate cue type and reason): on EOB q             Lower Body Dressing: With adaptive equipment;Sit to/from stand;Minimal assistance Lower Body Dressing Details (indicate cue type and reason): education on AE use with reacher, socks aide and dressing stick with min assist to perform seated               General ADL Comments: demonstated good understanding of AE use    Extremity/Trunk Assessment              Vision       Perception     Praxis     Communication Communication Communication: No apparent difficulties   Cognition Arousal: Alert Behavior During Therapy: Flat affect Cognition: No apparent impairments                               Following commands: Intact        Cueing   Cueing Techniques: Verbal cues, Gestural cues  Exercises      Shoulder Instructions       General Comments patient declined OOB due to recently returned to supine from sitting  EOB    Pertinent Vitals/ Pain       Pain Assessment Pain Assessment: Faces Faces Pain Scale: Hurts little more Pain Location: abdomen Pain Descriptors / Indicators: Discomfort, Grimacing, Guarding Pain Intervention(s): Limited activity within patient's tolerance, Monitored during session, Repositioned  Home Living                                          Prior Functioning/Environment              Frequency  Min 2X/week        Progress Toward Goals  OT Goals(current goals can now be found in the care plan section)  Progress towards OT goals: Progressing toward goals  Acute Rehab OT Goals Patient Stated Goal: to feel better OT Goal  Formulation: With patient Time For Goal Achievement: 11/03/24 Potential to Achieve Goals: Good ADL Goals Pt Will Perform Grooming: with modified independence;standing Pt Will Perform Lower Body Bathing: with modified independence;with adaptive equipment;sitting/lateral leans;sit to/from stand Pt Will Perform Lower Body Dressing: with modified independence;with adaptive equipment;sitting/lateral leans;sit to/from stand Pt Will Transfer to Toilet: with modified independence;ambulating;regular height toilet;grab bars Pt Will Perform Toileting - Clothing Manipulation and hygiene: with modified independence;sitting/lateral leans;sit to/from stand Additional ADL Goal #1: Pt will recall and maintain 3 energy conservation/work simplification strategies during ADLs and functional mobility.  Plan      Co-evaluation                 AM-PAC OT 6 Clicks Daily Activity     Outcome Measure   Help from another person eating meals?: None Help from another person taking care of personal grooming?: A Little Help from another person toileting, which includes using toliet, bedpan, or urinal?: A Lot Help from another person bathing (including washing, rinsing, drying)?: A Lot Help from another person to put on and taking off regular upper body clothing?: A Little Help from another person to put on and taking off regular lower body clothing?: A Lot 6 Click Score: 16    End of Session Equipment Utilized During Treatment: Other (comment) (reacher, sock aide, and dressing stick)  OT Visit Diagnosis: Other abnormalities of gait and mobility (R26.89);Pain Pain - part of body:  (abdomen)   Activity Tolerance Patient tolerated treatment well   Patient Left in bed;with call bell/phone within reach   Nurse Communication Mobility status        Time: 8951-8891 OT Time Calculation (min): 20 min  Charges: OT General Charges $OT Visit: 1 Visit OT Treatments $Self Care/Home Management : 8-22  mins  Dick Laine, OTA Acute Rehabilitation Services  Office [phone]   Jeb LITTIE Laine 10/22/2024, 12:15 PM

## 2024-10-23 ENCOUNTER — Inpatient Hospital Stay (HOSPITAL_COMMUNITY)

## 2024-10-23 LAB — BASIC METABOLIC PANEL WITH GFR
Anion gap: 9 (ref 5–15)
BUN: 6 mg/dL (ref 6–20)
CO2: 24 mmol/L (ref 22–32)
Calcium: 8.2 mg/dL — ABNORMAL LOW (ref 8.9–10.3)
Chloride: 105 mmol/L (ref 98–111)
Creatinine, Ser: 0.86 mg/dL (ref 0.44–1.00)
GFR, Estimated: >60 mL/min (ref >60)
Glucose, Bld: 89 mg/dL (ref 70–99)
Potassium: 4.3 mmol/L (ref 3.5–5.1)
Sodium: 138 mmol/L (ref 135–145)

## 2024-10-23 LAB — TYPE AND SCREEN
ABO/RH(D): O POS
Antibody Screen: NEGATIVE
Unit division: 0

## 2024-10-23 LAB — PHOSPHORUS: Phosphorus: 3.9 mg/dL (ref 2.5–4.6)

## 2024-10-23 LAB — CBC
HCT: 36.3 % (ref 36.0–46.0)
Hemoglobin: 12.4 g/dL (ref 12.0–15.0)
MCH: 31.3 pg (ref 26.0–34.0)
MCHC: 34.2 g/dL (ref 30.0–36.0)
MCV: 91.7 fL (ref 80.0–100.0)
Platelets: 267 K/uL (ref 150–400)
RBC: 3.96 MIL/uL (ref 3.87–5.11)
RDW: 13.2 % (ref 11.5–15.5)
WBC: 10.3 K/uL (ref 4.0–10.5)
nRBC: 0 % (ref 0.0–0.2)

## 2024-10-23 LAB — BPAM RBC
Blood Product Expiration Date: 202512192359
ISSUE DATE / TIME: 202511241154
Unit Type and Rh: 5100

## 2024-10-23 LAB — SURGICAL PATHOLOGY

## 2024-10-23 LAB — MAGNESIUM: Magnesium: 1.6 mg/dL — ABNORMAL LOW (ref 1.7–2.4)

## 2024-10-23 MED ORDER — CALCIUM CARBONATE ANTACID 500 MG PO CHEW
1.0000 | CHEWABLE_TABLET | Freq: Four times a day (QID) | ORAL | Status: DC | PRN
  Administered 2024-10-23 – 2024-10-25 (×4): 200 mg via ORAL
  Filled 2024-10-23 (×4): qty 1

## 2024-10-23 MED ORDER — POLYETHYLENE GLYCOL 3350 17 G PO PACK
17.0000 g | PACK | Freq: Every day | ORAL | Status: DC
  Administered 2024-10-23 – 2024-10-29 (×6): 17 g via ORAL
  Filled 2024-10-23 (×7): qty 1

## 2024-10-23 MED ORDER — ENOXAPARIN SODIUM 40 MG/0.4ML IJ SOSY
40.0000 mg | PREFILLED_SYRINGE | INTRAMUSCULAR | Status: DC
  Administered 2024-10-23 – 2024-10-29 (×7): 40 mg via SUBCUTANEOUS
  Filled 2024-10-23 (×7): qty 0.4

## 2024-10-23 MED ORDER — DOCUSATE SODIUM 100 MG PO CAPS
100.0000 mg | ORAL_CAPSULE | Freq: Two times a day (BID) | ORAL | Status: DC
  Administered 2024-10-23 – 2024-10-29 (×11): 100 mg via ORAL
  Filled 2024-10-23 (×13): qty 1

## 2024-10-23 MED ORDER — OXYCODONE HCL 5 MG PO TABS
5.0000 mg | ORAL_TABLET | ORAL | Status: DC | PRN
  Administered 2024-10-23 – 2024-10-24 (×3): 5 mg via ORAL
  Administered 2024-10-24: 10 mg via ORAL
  Filled 2024-10-23: qty 2
  Filled 2024-10-23 (×3): qty 1

## 2024-10-23 MED ORDER — MAGNESIUM SULFATE 4 GM/100ML IV SOLN
4.0000 g | Freq: Once | INTRAVENOUS | Status: AC
  Administered 2024-10-23: 4 g via INTRAVENOUS
  Filled 2024-10-23: qty 100

## 2024-10-23 NOTE — Progress Notes (Signed)
   Progress Note  4 Days Post-Op  Interval: More than appropriate response to blood but patient still with some dizziness despite blood. Having some nausea now with PO intake. Feels more bloated. Minimal blood per rectum, mostly now just when wiping. Having bowel function but minimal stool.   Objective: Vital signs in last 24 hours: Temp:  [98.2 F (36.8 C)-98.8 F (37.1 C)] 98.8 F (37.1 C) (11/25 0730) Pulse Rate:  [79-96] 86 (11/25 0730) Resp:  [18-20] 18 (11/25 0730) BP: (114-156)/(57-85) 156/85 (11/25 0730) SpO2:  [96 %-100 %] 97 % (11/25 0730) Weight:  [101.3 kg] 101.3 kg (11/25 0500) Last BM Date : 10/21/24  Intake/Output from previous day: 11/24 0701 - 11/25 0700 In: 118 [P.O.:118] Out: -  Intake/Output this shift: No intake/output data recorded.  PE: General: NAD Heart: regular, rate, and rhythm Lungs: Normal work of breathing on room air Abd: soft, tender appropriately, mildly distended MS: all 4 extremities are symmetrical with no cyanosis, clubbing, or edema. Skin: warm and dry with no masses, lesions, or rashes Neuro: No focal neurologic deficits Psych: A&Ox3 with an appropriate affect.    Lab Results:  Recent Labs    10/22/24 0532 10/23/24 0448  WBC 7.1 10.3  HGB 9.6* 12.4  HCT 29.0* 36.3  PLT 207 267   BMET Recent Labs    10/22/24 0532 10/23/24 0448  NA 139 138  K 3.4* 4.3  CL 104 105  CO2 31 24  GLUCOSE 96 89  BUN 8 6  CREATININE 0.87 0.86  CALCIUM  7.7* 8.2*   PT/INR Recent Labs    10/22/24 0532  LABPROT 14.9  INR 1.1   CMP     Component Value Date/Time   NA 138 10/23/2024 0448   NA 142 07/26/2018 1420   K 4.3 10/23/2024 0448   CL 105 10/23/2024 0448   CO2 24 10/23/2024 0448   GLUCOSE 89 10/23/2024 0448   BUN 6 10/23/2024 0448   BUN 6 07/26/2018 1420   CREATININE 0.86 10/23/2024 0448   CREATININE 0.92 08/23/2024 1633   CALCIUM  8.2 (L) 10/23/2024 0448   PROT 6.6 10/15/2024 1344   PROT 6.4 07/26/2018 1420   ALBUMIN   3.0 (L) 10/20/2024 1146   ALBUMIN  4.0 07/26/2018 1420   AST 27 10/15/2024 1344   ALT 25 10/15/2024 1344   ALKPHOS 77 10/15/2024 1344   BILITOT 0.9 10/15/2024 1344   BILITOT 0.2 07/26/2018 1420   GFRNONAA >60 10/23/2024 0448   GFRAA 94 07/26/2018 1420   Lipase     Component Value Date/Time   LIPASE 13 08/23/2024 1633       Studies/Results: No results found.   Assessment/Plan 53 y.o. female s/p partial colectomy for diverticulitis with primary anastomosis on 10/19/24  LOS: 4 days   - Will order KUB to assess for ileus  - If ileus, will make NPO with sips and chips  - If nausea persists with NPO or has emesis will need NGT - Pain medications: robaxin  750 QID, gaba 400 TID, oxy 10-15 q4h PRN, fentanyl  25q2h PRN, tylenol  sch, lidoderm  patchs - Start lovenox  today for DVT ppx   I reviewed nursing notes, last 24 h vitals and pain scores, last 48 h intake and output, last 24 h labs and trends, and last 24 h imaging results.    Orie Silversmith, MD General Surgery, Surgical Critical Care and Trauma 10/23/2024, 9:15 AM

## 2024-10-23 NOTE — Consult Note (Signed)
 WOC Nurse ostomy follow up   WTA reviewed OP note, noted patient did not get ostomy.  No WOC needs at this time, WOC team will not follow.  Please re consult if new needs arise.  Thank you,  Doyal Polite, MSN, RN, [facility] WOC Team [phone] (Available Mon-Fri 0700-1500)

## 2024-10-23 NOTE — Plan of Care (Signed)

## 2024-10-24 ENCOUNTER — Inpatient Hospital Stay (HOSPITAL_COMMUNITY)

## 2024-10-24 LAB — BASIC METABOLIC PANEL WITH GFR
Anion gap: 9 (ref 5–15)
BUN: <5 mg/dL — ABNORMAL LOW (ref 6–20)
CO2: 27 mmol/L (ref 22–32)
Calcium: 8.1 mg/dL — ABNORMAL LOW (ref 8.9–10.3)
Chloride: 103 mmol/L (ref 98–111)
Creatinine, Ser: 0.93 mg/dL (ref 0.44–1.00)
GFR, Estimated: >60 mL/min (ref >60)
Glucose, Bld: 101 mg/dL — ABNORMAL HIGH (ref 70–99)
Potassium: 3.7 mmol/L (ref 3.5–5.1)
Sodium: 139 mmol/L (ref 135–145)

## 2024-10-24 LAB — CBC
HCT: 32.4 % — ABNORMAL LOW (ref 36.0–46.0)
HCT: 35.9 % — ABNORMAL LOW (ref 36.0–46.0)
Hemoglobin: 11 g/dL — ABNORMAL LOW (ref 12.0–15.0)
Hemoglobin: 12 g/dL (ref 12.0–15.0)
MCH: 31.1 pg (ref 26.0–34.0)
MCH: 30.7 pg (ref 26.0–34.0)
MCHC: 34 g/dL (ref 30.0–36.0)
MCHC: 33.4 g/dL (ref 30.0–36.0)
MCV: 91.5 fL (ref 80.0–100.0)
MCV: 91.8 fL (ref 80.0–100.0)
Platelets: 256 K/uL (ref 150–400)
Platelets: 274 K/uL (ref 150–400)
RBC: 3.54 MIL/uL — ABNORMAL LOW (ref 3.87–5.11)
RBC: 3.91 MIL/uL (ref 3.87–5.11)
RDW: 13.2 % (ref 11.5–15.5)
RDW: 13.3 % (ref 11.5–15.5)
WBC: 7.5 K/uL (ref 4.0–10.5)
WBC: 9.9 K/uL (ref 4.0–10.5)
nRBC: 0 % (ref 0.0–0.2)
nRBC: 0 % (ref 0.0–0.2)

## 2024-10-24 LAB — PHOSPHORUS: Phosphorus: 4.3 mg/dL (ref 2.5–4.6)

## 2024-10-24 LAB — MAGNESIUM: Magnesium: 2.3 mg/dL (ref 1.7–2.4)

## 2024-10-24 MED ORDER — GABAPENTIN 400 MG PO CAPS
400.0000 mg | ORAL_CAPSULE | Freq: Three times a day (TID) | ORAL | Status: DC
  Administered 2024-10-24 (×2): 400 mg via ORAL
  Filled 2024-10-24 (×2): qty 1

## 2024-10-24 MED ORDER — AMPHETAMINE-DEXTROAMPHETAMINE 10 MG PO TABS
15.0000 mg | ORAL_TABLET | Freq: Every day | ORAL | Status: DC
  Administered 2024-10-24 – 2024-10-29 (×5): 15 mg via ORAL
  Filled 2024-10-24 (×6): qty 2

## 2024-10-24 MED ORDER — IOHEXOL 350 MG/ML SOLN
75.0000 mL | Freq: Once | INTRAVENOUS | Status: AC | PRN
  Administered 2024-10-24: 75 mL via INTRAVENOUS

## 2024-10-24 MED ORDER — OXYCODONE HCL 5 MG PO TABS
5.0000 mg | ORAL_TABLET | ORAL | Status: DC | PRN
  Administered 2024-10-24 – 2024-10-29 (×6): 5 mg via ORAL
  Filled 2024-10-24 (×4): qty 1

## 2024-10-24 MED ORDER — IOHEXOL 300 MG/ML  SOLN
50.0000 mL | Freq: Once | INTRAMUSCULAR | Status: AC | PRN
  Administered 2024-10-24: 50 mL

## 2024-10-24 MED ORDER — GABAPENTIN 300 MG PO CAPS
300.0000 mg | ORAL_CAPSULE | Freq: Three times a day (TID) | ORAL | Status: DC
  Administered 2024-10-25 – 2024-10-29 (×11): 300 mg via ORAL
  Filled 2024-10-24 (×12): qty 1

## 2024-10-24 MED ORDER — METHOCARBAMOL 750 MG PO TABS
750.0000 mg | ORAL_TABLET | Freq: Three times a day (TID) | ORAL | Status: DC
  Administered 2024-10-24 – 2024-10-29 (×14): 750 mg via ORAL
  Filled 2024-10-24 (×15): qty 1

## 2024-10-24 MED ORDER — OXYCODONE HCL 5 MG PO TABS
5.0000 mg | ORAL_TABLET | ORAL | Status: DC | PRN
  Administered 2024-10-24 – 2024-10-28 (×12): 5 mg via ORAL
  Filled 2024-10-24 (×16): qty 1

## 2024-10-24 MED ORDER — NYSTATIN 100000 UNIT/ML MT SUSP
5.0000 mL | Freq: Four times a day (QID) | OROMUCOSAL | Status: DC
  Administered 2024-10-24 – 2024-10-29 (×18): 500000 [IU] via ORAL
  Filled 2024-10-24 (×22): qty 5

## 2024-10-24 MED ORDER — KETOROLAC TROMETHAMINE 15 MG/ML IJ SOLN
30.0000 mg | Freq: Four times a day (QID) | INTRAMUSCULAR | Status: DC
  Administered 2024-10-24 (×2): 30 mg via INTRAVENOUS
  Filled 2024-10-24 (×2): qty 2

## 2024-10-24 MED ORDER — OMEPRAZOLE 20 MG PO TBDD
20.0000 mg | DELAYED_RELEASE_TABLET | Freq: Every day | ORAL | Status: DC
  Administered 2024-10-24 – 2024-10-25 (×2): 20 mg via ORAL
  Filled 2024-10-24 (×2): qty 1

## 2024-10-24 NOTE — Progress Notes (Signed)
 Pt made this nurse aware when she had a bowel movement it had a small amount of bright red blood.

## 2024-10-24 NOTE — Plan of Care (Signed)

## 2024-10-24 NOTE — Progress Notes (Signed)
 Progress Note  5 Days Post-Op  Interval: Bowel movement yesterday without blood in stool. States some minimal blood when wiping. No obvious fissure or excoriation on exam. Tearful this AM due to pain and frustration. Feels her nausea and dizziness that she has occasionally is related to getting multiple meds at once. H/H downtrending again this morning--at least in part dilutional and reassuring no more blood in stools. Diffuse abdominal pain but soft. Passing flatus. KUB yesterday unremarkable   Objective: Vital signs in last 24 hours: Temp:  [97.6 F (36.4 C)-98 F (36.7 C)] 97.9 F (36.6 C) (11/26 0808) Pulse Rate:  [77-85] 80 (11/26 0808) Resp:  [18] 18 (11/26 0327) BP: (133-146)/(60-103) 141/67 (11/26 0808) SpO2:  [97 %-99 %] 99 % (11/26 0808) Last BM Date : 10/23/24  Intake/Output from previous day: 11/25 0701 - 11/26 0700 In: 980 [P.O.:980] Out: -  Intake/Output this shift: No intake/output data recorded.  PE: General: NAD Heart: regular, rate, and rhythm Lungs: Normal work of breathing on room air Abd: soft, mildly distended, slightly improved from yesterday, diffuse tenderness to palpation but no guarding or signs of peritonitis. Ecchymosis around several incisions, primarily pfannenstiel MS: all 4 extremities are symmetrical with no cyanosis, clubbing, or edema. Skin: warm and dry with no masses, lesions, or rashes Neuro: No focal neurologic deficits Psych: A&Ox3 with an appropriate affect.    Lab Results:  Recent Labs    10/23/24 0448 10/24/24 0314  WBC 10.3 7.5  HGB 12.4 11.0*  HCT 36.3 32.4*  PLT 267 256   BMET Recent Labs    10/23/24 0448 10/24/24 0314  NA 138 139  K 4.3 3.7  CL 105 103  CO2 24 27  GLUCOSE 89 101*  BUN 6 <5*  CREATININE 0.86 0.93  CALCIUM  8.2* 8.1*   PT/INR Recent Labs    10/22/24 0532  LABPROT 14.9  INR 1.1   CMP     Component Value Date/Time   NA 139 10/24/2024 0314   NA 142 07/26/2018 1420   K 3.7  10/24/2024 0314   CL 103 10/24/2024 0314   CO2 27 10/24/2024 0314   GLUCOSE 101 (H) 10/24/2024 0314   BUN <5 (L) 10/24/2024 0314   BUN 6 07/26/2018 1420   CREATININE 0.93 10/24/2024 0314   CREATININE 0.92 08/23/2024 1633   CALCIUM  8.1 (L) 10/24/2024 0314   PROT 6.6 10/15/2024 1344   PROT 6.4 07/26/2018 1420   ALBUMIN  3.0 (L) 10/20/2024 1146   ALBUMIN  4.0 07/26/2018 1420   AST 27 10/15/2024 1344   ALT 25 10/15/2024 1344   ALKPHOS 77 10/15/2024 1344   BILITOT 0.9 10/15/2024 1344   BILITOT 0.2 07/26/2018 1420   GFRNONAA >60 10/24/2024 0314   GFRAA 94 07/26/2018 1420   Lipase     Component Value Date/Time   LIPASE 13 08/23/2024 1633       Studies/Results: DG Abd 1 View Result Date: 10/23/2024 EXAM: 1 VIEW XRAY OF THE ABDOMEN 10/23/2024 10:09:00 AM COMPARISON: CT of the abdomen and pelvis dated 09/28/2024. CLINICAL HISTORY: Nausea. FINDINGS: BOWEL: Nonobstructive bowel gas pattern. SOFT TISSUES: No opaque urinary calculi. 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 No acute osseous abnormality. IMPRESSION: 1. No acute findings. Electronically signed by: Evalene Coho MD 10/23/2024 10:22 AM EST RP Workstation: [id number]     Assessment/Plan 53 y.o. female s/p partial colectomy for diverticulitis with primary anastomosis on 10/19/24  LOS: 5 days   - Continue soft diet - Will order CT to  assess for source of continued Hb drop, will defer PO contrast at this time given she is having bowel function, no leukocytosis, no peritonitis and do not want enteral contrast to potentially complicate identifying source of bleeding if present. - Pain medications: robaxin  750 TID, gaba 400 TID, oxy 5q4h PRN, + additional 5mg  if needed fentanyl  25q2h PRN, tylenol  sch, lidoderm  patchs - Lovenox --if PM CBC dropping or CT shows signs of active bleeding will hold again but high risk for DVT post-op and HDS   I reviewed nursing notes, last 24 h vitals and pain scores, last  48 h intake and output, last 24 h labs and trends, and last 24 h imaging results.    Orie Silversmith, MD General Surgery, Surgical Critical Care and Trauma 10/24/2024, 8:25 AM

## 2024-10-24 NOTE — Progress Notes (Signed)
 Occupational Therapy Treatment Patient Details Name: Christina Potts MRN: [id number] DOB: 10-13-1971 Today's Date: 10/24/2024   History of present illness Pt is a 53 y/o F who presented for laparascopic partial colectomy and flexible sigmoidoscopy on 10/19/24. REceived 1 unit PRBC 11/24. PMHx: diverticulosis with recurrent diverticulitis, arthritis, anxiety, depression, IBS, HTN, GERD.   OT comments  Patient up ambulating in room upon entry.  Patient demonstrated mobility in room, toilet transfer, and grooming standing at sink with supervision.  Patient demonstrated good understanding of AE use from previous session and was able to doff and donn socks with AE.  Patient was provided an energy conservation handout and reviewed with patient.  Discharge recommendations continue to be appropriate.  Acute OT to continue to follow to address established goals.       If plan is discharge home, recommend the following:  A little help with walking and/or transfers;Assistance with cooking/housework;Assist for transportation;A little help with bathing/dressing/bathroom   Equipment Recommendations  Tub/shower seat    Recommendations for Other Services      Precautions / Restrictions Precautions Precautions: None Precaution/Restrictions Comments: s/p parial colectomy Restrictions Weight Bearing Restrictions Per Provider Order: No       Mobility Bed Mobility Overal bed mobility: Needs Assistance             General bed mobility comments: OOB, standing in room upon entry and left in chair at end of session    Transfers Overall transfer level: Needs assistance Equipment used: Rolling walker (2 wheels) Transfers: Sit to/from Stand Sit to Stand: Supervision           General transfer comment: supervision for safety     Balance Overall balance assessment: Needs assistance Sitting-balance support: No upper extremity supported Sitting balance-Leahy Scale: Good     Standing  balance support: Bilateral upper extremity supported, No upper extremity supported, During functional activity Standing balance-Leahy Scale: Good Standing balance comment: able to stand with on UE support, RW for support with mobility                           ADL either performed or assessed with clinical judgement   ADL Overall ADL's : Needs assistance/impaired     Grooming: Wash/dry hands;Supervision/safety;Standing               Lower Body Dressing: Supervision/safety;With adaptive equipment;Sitting/lateral leans Lower Body Dressing Details (indicate cue type and reason): able to doff socks with dressing stick and donn socks with sock aide Toilet Transfer: Supervision/safety;Ambulation;Regular Toilet;Rolling walker (2 wheels)             General ADL Comments: staff states patient takes self to bathroom    Extremity/Trunk Assessment              Vision       Perception     Praxis     Communication Communication Communication: No apparent difficulties   Cognition Arousal: Alert Behavior During Therapy: WFL for tasks assessed/performed Cognition: No apparent impairments                               Following commands: Intact        Cueing   Cueing Techniques: Verbal cues, Gestural cues  Exercises      Shoulder Instructions       General Comments handout provided on energy conservation and reviewed with patient    Pertinent Vitals/  Pain       Pain Assessment Pain Assessment: 0-10 Pain Score: 8  Pain Location: back and abdomen Pain Descriptors / Indicators: Discomfort, Grimacing, Guarding Pain Intervention(s): Limited activity within patient's tolerance, Monitored during session, Premedicated before session, Repositioned  Home Living                                          Prior Functioning/Environment              Frequency  Min 2X/week        Progress Toward Goals  OT  Goals(current goals can now be found in the care plan section)  Progress towards OT goals: Progressing toward goals  Acute Rehab OT Goals Patient Stated Goal: less pain OT Goal Formulation: With patient Time For Goal Achievement: 11/03/24 Potential to Achieve Goals: Good ADL Goals Pt Will Perform Grooming: with modified independence;standing Pt Will Perform Lower Body Bathing: with modified independence;with adaptive equipment;sitting/lateral leans;sit to/from stand Pt Will Perform Lower Body Dressing: with modified independence;with adaptive equipment;sitting/lateral leans;sit to/from stand Pt Will Transfer to Toilet: with modified independence;ambulating;regular height toilet;grab bars Pt Will Perform Toileting - Clothing Manipulation and hygiene: with modified independence;sitting/lateral leans;sit to/from stand Additional ADL Goal #1: Pt will recall and maintain 3 energy conservation/work simplification strategies during ADLs and functional mobility.  Plan      Co-evaluation                 AM-PAC OT 6 Clicks Daily Activity     Outcome Measure   Help from another person eating meals?: None Help from another person taking care of personal grooming?: A Little Help from another person toileting, which includes using toliet, bedpan, or urinal?: A Little Help from another person bathing (including washing, rinsing, drying)?: A Little Help from another person to put on and taking off regular upper body clothing?: A Little Help from another person to put on and taking off regular lower body clothing?: A Little 6 Click Score: 19    End of Session Equipment Utilized During Treatment: Rolling walker (2 wheels);Other (comment) (dressing stick and sock aide)  OT Visit Diagnosis: Other abnormalities of gait and mobility (R26.89);Pain Pain - part of body:  (abdomen and back)   Activity Tolerance Patient tolerated treatment well   Patient Left in chair;with call bell/phone within  reach   Nurse Communication Mobility status        Time: 0810-0830 OT Time Calculation (min): 20 min  Charges: OT General Charges $OT Visit: 1 Visit OT Treatments $Self Care/Home Management : 8-22 mins  Dick Laine, OTA Acute Rehabilitation Services  Office [phone]   Jeb LITTIE Laine 10/24/2024, 12:16 PM

## 2024-10-24 NOTE — Progress Notes (Signed)
 Physical Therapy Treatment Patient Details Name: Christina Potts MRN: [id number] DOB: 1970-12-12 Today's Date: 10/24/2024   History of Present Illness Pt is a 53 y/o F who presented for laparascopic partial colectomy and flexible sigmoidoscopy on 10/19/24. REceived 1 unit PRBC 11/24. PMHx: diverticulosis with recurrent diverticulitis, arthritis, anxiety, depression, IBS, HTN, GERD.    PT Comments  Pt ambulating around room without device upon PT arrival, became tearful quickly regarding frustration of medical status and recovery as well as overall care. PT offered therapeutic listening and emotional support to pt. PT offered support and would discuss pt's concerns with charge RN; information relayed to charge RN following session as well as pt's request for MD to be informed. Charge RN reported that he would follow up with patient on concerns.  Pt and PT reviewed standing and seated BLE exercises for continued strength and endurance gains, reviewed safe mobility with or without use of RW, and planned for stair training as patient has several steps to enter home at both entrances. Pt declined out of room mobility during session. She will continue to benefit from skilled therapy while admitted to acute rehab. Her discharge recommendations remain the same.     If plan is discharge home, recommend the following: A little help with bathing/dressing/bathroom;Assistance with cooking/housework;Assist for transportation;Help with stairs or ramp for entrance;A little help with walking and/or transfers   Can travel by private vehicle        Equipment Recommendations  Rolling walker (2 wheels)    Recommendations for Other Services       Precautions / Restrictions Precautions Precautions: None Precaution/Restrictions Comments: s/p partial colectomy Restrictions Weight Bearing Restrictions Per Provider Order: No     Mobility  Bed Mobility Overal bed mobility: Needs Assistance Bed Mobility:  Supine to Sit, Sit to Supine     Supine to sit: Modified independent (Device/Increase time), Used rails Sit to supine: Modified independent (Device/Increase time), Used rails   General bed mobility comments: pt ambulating around room upon PT arrival, able to manage bed mobility during session with use of bedrails    Transfers Overall transfer level: Independent   Transfers: Sit to/from Stand, Bed to chair/wheelchair/BSC Sit to Stand: Independent           General transfer comment: ambulating around room independently, intermittent use of RW which she reports only using for support with increased pain    Ambulation/Gait Ambulation/Gait assistance: Independent, Modified independent (Device/Increase time) Gait Distance (Feet): 50 Feet Assistive device: Rolling walker (2 wheels)   Gait velocity: decr     General Gait Details: ambulating around the room with and without RW, no balance deficits noted throughout. Pt reports using RW when her pain levels are higher for security and comfort. Continues to report some decreased strength/endurance below her baseline, declined ambulating in hallways due to ongoing frustrations with medical status and care   Stairs Stairs:  (declined activity today. will benefit from stair practice prior to discharge as she has 5-9 steps to enter home depending on entrance used)           Wheelchair Mobility     Tilt Bed    Modified Rankin (Stroke Patients Only)       Balance Overall balance assessment: Needs assistance Sitting-balance support: No upper extremity supported Sitting balance-Leahy Scale: Normal     Standing balance support: Single extremity supported, Bilateral upper extremity supported, No upper extremity supported Standing balance-Leahy Scale: Good Standing balance comment: intermittent UE support needed with standing tasks secondary  to pain levels                            Communication  Communication Communication: No apparent difficulties  Cognition Arousal: Alert Behavior During Therapy: WFL for tasks assessed/performed   PT - Cognitive impairments: No apparent impairments                         Following commands: Intact      Cueing Cueing Techniques: Verbal cues, Gestural cues  Exercises Other Exercises Other Exercises: standing ex at sink: marches, heel raises, hip extension x10 BLE    General Comments General comments (skin integrity, edema, etc.): reviewed standing and seated exercises in room for continued BLE strength and endurance gains      Pertinent Vitals/Pain Pain Assessment Pain Assessment: Faces Faces Pain Scale: Hurts even more Pain Location: back and abdomen Pain Descriptors / Indicators: Discomfort, Grimacing, Guarding Pain Intervention(s): Limited activity within patient's tolerance, Repositioned, Monitored during session    Home Living                          Prior Function            PT Goals (current goals can now be found in the care plan section) Acute Rehab PT Goals Patient Stated Goal: less pain PT Goal Formulation: With patient Time For Goal Achievement: 11/03/24 Potential to Achieve Goals: Fair Progress towards PT goals: Progressing toward goals    Frequency    Min 1X/week      PT Plan      Co-evaluation              AM-PAC PT 6 Clicks Mobility   Outcome Measure  Help needed turning from your back to your side while in a flat bed without using bedrails?: None Help needed moving from lying on your back to sitting on the side of a flat bed without using bedrails?: None Help needed moving to and from a bed to a chair (including a wheelchair)?: None Help needed standing up from a chair using your arms (e.g., wheelchair or bedside chair)?: None Help needed to walk in hospital room?: A Little Help needed climbing 3-5 steps with a railing? : A Little 6 Click Score: 22    End of  Session   Activity Tolerance: Patient tolerated treatment well;Patient limited by pain Patient left: with call bell/phone within reach;Other (comment) (pt seated at EOB, up moving around in room upon PT arrival; left with no alarms on as this is how the pt was found)   PT Visit Diagnosis: Pain;Difficulty in walking, not elsewhere classified (R26.2) Pain - part of body:  (abdomen)     Time: 8575-8558 PT Time Calculation (min) (ACUTE ONLY): 17 min  Charges:    $Therapeutic Activity: 8-22 mins                       Isaiah DEL. Nikesh Teschner, PT, DPT   [employer] 10/24/2024, 4:22 PM

## 2024-10-24 NOTE — Progress Notes (Signed)
 Per radiology unable to do rectal contract due to how recent her surgery was. They need a provider to do the rectal contrast for radiology. [facility] paged.

## 2024-10-24 NOTE — Discharge Instructions (Addendum)
 POST OP INSTRUCTIONS AFTER COLON SURGERY  DIET: Be sure to include lots of fluids daily to stay hydrated - 64oz of water per day (8, 8 oz glasses).  Avoid fast food or heavy meals for the first couple of weeks as your are more likely to get nauseated. Avoid raw/uncooked fruits or vegetables for the first 4 weeks (its ok to have these if they are blended into smoothie form). If you have fruits/vegetables, make sure they are cooked until soft enough to mash on the roof of your mouth and chew your food well. Otherwise, diet as tolerated.  Take your usually prescribed home medications unless otherwise directed.  PAIN CONTROL: Pain is best controlled by a usual combination of three different methods TOGETHER: Ice/Heat Over the counter pain medication Prescription pain medication Most patients will experience some swelling and bruising around the surgical site.  Ice packs or heating pads (30-60 minutes up to 6 times a day) will help. Some people prefer to use ice alone, heat alone, alternating between ice & heat.  Experiment to what works for you.  Swelling and bruising can take several weeks to resolve.   It is helpful to take an over-the-counter pain medication regularly for the first few weeks: Ibuprofen  (Motrin /Advil ) - 200mg  tabs - take 3 tabs (600mg ) every 6 hours as needed for pain (unless you have been directed previously to avoid NSAIDs/ibuprofen ) Acetaminophen  (Tylenol ) - you may take 1000mg  every 6 hours as needed. You can take this with motrin  as they act differently on the body. If you are taking a narcotic pain medication that has acetaminophen  in it, do not take over the counter tylenol  at the same time. NOTE: You may take both of these medications together - most patients  find it most helpful when alternating between the two (i.e. Ibuprofen  at 6am, tylenol  at 9am, ibuprofen  at 12pm ..SABRA) A  prescription for pain medication should be given to you upon discharge.  Take your pain medication as  prescribed if your pain is not adequatly controlled with the over-the-counter pain reliefs mentioned above.  Avoid getting constipated.  Between the surgery and the pain medications, it is common to experience some constipation.  Increasing fluid intake and taking a fiber supplement (such as Metamucil, Citrucel, FiberCon, MiraLax , etc) 1-2 times a day regularly will usually help prevent this problem from occurring.  A mild laxative (prune juice, Milk of Magnesia, MiraLax , etc) should be taken according to package directions if there are no bowel movements after 48 hours.    Dressing: Your incisions are covered in Dermabond which is like sterile superglue for the skin. This will come off on it's own in a couple weeks. It is waterproof and you may bathe normally starting the day after your surgery in a shower. Avoid baths/pools/lakes/oceans until your wounds have fully healed.  ACTIVITIES as tolerated:   Avoid heavy lifting (>10lbs or 1 gallon of milk) for the next 6 weeks. You may resume regular daily activities as tolerated--such as daily self-care, walking, climbing stairs--gradually increasing activities as tolerated.  If you can walk 30 minutes without difficulty, it is safe to try more intense activity such as jogging, treadmill, bicycling, low-impact aerobics.  DO NOT PUSH THROUGH PAIN.  Let pain be your guide: If it hurts to do something, don't do it. You may drive when you are no longer taking prescription pain medication, you can comfortably wear a seatbelt, and you can safely maneuver your car and apply brakes.  FOLLOW UP in our  office Please call [facility] at [phone] to set up an appointment to see your surgeon in the office for a follow-up appointment approximately 2 weeks after your surgery. Make sure that you call for this appointment the day you arrive home to insure a convenient appointment time.  9. If you have disability or family leave forms that need to be completed, you may have  them completed by your primary care physician's office; for return to work instructions, please ask our office staff and they will be happy to assist you in obtaining this documentation   When to call us  [phone]: Poor pain control Reactions / problems with new medications (rash/itching, etc)  Fever over 101.5 F (38.5 C) Inability to urinate Nausea/vomiting Worsening swelling or bruising Continued bleeding from incision. Increased pain, redness, or drainage from the incision  The clinic staff is available to answer your questions during regular business hours (8:30am-5pm).  Please don't hesitate to call and ask to speak to one of our nurses for clinical concerns.   A surgeon from [facility] is always on call at the hospitals   If you have a medical emergency, go to the nearest emergency room or call 911.  [facility] [address], [place], [place]  [address] MAIN: [phone] FAX: [phone] [email or web address]

## 2024-10-25 MED ORDER — ALUM & MAG HYDROXIDE-SIMETH 200-200-20 MG/5ML PO SUSP
30.0000 mL | ORAL | Status: DC | PRN
  Administered 2024-10-25: 30 mL via ORAL
  Filled 2024-10-25: qty 30

## 2024-10-25 MED ORDER — OMEPRAZOLE 20 MG PO TBDD
40.0000 mg | DELAYED_RELEASE_TABLET | Freq: Two times a day (BID) | ORAL | Status: DC
  Administered 2024-10-25 – 2024-10-29 (×8): 40 mg via ORAL
  Filled 2024-10-25 (×8): qty 2

## 2024-10-25 NOTE — Progress Notes (Signed)
 Progress Note  6 Days Post-Op  Interval: Continued pain, notes that she does have some sharp epigastric pain this morning.  Some nausea after receiving meds on an empty stomach.  CT with rectal contrast negative for leak yesterday evening.   Objective: Vital signs in last 24 hours: Temp:  [97.4 F (36.3 C)-98.1 F (36.7 C)] 97.4 F (36.3 C) (11/27 0805) Pulse Rate:  [79-93] 82 (11/27 0805) Resp:  [18] 18 (11/27 0430) BP: (115-158)/(64-98) 138/86 (11/27 0805) SpO2:  [95 %-100 %] 97 % (11/27 0805) Weight:  [108.1 kg] 108.1 kg (11/27 0500) Last BM Date : 10/24/24  Intake/Output from previous day: 11/26 0701 - 11/27 0700 In: 400 [P.O.:400] Out: -  Intake/Output this shift: No intake/output data recorded.  PE: General: NAD Heart: regular, rate, and rhythm Lungs: Normal work of breathing on room air Abd: soft, mildly distended, diffuse tenderness to palpation but no guarding or signs of peritonitis. Ecchymosis around several incisions, primarily pfannenstiel but all are clean and dry without complicating features MS: all 4 extremities are symmetrical with no cyanosis, clubbing, or edema. Skin: warm and dry with no masses, lesions, or rashes Neuro: No focal neurologic deficits Psych: A&Ox3 with an appropriate affect.    Lab Results:  Recent Labs    10/24/24 0314 10/24/24 1359  WBC 7.5 9.9  HGB 11.0* 12.0  HCT 32.4* 35.9*  PLT 256 274   BMET Recent Labs    10/23/24 0448 10/24/24 0314  NA 138 139  K 4.3 3.7  CL 105 103  CO2 24 27  GLUCOSE 89 101*  BUN 6 <5*  CREATININE 0.86 0.93  CALCIUM  8.2* 8.1*   PT/INR No results for input(s): LABPROT, INR in the last 72 hours.  CMP     Component Value Date/Time   NA 139 10/24/2024 0314   NA 142 07/26/2018 1420   K 3.7 10/24/2024 0314   CL 103 10/24/2024 0314   CO2 27 10/24/2024 0314   GLUCOSE 101 (H) 10/24/2024 0314   BUN <5 (L) 10/24/2024 0314   BUN 6 07/26/2018 1420   CREATININE 0.93 10/24/2024 0314    CREATININE 0.92 08/23/2024 1633   CALCIUM  8.1 (L) 10/24/2024 0314   PROT 6.6 10/15/2024 1344   PROT 6.4 07/26/2018 1420   ALBUMIN  3.0 (L) 10/20/2024 1146   ALBUMIN  4.0 07/26/2018 1420   AST 27 10/15/2024 1344   ALT 25 10/15/2024 1344   ALKPHOS 77 10/15/2024 1344   BILITOT 0.9 10/15/2024 1344   BILITOT 0.2 07/26/2018 1420   GFRNONAA >60 10/24/2024 0314   GFRAA 94 07/26/2018 1420   Lipase     Component Value Date/Time   LIPASE 13 08/23/2024 1633       Studies/Results: CT ABDOMEN PELVIS WO CONTRAST Result Date: 10/24/2024 EXAM: CT ABDOMEN AND PELVIS WITHOUT CONTRAST 10/24/2024 10:14:29 PM TECHNIQUE: CT of the abdomen and pelvis was performed without the administration of intravenous contrast. Imaging was initially performed prior to the administration of rectal contrast. Subsequent imaging was performed following the administration of rectal contrast. Multiplanar reformatted images are provided for review. Automated exposure control, iterative reconstruction, and/or weight-based adjustment of the mA/kV was utilized to reduce the radiation dose to as low as reasonably achievable. COMPARISON: 09/28/2024 CLINICAL HISTORY: evaluate for anastomotic leak postoperative day 5 from colorectal anastomosis. FINDINGS: LOWER CHEST: No acute abnormality. LIVER: The liver is unremarkable. GALLBLADDER AND BILE DUCTS: Gallbladder is unremarkable. No biliary ductal dilatation. SPLEEN: No acute abnormality. PANCREAS: No acute abnormality. ADRENAL GLANDS: No acute  abnormality. KIDNEYS, URETERS AND BLADDER: No stones in the kidneys or ureters. No hydronephrosis. No perinephric or periureteral stranding. Urinary bladder is unremarkable. GI AND BOWEL: Stomach demonstrates no acute abnormality. There is no bowel obstruction. Suture line seen in the rectosigmoid colon. Extensive perirectal air inferior/distal to the suture line which may reflect air in the wall of the rectum. No contrast extravasation/leak noted from  the rectosigmoid anastomosis following administration of rectal contrast or on delayed images . Scattered colonic diverticulosis. PERITONEUM AND RETROPERITONEUM: Free air in the abdomen and pelvis and in the anterior abdominal wall related to recent surgery. No ascites. VASCULATURE: Aorta is normal in caliber. LYMPH NODES: No lymphadenopathy. REPRODUCTIVE ORGANS: No acute abnormality. BONES AND SOFT TISSUES: No acute osseous abnormality. No focal soft tissue abnormality. IMPRESSION: 1. No evidence of anastomotic leak following administration of rectal contrast. 2. Extensive perirectal air, possibly within the rectal wall. 3. Free intraperitoneal and anterior abdominal wall air, likely postoperative. Electronically signed by: Franky Crease MD 10/24/2024 10:25 PM EST RP Workstation: [id number]   CT ANGIO GI BLEED Result Date: 10/24/2024 CLINICAL DATA:  Pain more than expected post op, drifting H/H EXAM: CTA ABDOMEN AND PELVIS WITHOUT AND WITH CONTRAST TECHNIQUE: Initially, a noncontrast CT of the abdomen and pelvis was performed. Subsequently, multidetector CT imaging of the abdomen and pelvis was performed using the standard protocol during bolus administration of intravenous contrast. Multiplanar reconstructed images and MIPs were obtained and reviewed to evaluate the vascular anatomy. RADIATION DOSE REDUCTION: This exam was performed according to the departmental dose-optimization program which includes automated exposure control, adjustment of the mA and/or kV according to patient size and/or use of iterative reconstruction technique. CONTRAST:  75mL OMNIPAQUE  IOHEXOL  350 MG/ML SOLN COMPARISON:  09/28/2024 FINDINGS: VASCULAR Aorta: No aortic aneurysm or dissection. No hemodynamically significant stenosis. Celiac: Patent without acute thrombus, aneurysm, or dissection.No hemodynamically significant stenosis. SMA: Patent without acute thrombus, aneurysm, or dissection.No hemodynamically significant stenosis.  Renals: Patent without acute thrombus, aneurysm, or dissection.No hemodynamically significant stenosis. IMA: Patent without acute thrombus, aneurysm, or dissection.No hemodynamically significant stenosis. Inflow: Patent without acute thrombus, aneurysm, or dissection.No hemodynamically significant stenosis. Proximal Outflow: The bilateral common femoral and visualized portions of the superficial and profunda femoral arteries are patent without acute thrombus, aneurysm, or dissection.No hemodynamically significant stenosis. Veins: The portal and superior mesenteric veins are patent. The hepatic veins appear patent. Review of the MIP images confirms the above findings. NON-VASCULAR Lower chest: No focal airspace consolidation or pleural effusion.Subsegmental atelectasis in both lung bases. Hepatobiliary: No mass.No radiopaque stones or wall thickening of the gallbladder.No intrahepatic or extrahepatic biliary ductal dilation. Pancreas: No mass or main ductal dilation.No peripancreatic inflammation or fluid collection. Spleen: Normal size. No mass. Adrenals/Urinary Tract: No adrenal masses. No renal mass. No hydronephrosis or nephrolithiasis. Partially distended urinary bladder without visualized abnormality. Stomach/Bowel: The stomach contains ingested material without focal abnormality. No small bowel wall thickening or inflammation. No small bowel obstruction.The appendix was not visualized. No right lower quadrant or pericecal inflammatory changes to suggest acute appendicitis. Scattered total colonic diverticulosis. No changes of acute diverticulitis. Rectosigmoid anastomosis. GI Bleed: No extravasation of contrast to suggest active GI bleeding. Lymphatic: No intraabdominal or pelvic lymphadenopathy. Reproductive: Hysterectomy.No free pelvic fluid. Other: Scattered, small volume pneumoperitoneum with gas interdigitating within the anterior abdominal wall musculature. Small amount of inflammatory stranding and  layering fluid in the left paracolic gutter. Extensive pericolonic gas in the rectosigmoid mesocolon. Musculoskeletal: No acute fracture or destructive lesion.Intervertebral disc replacement and posterior fusion  hardware at L4-L5. Multilevel degenerative disc disease of the spine. IMPRESSION: VASCULAR No aortic aneurysm, intramural hematoma, or aortic dissection. NON-VASCULAR Postsurgical changes consistent with recent partial colectomy. Extensive pericolonic gas in the rectosigmoid mesocolon with a small amount of pneumoperitoneum, which may be an expected finding at this stage in the postoperative recovery. No intra-abdominal abscess or perianastomotic fluid collection. No findings to suggest active GI bleeding. Electronically Signed   By: Rogelia Myers M.D.   On: 10/24/2024 16:16   DG Abd 1 View Result Date: 10/23/2024 EXAM: 1 VIEW XRAY OF THE ABDOMEN 10/23/2024 10:09:00 AM COMPARISON: CT of the abdomen and pelvis dated 09/28/2024. CLINICAL HISTORY: Nausea. FINDINGS: BOWEL: Nonobstructive bowel gas pattern. SOFT TISSUES: No opaque urinary calculi. 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 No acute osseous abnormality. IMPRESSION: 1. No acute findings. Electronically signed by: Evalene Coho MD 10/23/2024 10:22 AM EST RP Workstation: [id number]     Assessment/Plan 53 y.o. female s/p partial colectomy for diverticulitis with primary anastomosis on 10/19/24  LOS: 6 days   - Resume soft diet - Hemoglobin is stable and CT is negative for complication other than perirectal air, no leak on rectal contrast CT - Continue multimodal pain control - Lovenox --if PM CBC dropping or CT shows signs of active bleeding will hold again but high risk for DVT post-op and HDS - Mobilize, pulmonary toilet, check labs in a.m.  I reviewed nursing notes, last 24 h vitals and pain scores, last 48 h intake and output, last 24 h labs and trends, and last 24 h imaging results.   Mitzie DELENA Freund , MD General Surgery, Surgical Critical Care and Trauma 10/25/2024, 9:32 AM

## 2024-10-26 LAB — CBC
HCT: 34.7 % — ABNORMAL LOW (ref 36.0–46.0)
Hemoglobin: 11.6 g/dL — ABNORMAL LOW (ref 12.0–15.0)
MCH: 30.7 pg (ref 26.0–34.0)
MCHC: 33.4 g/dL (ref 30.0–36.0)
MCV: 91.8 fL (ref 80.0–100.0)
Platelets: 287 K/uL (ref 150–400)
RBC: 3.78 MIL/uL — ABNORMAL LOW (ref 3.87–5.11)
RDW: 13.4 % (ref 11.5–15.5)
WBC: 7.1 K/uL (ref 4.0–10.5)
nRBC: 0 % (ref 0.0–0.2)

## 2024-10-26 LAB — BASIC METABOLIC PANEL WITH GFR
Anion gap: 8 (ref 5–15)
BUN: 5 mg/dL — ABNORMAL LOW (ref 6–20)
CO2: 30 mmol/L (ref 22–32)
Calcium: 8.1 mg/dL — ABNORMAL LOW (ref 8.9–10.3)
Chloride: 100 mmol/L (ref 98–111)
Creatinine, Ser: 0.9 mg/dL (ref 0.44–1.00)
GFR, Estimated: >60 mL/min (ref >60)
Glucose, Bld: 104 mg/dL — ABNORMAL HIGH (ref 70–99)
Potassium: 3.8 mmol/L (ref 3.5–5.1)
Sodium: 138 mmol/L (ref 135–145)

## 2024-10-26 LAB — MAGNESIUM: Magnesium: 1.8 mg/dL (ref 1.7–2.4)

## 2024-10-26 MED ORDER — DIPHENHYDRAMINE HCL 25 MG PO CAPS
25.0000 mg | ORAL_CAPSULE | Freq: Four times a day (QID) | ORAL | Status: DC | PRN
  Administered 2024-10-26: 25 mg via ORAL
  Filled 2024-10-26 (×2): qty 1

## 2024-10-26 NOTE — Plan of Care (Signed)

## 2024-10-26 NOTE — Progress Notes (Signed)
 Progress Note  7 Days Post-Op  Interval: Slightly better today.  Pain is slightly improved.  Still having bowel movements but seem decreased in amount.   Objective: Vital signs in last 24 hours: Temp:  [97.5 F (36.4 C)-98.3 F (36.8 C)] 98.3 F (36.8 C) (11/28 0732) Pulse Rate:  [72-91] 80 (11/28 0732) Resp:  [16] 16 (11/28 0344) BP: (139-157)/(64-83) 157/83 (11/28 0732) SpO2:  [95 %-100 %] 95 % (11/28 0732) Weight:  [99.6 kg] 99.6 kg (11/28 0500) Last BM Date : 10/24/24  Intake/Output from previous day: No intake/output data recorded. Intake/Output this shift: No intake/output data recorded.  PE: General: NAD Heart: regular, rate, and rhythm Lungs: Normal work of breathing on room air Abd: soft, nondistended, appropriately tender.  Incisions are clean, dry and intact with mild ecchymosis but no complicating features MS: all 4 extremities are symmetrical with no cyanosis, clubbing, or edema. Skin: warm and dry with no masses, lesions, or rashes Neuro: No focal neurologic deficits Psych: A&Ox3 with an appropriate affect.    Lab Results:  Recent Labs    10/24/24 1359 10/26/24 0540  WBC 9.9 7.1  HGB 12.0 11.6*  HCT 35.9* 34.7*  PLT 274 287   BMET Recent Labs    10/24/24 0314 10/26/24 0540  NA 139 138  K 3.7 3.8  CL 103 100  CO2 27 30  GLUCOSE 101* 104*  BUN <5* 5*  CREATININE 0.93 0.90  CALCIUM  8.1* 8.1*   PT/INR No results for input(s): LABPROT, INR in the last 72 hours.  CMP     Component Value Date/Time   NA 138 10/26/2024 0540   NA 142 07/26/2018 1420   K 3.8 10/26/2024 0540   CL 100 10/26/2024 0540   CO2 30 10/26/2024 0540   GLUCOSE 104 (H) 10/26/2024 0540   BUN 5 (L) 10/26/2024 0540   BUN 6 07/26/2018 1420   CREATININE 0.90 10/26/2024 0540   CREATININE 0.92 08/23/2024 1633   CALCIUM  8.1 (L) 10/26/2024 0540   PROT 6.6 10/15/2024 1344   PROT 6.4 07/26/2018 1420   ALBUMIN  3.0 (L) 10/20/2024 1146   ALBUMIN  4.0 07/26/2018 1420    AST 27 10/15/2024 1344   ALT 25 10/15/2024 1344   ALKPHOS 77 10/15/2024 1344   BILITOT 0.9 10/15/2024 1344   BILITOT 0.2 07/26/2018 1420   GFRNONAA >60 10/26/2024 0540   GFRAA 94 07/26/2018 1420   Lipase     Component Value Date/Time   LIPASE 13 08/23/2024 1633       Studies/Results: CT ABDOMEN PELVIS WO CONTRAST Result Date: 10/24/2024 EXAM: CT ABDOMEN AND PELVIS WITHOUT CONTRAST 10/24/2024 10:14:29 PM TECHNIQUE: CT of the abdomen and pelvis was performed without the administration of intravenous contrast. Imaging was initially performed prior to the administration of rectal contrast. Subsequent imaging was performed following the administration of rectal contrast. Multiplanar reformatted images are provided for review. Automated exposure control, iterative reconstruction, and/or weight-based adjustment of the mA/kV was utilized to reduce the radiation dose to as low as reasonably achievable. COMPARISON: 09/28/2024 CLINICAL HISTORY: evaluate for anastomotic leak postoperative day 5 from colorectal anastomosis. FINDINGS: LOWER CHEST: No acute abnormality. LIVER: The liver is unremarkable. GALLBLADDER AND BILE DUCTS: Gallbladder is unremarkable. No biliary ductal dilatation. SPLEEN: No acute abnormality. PANCREAS: No acute abnormality. ADRENAL GLANDS: No acute abnormality. KIDNEYS, URETERS AND BLADDER: No stones in the kidneys or ureters. No hydronephrosis. No perinephric or periureteral stranding. Urinary bladder is unremarkable. GI AND BOWEL: Stomach demonstrates no acute abnormality. There is  no bowel obstruction. Suture line seen in the rectosigmoid colon. Extensive perirectal air inferior/distal to the suture line which may reflect air in the wall of the rectum. No contrast extravasation/leak noted from the rectosigmoid anastomosis following administration of rectal contrast or on delayed images . Scattered colonic diverticulosis. PERITONEUM AND RETROPERITONEUM: Free air in the abdomen and  pelvis and in the anterior abdominal wall related to recent surgery. No ascites. VASCULATURE: Aorta is normal in caliber. LYMPH NODES: No lymphadenopathy. REPRODUCTIVE ORGANS: No acute abnormality. BONES AND SOFT TISSUES: No acute osseous abnormality. No focal soft tissue abnormality. IMPRESSION: 1. No evidence of anastomotic leak following administration of rectal contrast. 2. Extensive perirectal air, possibly within the rectal wall. 3. Free intraperitoneal and anterior abdominal wall air, likely postoperative. Electronically signed by: Franky Crease MD 10/24/2024 10:25 PM EST RP Workstation: [id number]   CT ANGIO GI BLEED Result Date: 10/24/2024 CLINICAL DATA:  Pain more than expected post op, drifting H/H EXAM: CTA ABDOMEN AND PELVIS WITHOUT AND WITH CONTRAST TECHNIQUE: Initially, a noncontrast CT of the abdomen and pelvis was performed. Subsequently, multidetector CT imaging of the abdomen and pelvis was performed using the standard protocol during bolus administration of intravenous contrast. Multiplanar reconstructed images and MIPs were obtained and reviewed to evaluate the vascular anatomy. RADIATION DOSE REDUCTION: This exam was performed according to the departmental dose-optimization program which includes automated exposure control, adjustment of the mA and/or kV according to patient size and/or use of iterative reconstruction technique. CONTRAST:  75mL OMNIPAQUE  IOHEXOL  350 MG/ML SOLN COMPARISON:  09/28/2024 FINDINGS: VASCULAR Aorta: No aortic aneurysm or dissection. No hemodynamically significant stenosis. Celiac: Patent without acute thrombus, aneurysm, or dissection.No hemodynamically significant stenosis. SMA: Patent without acute thrombus, aneurysm, or dissection.No hemodynamically significant stenosis. Renals: Patent without acute thrombus, aneurysm, or dissection.No hemodynamically significant stenosis. IMA: Patent without acute thrombus, aneurysm, or dissection.No hemodynamically significant  stenosis. Inflow: Patent without acute thrombus, aneurysm, or dissection.No hemodynamically significant stenosis. Proximal Outflow: The bilateral common femoral and visualized portions of the superficial and profunda femoral arteries are patent without acute thrombus, aneurysm, or dissection.No hemodynamically significant stenosis. Veins: The portal and superior mesenteric veins are patent. The hepatic veins appear patent. Review of the MIP images confirms the above findings. NON-VASCULAR Lower chest: No focal airspace consolidation or pleural effusion.Subsegmental atelectasis in both lung bases. Hepatobiliary: No mass.No radiopaque stones or wall thickening of the gallbladder.No intrahepatic or extrahepatic biliary ductal dilation. Pancreas: No mass or main ductal dilation.No peripancreatic inflammation or fluid collection. Spleen: Normal size. No mass. Adrenals/Urinary Tract: No adrenal masses. No renal mass. No hydronephrosis or nephrolithiasis. Partially distended urinary bladder without visualized abnormality. Stomach/Bowel: The stomach contains ingested material without focal abnormality. No small bowel wall thickening or inflammation. No small bowel obstruction.The appendix was not visualized. No right lower quadrant or pericecal inflammatory changes to suggest acute appendicitis. Scattered total colonic diverticulosis. No changes of acute diverticulitis. Rectosigmoid anastomosis. GI Bleed: No extravasation of contrast to suggest active GI bleeding. Lymphatic: No intraabdominal or pelvic lymphadenopathy. Reproductive: Hysterectomy.No free pelvic fluid. Other: Scattered, small volume pneumoperitoneum with gas interdigitating within the anterior abdominal wall musculature. Small amount of inflammatory stranding and layering fluid in the left paracolic gutter. Extensive pericolonic gas in the rectosigmoid mesocolon. Musculoskeletal: No acute fracture or destructive lesion.Intervertebral disc replacement and  posterior fusion hardware at L4-L5. Multilevel degenerative disc disease of the spine. IMPRESSION: VASCULAR No aortic aneurysm, intramural hematoma, or aortic dissection. NON-VASCULAR Postsurgical changes consistent with recent partial colectomy. Extensive pericolonic gas in the  rectosigmoid mesocolon with a small amount of pneumoperitoneum, which may be an expected finding at this stage in the postoperative recovery. No intra-abdominal abscess or perianastomotic fluid collection. No findings to suggest active GI bleeding. Electronically Signed   By: Rogelia Myers M.D.   On: 10/24/2024 16:16     Assessment/Plan 53 y.o. female s/p partial colectomy for diverticulitis with primary anastomosis on 10/19/24  LOS: 7 days   - Continue soft diet - Hemoglobin is stable and CT is negative for complication other than perirectal air, no leak on rectal contrast CT - Continue multimodal pain control - Lovenox --if PM CBC dropping or CT shows signs of active bleeding will hold again but high risk for DVT post-op and HDS - Mobilize, pulmonary toilet  Hopefully will be able to go home in the next day or 2.  I reviewed nursing notes, last 24 h vitals and pain scores, last 48 h intake and output, last 24 h labs and trends, and last 24 h imaging results.   Christina Potts , MD General Surgery, Surgical Critical Care and Trauma 10/26/2024, 9:24 AM

## 2024-10-27 LAB — BASIC METABOLIC PANEL WITH GFR
Anion gap: 8 (ref 5–15)
BUN: 5 mg/dL — ABNORMAL LOW (ref 6–20)
CO2: 28 mmol/L (ref 22–32)
Calcium: 8.1 mg/dL — ABNORMAL LOW (ref 8.9–10.3)
Chloride: 101 mmol/L (ref 98–111)
Creatinine, Ser: 1.06 mg/dL — ABNORMAL HIGH (ref 0.44–1.00)
GFR, Estimated: >60 mL/min (ref >60)
Glucose, Bld: 91 mg/dL (ref 70–99)
Potassium: 4 mmol/L (ref 3.5–5.1)
Sodium: 137 mmol/L (ref 135–145)

## 2024-10-27 LAB — CBC
HCT: 35.6 % — ABNORMAL LOW (ref 36.0–46.0)
Hemoglobin: 11.7 g/dL — ABNORMAL LOW (ref 12.0–15.0)
MCH: 30.3 pg (ref 26.0–34.0)
MCHC: 32.9 g/dL (ref 30.0–36.0)
MCV: 92.2 fL (ref 80.0–100.0)
Platelets: 309 10*3/uL (ref 150–400)
RBC: 3.86 MIL/uL — ABNORMAL LOW (ref 3.87–5.11)
RDW: 13.4 % (ref 11.5–15.5)
WBC: 7.1 10*3/uL (ref 4.0–10.5)
nRBC: 0 % (ref 0.0–0.2)

## 2024-10-27 LAB — MAGNESIUM: Magnesium: 1.9 mg/dL (ref 1.7–2.4)

## 2024-10-27 MED ORDER — HYDROCORTISONE 1 % EX CREA
TOPICAL_CREAM | Freq: Four times a day (QID) | CUTANEOUS | Status: DC
  Filled 2024-10-27 (×2): qty 28

## 2024-10-27 MED ORDER — HYDROXYZINE HCL 25 MG PO TABS
25.0000 mg | ORAL_TABLET | Freq: Four times a day (QID) | ORAL | Status: DC | PRN
  Administered 2024-10-27: 25 mg via ORAL
  Filled 2024-10-27: qty 1

## 2024-10-27 MED ORDER — DIPHENHYDRAMINE-ZINC ACETATE 2-0.1 % EX CREA
TOPICAL_CREAM | Freq: Three times a day (TID) | CUTANEOUS | Status: DC | PRN
  Filled 2024-10-27 (×2): qty 28

## 2024-10-27 NOTE — Plan of Care (Signed)

## 2024-10-27 NOTE — Progress Notes (Signed)
 Progress Note  8 Days Post-Op  Interval: Has developed a rash across her abdomen and surrounding her incisions which is pruritic   Objective: Vital signs in last 24 hours: Temp:  [97.6 F (36.4 C)-99.2 F (37.3 C)] 98.2 F (36.8 C) (11/29 0809) Pulse Rate:  [82-96] 87 (11/29 0809) Resp:  [16-18] 18 (11/29 0809) BP: (136-168)/(68-110) 148/95 (11/29 0809) SpO2:  [96 %-100 %] 96 % (11/29 0809) Last BM Date : 10/24/24  Intake/Output from previous day: 11/28 0701 - 11/29 0700 In: 472 [P.O.:472] Out: -  Intake/Output this shift: No intake/output data recorded.  PE: General: NAD Heart: regular, rate, and rhythm Lungs: Normal work of breathing on room air Abd: soft, nondistended, appropriately tender.  Incisions are clean, dry and intact.  Each port site has an approximately 1.5 cm radius area of intense erythema and she has a diffuse maculopapular rash across her abdominal wall as well as in her right antecubital fossa MS: all 4 extremities are symmetrical with no cyanosis, clubbing, or edema. Skin: warm and dry with no masses, lesions, or rashes Neuro: No focal neurologic deficits Psych: A&Ox3 with an appropriate affect.    Lab Results:  Recent Labs    10/26/24 0540 10/27/24 0516  WBC 7.1 7.1  HGB 11.6* 11.7*  HCT 34.7* 35.6*  PLT 287 309   BMET Recent Labs    10/26/24 0540 10/27/24 0516  NA 138 137  K 3.8 4.0  CL 100 101  CO2 30 28  GLUCOSE 104* 91  BUN 5* 5*  CREATININE 0.90 1.06*  CALCIUM  8.1* 8.1*   PT/INR No results for input(s): LABPROT, INR in the last 72 hours.  CMP     Component Value Date/Time   NA 137 10/27/2024 0516   NA 142 07/26/2018 1420   K 4.0 10/27/2024 0516   CL 101 10/27/2024 0516   CO2 28 10/27/2024 0516   GLUCOSE 91 10/27/2024 0516   BUN 5 (L) 10/27/2024 0516   BUN 6 07/26/2018 1420   CREATININE 1.06 (H) 10/27/2024 0516   CREATININE 0.92 08/23/2024 1633   CALCIUM  8.1 (L) 10/27/2024 0516   PROT 6.6 10/15/2024 1344    PROT 6.4 07/26/2018 1420   ALBUMIN  3.0 (L) 10/20/2024 1146   ALBUMIN  4.0 07/26/2018 1420   AST 27 10/15/2024 1344   ALT 25 10/15/2024 1344   ALKPHOS 77 10/15/2024 1344   BILITOT 0.9 10/15/2024 1344   BILITOT 0.2 07/26/2018 1420   GFRNONAA >60 10/27/2024 0516   GFRAA 94 07/26/2018 1420   Lipase     Component Value Date/Time   LIPASE 13 08/23/2024 1633       Studies/Results: No results found.    Assessment/Plan 53 y.o. female s/p partial colectomy for diverticulitis with primary anastomosis on 10/19/24  LOS: 8 days   - Continue soft diet - Labs reassuring and CT 11/26 is negative for complication other than perirectal air, no leak on rectal contrast CT - Continue multimodal pain control - Lovenox  - Mobilize, pulmonary toilet - Atarax, Benadryl  and topical hydrocortisone added for delayed hypersensitivity rash across abdominal wall and in the antecubital fossa.  Remove Dermabond.  Hopefully will be able to go home in the next day or 2.  I reviewed nursing notes, last 24 h vitals and pain scores, last 48 h intake and output, last 24 h labs and trends, and last 24 h imaging results.   Mitzie DELENA Freund , MD General Surgery, Surgical Critical Care and Trauma 10/27/2024, 9:23 AM

## 2024-10-28 MED ORDER — LIDOCAINE HCL 4 % EX SOLN
Freq: Three times a day (TID) | CUTANEOUS | Status: DC | PRN
  Filled 2024-10-28: qty 50

## 2024-10-28 NOTE — Plan of Care (Signed)

## 2024-10-28 NOTE — Progress Notes (Signed)
   Progress Note  9 Days Post-Op  Interval: Discomfort from rash is somewhat improved by hydrocortisone cream and topical lidocaine    Objective: Vital signs in last 24 hours: Temp:  [97.7 F (36.5 C)-98.7 F (37.1 C)] 97.8 F (36.6 C) (11/30 0529) Pulse Rate:  [87-103] 88 (11/30 0529) Resp:  [17-18] 18 (11/30 0529) BP: (140-159)/(72-104) 149/75 (11/30 0529) SpO2:  [97 %-100 %] 97 % (11/30 0529) Weight:  [97.3 kg] 97.3 kg (11/30 0500) Last BM Date : (P) 10/27/24  Intake/Output from previous day: No intake/output data recorded. Intake/Output this shift: No intake/output data recorded.  PE: General: NAD Lungs: Normal work of breathing on room air Abd: soft, nondistended, appropriately tender.  Incisions are clean, dry and intact.  Each port site has an approximately 1.5 cm radius area of intense erythema which is slightly improved compared to yesterday and she has a diffuse maculopapular rash across her abdominal wall as well as in her right antecubital fossa   Lab Results:  Recent Labs    10/26/24 0540 10/27/24 0516  WBC 7.1 7.1  HGB 11.6* 11.7*  HCT 34.7* 35.6*  PLT 287 309   BMET Recent Labs    10/26/24 0540 10/27/24 0516  NA 138 137  K 3.8 4.0  CL 100 101  CO2 30 28  GLUCOSE 104* 91  BUN 5* 5*  CREATININE 0.90 1.06*  CALCIUM  8.1* 8.1*   PT/INR No results for input(s): LABPROT, INR in the last 72 hours.  CMP     Component Value Date/Time   NA 137 10/27/2024 0516   NA 142 07/26/2018 1420   K 4.0 10/27/2024 0516   CL 101 10/27/2024 0516   CO2 28 10/27/2024 0516   GLUCOSE 91 10/27/2024 0516   BUN 5 (L) 10/27/2024 0516   BUN 6 07/26/2018 1420   CREATININE 1.06 (H) 10/27/2024 0516   CREATININE 0.92 08/23/2024 1633   CALCIUM  8.1 (L) 10/27/2024 0516   PROT 6.6 10/15/2024 1344   PROT 6.4 07/26/2018 1420   ALBUMIN  3.0 (L) 10/20/2024 8853   ALBUMIN  4.0 07/26/2018 1420   AST 27 10/15/2024 1344   ALT 25 10/15/2024 1344   ALKPHOS 77 10/15/2024  1344   BILITOT 0.9 10/15/2024 1344   BILITOT 0.2 07/26/2018 1420   GFRNONAA >60 10/27/2024 0516   GFRAA 94 07/26/2018 1420   Lipase     Component Value Date/Time   LIPASE 13 08/23/2024 1633       Studies/Results: No results found.    Assessment/Plan 53 y.o. female s/p partial colectomy for diverticulitis with primary anastomosis on 10/19/24  LOS: 9 days   - Continue soft diet - Labs reassuring and CT 11/26 is negative for complication other than perirectal air, no leak on rectal contrast CT - Continue multimodal pain control, symptom directed treatment for delayed hypersensitivity reaction likely due to prep/Dermabond - Lovenox  - Mobilize, pulmonary toilet   I reviewed nursing notes, last 24 h vitals and pain scores, last 48 h intake and output, last 24 h labs and trends, and last 24 h imaging results.   Mitzie DELENA Freund , MD General Surgery, Surgical Critical Care and Trauma 10/28/2024, 9:27 AM

## 2024-10-29 ENCOUNTER — Other Ambulatory Visit (HOSPITAL_COMMUNITY): Payer: Self-pay

## 2024-10-29 MED ORDER — HYDROXYZINE HCL 25 MG PO TABS
25.0000 mg | ORAL_TABLET | Freq: Three times a day (TID) | ORAL | 0 refills | Status: AC | PRN
  Filled 2024-10-29: qty 21, 7d supply, fill #0

## 2024-10-29 MED ORDER — OXYCODONE HCL 5 MG PO TABS
5.0000 mg | ORAL_TABLET | ORAL | 0 refills | Status: AC | PRN
  Filled 2024-10-29: qty 30, 5d supply, fill #0

## 2024-10-29 MED ORDER — GABAPENTIN 300 MG PO CAPS
300.0000 mg | ORAL_CAPSULE | Freq: Three times a day (TID) | ORAL | 0 refills | Status: AC
  Filled 2024-10-29: qty 21, 7d supply, fill #0

## 2024-10-29 MED ORDER — METHOCARBAMOL 750 MG PO TABS
750.0000 mg | ORAL_TABLET | Freq: Three times a day (TID) | ORAL | 0 refills | Status: AC | PRN
  Filled 2024-10-29: qty 30, 10d supply, fill #0

## 2024-10-29 MED ORDER — HYDROCORTISONE 1 % EX CREA
TOPICAL_CREAM | Freq: Four times a day (QID) | CUTANEOUS | 0 refills | Status: AC
  Filled 2024-10-29: qty 30, 8d supply, fill #0

## 2024-10-29 MED ORDER — LIDOCAINE HCL 4 % EX SOLN
Freq: Three times a day (TID) | CUTANEOUS | 0 refills | Status: AC | PRN
  Filled 2024-10-29: qty 50, fill #0

## 2024-10-29 NOTE — Progress Notes (Signed)
 Occupational Therapy Treatment Patient Details Name: Christina Potts MRN: [id number] DOB: 26-Dec-1970 Today's Date: 10/29/2024   History of present illness Pt is a 53 y/o F who presented for laparascopic partial colectomy and flexible sigmoidoscopy on 10/19/24. REceived 1 unit PRBC 11/24. PMHx: diverticulosis with recurrent diverticulitis, arthritis, anxiety, depression, IBS, HTN, GERD.   OT comments  Pt has made excellent progress towards OT goals. Pt received after completing bathing task independently at sink. Focused on assessment of full dressing tasks without AE today. With cues for positional strategies, pt able to manage full dressing tasks with Modified Independence. Discussed stretches, AROM exercises and gradual building of activity tolerance as pain continues to improve. No postacute OT needs.      If plan is discharge home, recommend the following:  Other (comment) (PRN)   Equipment Recommendations  Tub/shower seat    Recommendations for Other Services      Precautions / Restrictions Precautions Precautions: None Restrictions Weight Bearing Restrictions Per Provider Order: No       Mobility Bed Mobility               General bed mobility comments: EOB on entry    Transfers Overall transfer level: Independent Equipment used: None                     Balance Overall balance assessment: No apparent balance deficits (not formally assessed)                                         ADL either performed or assessed with clinical judgement   ADL Overall ADL's : Modified independent     Grooming: Modified independent   Upper Body Bathing: Modified independent   Lower Body Bathing: Modified independent   Upper Body Dressing : Independent   Lower Body Dressing: Modified independent Lower Body Dressing Details (indicate cue type and reason): did cue for propping LLE up on bed to allow easier reach for sock d/t difficulty with  figure four positioning with pt able to manage w/o issues               General ADL Comments: Pt just finished bathing; assessed dressing tasks as pt with recent difficulty with this. Discussed stretching, activity tolerance building and adaptations as needed    Extremity/Trunk Assessment Upper Extremity Assessment Upper Extremity Assessment: Overall WFL for tasks assessed;Right hand dominant   Lower Extremity Assessment Lower Extremity Assessment: Defer to PT evaluation        Vision   Vision Assessment?: Wears glasses for reading   Perception     Praxis     Communication Communication Communication: No apparent difficulties   Cognition Arousal: Alert Behavior During Therapy: WFL for tasks assessed/performed Cognition: No apparent impairments                               Following commands: Intact        Cueing   Cueing Techniques: Verbal cues, Gestural cues  Exercises      Shoulder Instructions       General Comments      Pertinent Vitals/ Pain       Pain Assessment Pain Assessment: Faces Faces Pain Scale: Hurts a little bit Pain Location: abdomen Pain Descriptors / Indicators: Discomfort, Guarding Pain Intervention(s): Monitored during session  Home Living  Prior Functioning/Environment              Frequency  Min 2X/week        Progress Toward Goals  OT Goals(current goals can now be found in the care plan section)  Progress towards OT goals: Progressing toward goals  Acute Rehab OT Goals Patient Stated Goal: continued recovery at home OT Goal Formulation: With patient Time For Goal Achievement: 11/03/24 Potential to Achieve Goals: Good ADL Goals Pt Will Perform Grooming: with modified independence;standing Pt Will Perform Lower Body Bathing: with modified independence;with adaptive equipment;sitting/lateral leans;sit to/from stand Pt Will Perform Lower  Body Dressing: with modified independence;with adaptive equipment;sitting/lateral leans;sit to/from stand Pt Will Transfer to Toilet: with modified independence;ambulating;regular height toilet;grab bars Pt Will Perform Toileting - Clothing Manipulation and hygiene: with modified independence;sitting/lateral leans;sit to/from stand Additional ADL Goal #1: Pt will recall and maintain 3 energy conservation/work simplification strategies during ADLs and functional mobility.  Plan      Co-evaluation                 AM-PAC OT 6 Clicks Daily Activity     Outcome Measure   Help from another person eating meals?: None Help from another person taking care of personal grooming?: None Help from another person toileting, which includes using toliet, bedpan, or urinal?: None Help from another person bathing (including washing, rinsing, drying)?: None Help from another person to put on and taking off regular upper body clothing?: None Help from another person to put on and taking off regular lower body clothing?: None 6 Click Score: 24    End of Session    OT Visit Diagnosis: Other abnormalities of gait and mobility (R26.89);Pain   Activity Tolerance Patient tolerated treatment well   Patient Left in bed;with call bell/phone within reach;with nursing/sitter in room   Nurse Communication Mobility status        Time: 9084-9068 OT Time Calculation (min): 16 min  Charges: OT General Charges $OT Visit: 1 Visit OT Treatments $Self Care/Home Management : 8-22 mins  Mliss NOVAK, OTR/L Acute Rehab Services Office: [phone]   Mliss Fish 10/29/2024, 9:52 AM

## 2024-10-29 NOTE — Plan of Care (Signed)
  Problem: Education: Goal: Understanding of discharge needs will improve Outcome: Adequate for Discharge Goal: Verbalization of understanding of the causes of altered bowel function will improve Outcome: Adequate for Discharge   Problem: Activity: Goal: Ability to tolerate increased activity will improve Outcome: Adequate for Discharge   Problem: Bowel/Gastric: Goal: Gastrointestinal status for postoperative course will improve Outcome: Adequate for Discharge   Problem: Clinical Measurements: Goal: Postoperative complications will be avoided or minimized Outcome: Adequate for Discharge   Problem: Respiratory: Goal: Respiratory status will improve Outcome: Adequate for Discharge   Problem: Skin Integrity: Goal: Will show signs of wound healing Outcome: Adequate for Discharge   Problem: Education: Goal: Knowledge of General Education information will improve Description: Including pain rating scale, medication(s)/side effects and non-pharmacologic comfort measures Outcome: Adequate for Discharge   Problem: Clinical Measurements: Goal: Ability to maintain clinical measurements within normal limits will improve Outcome: Adequate for Discharge Goal: Will remain free from infection Outcome: Adequate for Discharge Goal: Diagnostic test results will improve Outcome: Adequate for Discharge Goal: Respiratory complications will improve Outcome: Adequate for Discharge Goal: Cardiovascular complication will be avoided Outcome: Adequate for Discharge

## 2024-10-29 NOTE — Progress Notes (Signed)
 Reviewed AVS, patient expressed understanding of medications, MD follow up reviewed.   See LDA for information on wounds at discharge. Patient states all belongings brought to the hospital at time of admission are accounted for and packed to take home.  Picked up medications from [facility] pharmacy. Nursing Staff contacted to transport patient to entrance A, where family member was waiting in vehicle to transport home.

## 2024-10-29 NOTE — Discharge Summary (Signed)
 [facility] Discharge Summary   Patient ID: Christina Potts MRN: [id number] DOB/AGE: [birth year] 53 y.o.  Admit date: 10/19/2024 Discharge date: 10/29/2024  Admitting Diagnosis: Colectomy for diverticulitis  Discharge Diagnosis Patient Active Problem List   Diagnosis Date Noted   S/P partial colectomy 10/19/2024   Hypokalemia 07/26/2018   Perimenopausal vasomotor symptoms 07/26/2018   Cervical vertebral fusion 05/25/2018   S/P lumbar spinal fusion 09/21/2017    Consultants Urology--Dr. Cam for cystoscopy and firefly Imaging: No results found.  Procedures Laparoscopic partial colectomy with flexible sigmoidoscopy and cystoscopy with instillation of ureteral firefly contrast 11/21  Hospital Course:  Patient is a 53 year old female who was taken to OR on 11/21 for partial colectomy with flexible sigmoidoscopy and cystoscopy with firefly for diverticulitis. Intraoperatively diverticular disease in distal and sigmoid colon. Both ureters identified and preserved. Firefly showed good vascular supply to colorectal anastomosis and no evidence of leak with flexible sigmoidoscopy and leak test. Staple line ~20cm. Postoperative course complicated by bright red blood per rectum and drifting H/H, likely secondary to staple line oozing. This resolved by time of discharge and H/H stable. Patient had delayed contact dermatitis on abdomen and dermabond removed and discomfort alleviated with hydrocortisone  and lidocaine  cream/ointment. Patient tolerating low fiber diet and having bowel function at times of discharge.  Physical Exam: General:  Alert, NAD, pleasant, comfortable Abd:  Soft, nondistended. Appropriately tender to palpation. Erythema secondary to dermatitis around port sites. Improving rash throughout abdomen not erythematous. Some ecchymosis from lovenox  injection site.  I or a member of my team have reviewed this patient in the Controlled Substance  Database.   Allergies as of 10/29/2024       Reactions   Dilaudid [hydromorphone Hcl] Anaphylaxis, Shortness Of Breath   Patient reports 09/21/17 she tolerates oxycodone  fine   Strawberry (diagnostic) Anaphylaxis, Hives, Swelling   Morphine  And Codeine Rash, Other (See Comments)   Burns my skin  Pt states she can take codeine        Medication List     TAKE these medications    albuterol  108 (90 Base) MCG/ACT inhaler Commonly known as: VENTOLIN  HFA Inhale 2 puffs into the lungs every 4 (four) hours as needed for wheezing or shortness of breath.   amphetamine -dextroamphetamine  15 MG tablet Commonly known as: ADDERALL Take 15 mg by mouth 2 (two) times daily.   buPROPion  300 MG 24 hr tablet Commonly known as: WELLBUTRIN  XL Take 300 mg by mouth daily.   dicyclomine  10 MG capsule Commonly known as: BENTYL  Take 1 capsule (10 mg total) by mouth 4 (four) times daily -  before meals and at bedtime.   gabapentin  300 MG capsule Commonly known as: NEURONTIN  Take 1 capsule (300 mg total) by mouth 3 (three) times daily for 7 days.   hydrocortisone  cream 1 % Apply topically 4 (four) times daily.   hydrOXYzine  25 MG tablet Commonly known as: ATARAX  Take 1 tablet (25 mg total) by mouth every 8 (eight) hours as needed for up to 7 days for itching (.).   lidocaine  4 % external solution Commonly known as: XYLOCAINE  Apply topically 3 (three) times daily as needed for mild pain (pain score 1-3) (itching/ abdominal rash).   losartan -hydrochlorothiazide  50-12.5 MG tablet Commonly known as: HYZAAR Take 1 tablet by mouth at bedtime.   methocarbamol  750 MG tablet Commonly known as: ROBAXIN  Take 1 tablet (750 mg total) by mouth every 8 (eight) hours as needed for up to 10 days for muscle spasms.  MiraLax  17 g packet Generic drug: polyethylene glycol Take 17 g by mouth as needed for moderate constipation or mild constipation.   montelukast 10 MG tablet Commonly known as:  SINGULAIR Take 10 mg by mouth daily.   nystatin  100000 UNIT/ML suspension Commonly known as: MYCOSTATIN  Take 5 mLs (500,000 Units total) by mouth 4 (four) times daily.   ondansetron  8 MG disintegrating tablet Commonly known as: ZOFRAN -ODT Take 1 tablet (8 mg total) by mouth every 8 (eight) hours as needed for nausea or vomiting.   oxyCODONE  5 MG immediate release tablet Commonly known as: Oxy IR/ROXICODONE  Take 1-2 tablets (5-10 mg total) by mouth every 4 (four) hours as needed for severe pain (pain score 7-10), breakthrough pain or moderate pain (pain score 4-6).   Qnasl 80 MCG/ACT Aers Generic drug: Beclomethasone Dipropionate Place 2 sprays into both nostrils daily as needed (allergies).          Signed: Richerd Silversmith , MD [facility] 10/29/2024, 7:15 AM Please see Amion for pager number during day hours 7:00am-4:30pm
